# Patient Record
Sex: Male | Born: 1945 | Race: White | Hispanic: No | Marital: Married | Smoking: Never smoker
Health system: Southern US, Community
[De-identification: ages and names within clinical notes are randomized; demographics above are authoritative.]

## PROBLEM LIST (undated history)

## (undated) DIAGNOSIS — E785 Hyperlipidemia, unspecified: Secondary | ICD-10-CM

## (undated) DIAGNOSIS — I471 Supraventricular tachycardia, unspecified: Secondary | ICD-10-CM

## (undated) DIAGNOSIS — R053 Chronic cough: Secondary | ICD-10-CM

## (undated) DIAGNOSIS — R05 Cough: Secondary | ICD-10-CM

## (undated) DIAGNOSIS — Z86010 Personal history of colonic polyps: Secondary | ICD-10-CM

## (undated) DIAGNOSIS — Z950 Presence of cardiac pacemaker: Secondary | ICD-10-CM

## (undated) DIAGNOSIS — I10 Essential (primary) hypertension: Secondary | ICD-10-CM

## (undated) DIAGNOSIS — I2510 Atherosclerotic heart disease of native coronary artery without angina pectoris: Secondary | ICD-10-CM

## (undated) DIAGNOSIS — K219 Gastro-esophageal reflux disease without esophagitis: Secondary | ICD-10-CM

## (undated) DIAGNOSIS — I4891 Unspecified atrial fibrillation: Secondary | ICD-10-CM

## (undated) DIAGNOSIS — I219 Acute myocardial infarction, unspecified: Secondary | ICD-10-CM

## (undated) HISTORY — DX: Supraventricular tachycardia, unspecified: I47.10

## (undated) HISTORY — DX: Gastro-esophageal reflux disease without esophagitis: K21.9

## (undated) HISTORY — DX: Essential (primary) hypertension: I10

## (undated) HISTORY — DX: Cough: R05

## (undated) HISTORY — DX: Atherosclerotic heart disease of native coronary artery without angina pectoris: I25.10

## (undated) HISTORY — DX: Unspecified atrial fibrillation: I48.91

## (undated) HISTORY — DX: Supraventricular tachycardia: I47.1

## (undated) HISTORY — DX: Personal history of colonic polyps: Z86.010

## (undated) HISTORY — DX: Acute myocardial infarction, unspecified: I21.9

## (undated) HISTORY — DX: Hyperlipidemia, unspecified: E78.5

## (undated) HISTORY — DX: Chronic cough: R05.3

---
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
## 2004-05-23 ENCOUNTER — Ambulatory Visit: Payer: Self-pay | Admitting: Internal Medicine

## 2004-05-29 ENCOUNTER — Ambulatory Visit: Payer: Self-pay | Admitting: Internal Medicine

## 2006-02-18 ENCOUNTER — Encounter: Admission: RE | Admit: 2006-02-18 | Discharge: 2006-02-18 | Payer: Self-pay | Admitting: Family Medicine

## 2006-03-07 HISTORY — PX: CORONARY ARTERY BYPASS GRAFT: SHX141

## 2006-03-12 ENCOUNTER — Encounter: Admission: RE | Admit: 2006-03-12 | Discharge: 2006-03-12 | Payer: Self-pay | Admitting: Family Medicine

## 2006-04-01 ENCOUNTER — Ambulatory Visit: Payer: Self-pay | Admitting: Cardiology

## 2006-04-11 ENCOUNTER — Encounter: Payer: Self-pay | Admitting: Cardiology

## 2006-04-11 ENCOUNTER — Ambulatory Visit: Payer: Self-pay

## 2006-04-16 ENCOUNTER — Encounter: Admission: RE | Admit: 2006-04-16 | Discharge: 2006-04-16 | Payer: Self-pay | Admitting: Family Medicine

## 2006-05-01 ENCOUNTER — Encounter (HOSPITAL_COMMUNITY): Admission: RE | Admit: 2006-05-01 | Discharge: 2006-06-20 | Payer: Self-pay | Admitting: Cardiology

## 2006-05-13 ENCOUNTER — Ambulatory Visit: Payer: Self-pay | Admitting: Internal Medicine

## 2006-05-29 ENCOUNTER — Ambulatory Visit: Payer: Self-pay | Admitting: Cardiology

## 2006-05-29 ENCOUNTER — Ambulatory Visit: Payer: Self-pay | Admitting: Internal Medicine

## 2007-06-07 ENCOUNTER — Ambulatory Visit (HOSPITAL_COMMUNITY): Admission: RE | Admit: 2007-06-07 | Discharge: 2007-06-07 | Payer: Self-pay | Admitting: Emergency Medicine

## 2008-04-04 ENCOUNTER — Encounter: Admission: RE | Admit: 2008-04-04 | Discharge: 2008-04-04 | Payer: Self-pay | Admitting: Family Medicine

## 2008-04-14 ENCOUNTER — Ambulatory Visit: Payer: Self-pay | Admitting: Internal Medicine

## 2008-04-14 DIAGNOSIS — E785 Hyperlipidemia, unspecified: Secondary | ICD-10-CM | POA: Insufficient documentation

## 2008-04-14 DIAGNOSIS — J8289 Other pulmonary eosinophilia, not elsewhere classified: Secondary | ICD-10-CM | POA: Insufficient documentation

## 2008-04-14 DIAGNOSIS — J82 Pulmonary eosinophilia, not elsewhere classified: Secondary | ICD-10-CM

## 2008-04-14 DIAGNOSIS — E7800 Pure hypercholesterolemia, unspecified: Secondary | ICD-10-CM | POA: Insufficient documentation

## 2008-04-14 DIAGNOSIS — I213 ST elevation (STEMI) myocardial infarction of unspecified site: Secondary | ICD-10-CM | POA: Insufficient documentation

## 2008-04-15 LAB — CONVERTED CEMR LAB
HCT: 45.8 % (ref 39.0–52.0)
Lymphocytes Relative: 21.6 % (ref 12.0–46.0)
Monocytes Absolute: 0.9 10*3/uL (ref 0.1–1.0)
Monocytes Relative: 7.5 % (ref 3.0–12.0)
Neutro Abs: 7.4 10*3/uL (ref 1.4–7.7)
Neutrophils Relative %: 61.4 % (ref 43.0–77.0)
RBC: 5.03 M/uL (ref 4.22–5.81)
WBC: 12.1 10*3/uL — ABNORMAL HIGH (ref 4.5–10.5)

## 2008-04-28 ENCOUNTER — Ambulatory Visit: Payer: Self-pay | Admitting: Internal Medicine

## 2008-06-24 ENCOUNTER — Ambulatory Visit: Payer: Self-pay | Admitting: Internal Medicine

## 2008-06-24 DIAGNOSIS — J209 Acute bronchitis, unspecified: Secondary | ICD-10-CM | POA: Insufficient documentation

## 2008-07-18 ENCOUNTER — Ambulatory Visit (HOSPITAL_COMMUNITY): Admission: RE | Admit: 2008-07-18 | Discharge: 2008-07-18 | Payer: Self-pay | Admitting: Family Medicine

## 2008-08-04 ENCOUNTER — Telehealth: Payer: Self-pay | Admitting: Cardiology

## 2008-08-29 ENCOUNTER — Ambulatory Visit: Payer: Self-pay | Admitting: Cardiology

## 2008-08-29 ENCOUNTER — Ambulatory Visit: Payer: Self-pay

## 2008-08-29 DIAGNOSIS — R002 Palpitations: Secondary | ICD-10-CM | POA: Insufficient documentation

## 2008-08-29 DIAGNOSIS — I2510 Atherosclerotic heart disease of native coronary artery without angina pectoris: Secondary | ICD-10-CM | POA: Insufficient documentation

## 2008-11-01 ENCOUNTER — Ambulatory Visit: Payer: Self-pay | Admitting: Cardiology

## 2009-09-01 ENCOUNTER — Telehealth: Payer: Self-pay | Admitting: Cardiology

## 2009-09-04 ENCOUNTER — Telehealth: Payer: Self-pay | Admitting: Cardiology

## 2009-10-17 ENCOUNTER — Ambulatory Visit: Payer: Self-pay | Admitting: Cardiology

## 2009-12-11 ENCOUNTER — Ambulatory Visit: Payer: Self-pay | Admitting: Cardiology

## 2010-03-06 NOTE — Progress Notes (Signed)
Summary: pt needs refill toprol dj done  Phone Note Refill Request Call back at Home Phone [phone]  on right aide in [place] / [phone]  Refills Requested: Medication #1:  METOPROLOL SUCCINATE 50 MG XR24H-TAB one by mouth daily Initial call taken by: Omer Jack,  September 01, 2009 1:40 PM    Prescriptions: METOPROLOL SUCCINATE 50 MG XR24H-TAB (METOPROLOL SUCCINATE) one by mouth daily  #30 x 6   Entered by:   Burnett Kanaris, CNA   Authorized by:   Rollene Rotunda, MD, [facility]   Signed by:   Burnett Kanaris, CNA on 09/01/2009   Method used:   Electronically to        [facility]. [phone]* (retail)       [address]       [place], [place]  [address]       Ph: [phone]       Fax: [phone]   RxID:   [id number]

## 2010-03-06 NOTE — Progress Notes (Signed)
Summary: PAIN IN CHEST  Phone Note Call from Patient   Caller: Patient Reason for Call: Talk to Nurse Summary of Call: PT HAVE PAIN ON R SIDE OF CHEST X 2 DAYS, THOUGHT IT WAS A MUSCLE STRAIN, BUT FEELS DIFFERENT TODAY-PLS ADVISE [phone] Initial call taken by: Glynda Jaeger,  September 04, 2009 1:31 PM  Follow-up for Phone Call        chest pain over right breast felt like a pulled muscle, started a couple of days ago, sore to press on it, today started having sob, symptoms are constant, no bruise, no warmness, no injury, he states symptoms get worse w/certain movement and coughting, advised this does not sound cardiac and he should f/u w/pcp pt is agreeable Meredith Staggers, RN  September 04, 2009 1:58 PM

## 2010-03-06 NOTE — Assessment & Plan Note (Signed)
Summary: f1y/dm   Visit Type:  Follow-up Primary Keyona Emrich:  Dr. Dallas Schimke (Urgent Care)  CC:  CAD and palpitations.  History of Present Illness: The patient presents for followup. It has been one year since his last appointment. Since I saw him last he has done well. He does occasionally get tachypalpitations. He notices these at times such as when he's trying to golf. He feels his heart racing. This is the same symptoms we captured on an event monitor. At that time he was felt to have atrial tachycardia. He's not had any presyncope or syncope. He has not had any chest pressure, neck or arm discomfort. He has had no PND or orthopnea. He has had no weight gain or edema. He did stop his Lipitor when he ran out and didn't get her prescription renewed.  Current Medications (verified): 1)  Prevacid 24hr 15 Mg Cpdr (Lansoprazole) .... Take 1 Capsule By Mouth Once A Day 2)  Metoprolol Succinate 50 Mg Xr24h-Tab (Metoprolol Succinate) .... One By Mouth Daily  Allergies (verified): No Known Drug Allergies  Past History:  Past Medical History: Reviewed history from 11/01/2008 and no changes required. HYPERLIPIDEMIA (ICD-272.4) HEART ATTACK (ICD-410.90) Chronic cough..................................................................Marland KitchenWert    - onset 09/2007 Post op atrial fibrillation SVT (Event Monitor 08/2008)  Past Surgical History: Reviewed history from 11/01/2008 and no changes required. Heart Byapss surgery 03/2006 (status post CABG with a LIMA to the LAD,SVG to PDA, SVG to obtuse marginal)  Review of Systems       As stated in the HPI and negative for all other systems.   Vital Signs:  Patient profile:   65 year old male Height:      72 inches Weight:      206 pounds BMI:     28.04 Pulse rate:   62 / minute BP sitting:   104 / 70  Vitals Entered By: Laurance Flatten CMA (October 17, 2009 3:11 PM)  Physical Exam  General:  Well developed, well nourished, in no acute  distress. Head:  normocephalic and atraumatic Eyes:  PERRLA/EOM intact; conjunctiva and lids normal. Mouth:  Teeth, gums and palate normal. Oral mucosa normal. Neck:  Neck supple, no JVD. No masses, thyromegaly or abnormal cervical nodes. Chest Wall:  Well-healed sternotomy scar Lungs:  Clear bilaterally to auscultation and percussion. Abdomen:  Bowel sounds positive; abdomen soft and non-tender without masses, organomegaly, or hernias noted. No hepatosplenomegaly. Msk:  Back normal, normal gait. Muscle strength and tone normal. Extremities:  No clubbing or cyanosis. Neurologic:  Alert and oriented x 3. Skin:  Intact without lesions or rashes. Cervical Nodes:  no significant adenopathy Axillary Nodes:  no significant adenopathy Inguinal Nodes:  no significant adenopathy Psych:  Normal affect.   Detailed Cardiovascular Exam  Neck    Carotids: Carotids full and equal bilaterally without bruits.      Neck Veins: Normal, no JVD.    Heart    Inspection: no deformities or lifts noted.      Palpation: normal PMI with no thrills palpable.      Auscultation: regular rate and rhythm, S1, S2 without murmurs, rubs, gallops, or clicks.    Vascular    Abdominal Aorta: no palpable masses, pulsations, or audible bruits.      Femoral Pulses: normal femoral pulses bilaterally.      Pedal Pulses: normal pedal pulses bilaterally.      Radial Pulses: normal radial pulses bilaterally.      Peripheral Circulation: no clubbing, cyanosis, or edema noted with  normal capillary refill.     EKG  Procedure date:  10/17/2009  Findings:      Sinus rhythm, rate 62, right bundle branch block, no acute ST-T wave changes  Impression & Recommendations:  Problem # 1:  CORONARY ATHEROSCLEROSIS, NATIVE VESSEL (ICD-414.01) The patient is having no new symptoms.  I have asked him to start taking an aspirin 81 mg. No further testing is indicated but he needs more aggressive risk reduction. Toward that end I did  discuss exercise.  Problem # 2:  HYPERLIPIDEMIA (ICD-272.4) Hstopped his Lipitor. I will resume this. I will check a lipid and liver profile in 8 weeks. The goal being LDL less than 100 and HDL greater than 40.  Problem # 3:  PALPITATIONS (ICD-785.1) He does have these occasionally. I prescribed for him immediate release metoprolol to be taken p.r.n. If he is using is frequently he will let me know and I will adjust his medications or consider other evaluation. Orders: EKG w/ Interpretation (93000)  Patient Instructions: 1)  Your physician recommends that you schedule a follow-up appointment in: 1 yr with Dr Antoine Poche 2)  Your physician recommends that you return for a FASTING lipid ad liver profile: in 8 weeks 3)  Your physician has recommended you make the following change in your medication: Re-start Lipitor 20 mg  a day and take metoprolol tartrate 25 mg 1/2 to 1  tablet as needed for palpitations. Prescriptions: LIPITOR 20 MG TABS (ATORVASTATIN CALCIUM) ONE DAILY  #30 x 11   Entered by:   Charolotte Capuchin, RN   Authorized by:   Rollene Rotunda, MD, [facility]   Signed by:   Charolotte Capuchin, RN on 10/17/2009   Method used:   Electronically to        [facility]. [phone]* (retail)       [address]       [place], [place]  [address]       Ph: [phone]       Fax: [phone]   RxID:   [id number] METOPROLOL TARTRATE 25 MG TABS (METOPROLOL TARTRATE) 1/2 TO 1 TABLET AS NEEDED FOR PALPITATIONS  #30 x 6   Entered by:   Charolotte Capuchin, RN   Authorized by:   Rollene Rotunda, MD, [facility]   Signed by:   Charolotte Capuchin, RN on 10/17/2009   Method used:   Electronically to        [facility]. [phone]* (retail)       [address]       [place], [place]  [address]       Ph: [phone]       Fax: [phone]   RxID:   [id number]  I have reviewed and approved all prescriptions at the time of this  visit. Rollene Rotunda, MD, [facility]  October 17, 2009 4:14 PM

## 2010-04-30 ENCOUNTER — Other Ambulatory Visit: Payer: Self-pay | Admitting: Cardiology

## 2010-05-03 NOTE — Telephone Encounter (Signed)
[address] °

## 2010-05-05 ENCOUNTER — Other Ambulatory Visit: Payer: Self-pay

## 2010-05-05 DIAGNOSIS — I10 Essential (primary) hypertension: Secondary | ICD-10-CM

## 2010-05-05 MED ORDER — METOPROLOL SUCCINATE ER 50 MG PO TB24: ORAL_TABLET | ORAL | Status: DC

## 2010-06-03 ENCOUNTER — Other Ambulatory Visit: Payer: Self-pay | Admitting: Cardiology

## 2010-06-08 ENCOUNTER — Other Ambulatory Visit: Payer: Self-pay | Admitting: *Deleted

## 2010-06-22 NOTE — Assessment & Plan Note (Signed)
[facility]                         GASTROENTEROLOGY OFFICE NOTE   NAME:PEEPLESRenn, Dirocco                        MRN:          [id number]  DATE:05/13/2006                            DOB:          [birth year]    REASON FOR CONSULTATION:  Nausea, vomiting, and diarrhea, episodic.  Question relationship to recent myocardial infarction.   ASSESSMENT:  A 65 year old white man that had 3 spells of nausea,  vomiting, and diarrhea of sudden onset.  It occurred about every 2 weeks  or so, and on the last occasion he suffered a myocardial infarction and  underwent coronary artery bypass grafting.  They have not recurred  since.  Etiology has not been clear from workup so far.  A CT scan of  the abdomen and pelvis has shown some minor enlargement of lymph nodes,  not thought to be clinically significant, the anterior gastric wall was  thickened on 1 phase of the study, but it was not persistent.  His  father has a history of carcinoid metastatic to the liver.  His mother  had pancreatic cancer.  His brother had prostate cancer.  He has been  well in between these episodes as well.  There has been some weight loss  since his coronary artery bypass grafting.   It is not clear what the cause of these episodic symptoms are.  It seems  unlikely but not totally impossible that they were some form on angina.  I would have expected an inferior and not an anterior MI, and I think  the diarrhea is unusual.  So far, we have reassuring data, and he had a  colonoscopy performed by me May 29, 2004 that showed mixed hemorrhoids  but was otherwise unrevealing.   PLAN:  1. Upper GI endoscopy.  I think this would be reasonable to look for      any triggers of his nausea and vomiting, or a relationship to the      diarrhea.  We will perform this on all of his medicines.  Risks,      benefits, and indications are reviewed.  He understands and agrees      to proceed.  2. If that  is unrevealing, I would probably sit tight and observe at      this point, and he is comfortable with that plan.  3. He had not yet cut his amiodarone dose as advised by Dr. Antoine Poche,      so I discussed this with him to reduce to 200 mg daily.  4. He is using his Plavix somewhat intermittently, I suggested that he      stay on that on a daily basis at this point because Dr. Jenene Slicker      note did not indicate to take it intermittently, and that that is      unusual.  He should discuss the further use of that with Dr.      Antoine Poche at his followup visit.   History as above.  This man has had 3 episodes of nausea and vomiting  associated with diarrhea that were acute and relatively  severe, lasting  for a few hours.  He does not describe pain with that.  It really  started with nausea, vomiting, chills, diarrhea.  The first one was  after eating in a restaurant.  Six weeks later he had another one which  lasted for several hours, and then he had another episode and was  traveling to [place] on the highway on March 17, 2006, and developed a  spell, and went to the emergency room in [place], [place], where he  received thrombolytics and then went up to [facility] where he had coronary  artery bypass grafting.  He was on a balloon pump.  He had a 3-vessel  graft.  He was presumed to have been in atrial fibrillation since he was  discharged on amiodarone.  He has gotten along well since then.  He has  lost 10 pounds or so since that time.  He denies intermittent symptoms.  He has been placed on some iron for a mildly low hemoglobin.  He had the  CT scan as described above.  I have reviewed those findings.  Small  mesenteric nodes, thought likely to be reactive, some postoperative  changes in the pericardium, bilateral pars defects at L5, right hepatic  lobe cyst 5 mm.   Note, stools are somewhat dark on the iron and were not prior.  There  has been no evidence of bleeding.  GI, review of  systems, otherwise  negative.   PAST MEDICAL HISTORY:  1. Coronary artery bypass grafting, coronary artery disease with      myocardial infarction as above.  2. Question atrial fibrillation.  3. History of paroxysmal supraventricular tachycardia.  4. Dyslipidemia.  5. Hypertension.  6. Previous hernia repair.   MEDICATIONS:  1. Iron twice daily.  2. Amiodarone 400 mg daily.  3. Vitamin C daily.  4. Aspirin 81 mg daily.  5. Metoprolol 12.5 mg twice daily.  6. Potassium 20 mg daily.  7. Furosemide 20 mg daily.  8. Simvastatin 40 daily.  9. Advil p.r.n.  10.__________  juice intermittently.  11.Plavix intermittently.   DRUG ALLERGIES:  None known.   FAMILY HISTORY:  As summarized above.  His father did not have  adenocarcinoma of the colon, the patient indicates he had carcinoid of  the colon metastatic to the liver.   SOCIAL HISTORY:  Patient is divorced.  He is a Copywriter, advertising.  He lives  alone.  One son, one daughter.  Uses some alcohol, no tobacco or drugs.   REVIEW OF SYSTEMS:  As reflected in my medical history form, he  indicates  swollen lymph nodes as noted on his CT.   PHYSICAL EXAMINATION:  Reveals him to be 6 feet tall, weight 191.8  pounds, blood pressure 100/62, pulse 80 irregular.  EYES:  Anicteric.  LUNGS:  Clear.  NECK:  Supple, no thyromegaly or mass.  MOUTH:  Free of lesions.  CHEST WALL:  Notable for sternotomy scar, healing well, nontender.  ABDOMEN:  Soft, nontender, without organomegaly or masses.  EXTREMITIES:  Free of edema in the lower extremities.  SKIN:  Warm and dry, no acute rash in the upper trunk.  NEURO/PSYCH:  He is alert and oriented x3.  LYMPH NODES:  I detect no neck or supraclavicular nodes.   LABORATORY DATA:  From Dr. Geoffery Lyons office is reviewed, as well as CT  report in Dr. Geoffery Lyons office notes.  On March 11, he had a chloride  of 112, a sodium of 149, but otherwise normal  electrolytes. Triglycerides 158, HDL 33, hemoglobin  was 11.9 with low normal 12.5,  platelets 471, MCV is 94.  H. Pylori antibodies and amylase, lipase  normal.  TSH normal.  CBC, CMET, all normal.  In July of 2007, TSH  normal then.   Note, if he were to have persistent symptoms, I think workup for  carcinoid is reasonable, though I would expect a more chronic diarrhea.  We will see what the upper endoscopy shows.  I certainly think that the  stress of his nausea, vomiting, and diarrhea could have lead to  heightened inflammatory state in the body, and helped precipitate an  myocardial infarction.  It is hard to know, really, but that is  plausible.   I appreciate the opportunity to care for this patient.     Iva Boop, MD,FACG  Electronically Signed    CEG/MedQ  DD: 05/13/2006  DT: 05/13/2006  Job #: [id number]   cc:   Mosetta Putt, M.D.  Rollene Rotunda, MD, [facility]

## 2010-06-22 NOTE — Assessment & Plan Note (Signed)
[facility]                            CARDIOLOGY OFFICE NOTE   Steven Mathews, Steven Mathews                        MRN:          [id number]  DATE:04/01/2006                            DOB:          08/16/1945    PRIMARY CARE PHYSICIAN:  Mosetta Putt, M.D.   REASON FOR REFERRAL:  Evaluate patient with a recent anterior myocardial  infarction and bypass.   HISTORY OF PRESENT ILLNESS:  The patient is a pleasant 65 year old  gentleman without prior coronary disease history but a long history of  dyslipidemia.  He does have a history of paroxysmal supraventricular  tachycardia treated with verapamil.   He was in his state of usual health until March 14, 2006.  He was  driving to [place] and was in [place] when he developed an episode  of nausea, vomiting and diarrhea.  He had had these episodes a couple of  times over the previous 45 days; however, as he drove along he got worse  and developed chest and arm discomfort.  I do not have all of the  details, but he apparently pulled into a small emergency room and was  found to be having a myocardial infarction, apparently anterior.  He  reports being treated with thrombolytics.  He was then sent [facility].  He had a cardiac catheterization that demonstrated multivessel  disease with thrombus in the LAD and distal apical thrombus.  He  required a balloon pump.  I do not have all the details of his anatomy,  but do know that he underwent CABG on March 17, 2006 with a LIMA to  the LAD, SVG to the PDA and SVG to the obtuse marginal.  I do not have a  description of his ejection fraction.  He apparently was also found to  be in atrial fibrillation as he was discharged on amiodarone.   He now presents to establish with a cardiologist in this area.  He said  that since surgery he has done very well.  He has had none of the nausea  or vomiting.  He has had no diarrhea.  In particular, he has had none  of  the chest discomfort he had with his acute MI.  He has had no neck or  arm discomfort.  He has had no shortness of breath.  He denies any  cough.  He has had some slight chills, but not any documented fevers.  He has been eating well.  He has been walking.  He has not noticed any  palpitations, presyncope or syncope.   PAST MEDICAL HISTORY:  1. Hyperlipidemia.  2. Hypertension.  3. Newly-diagnosed coronary disease.  4. PSVT.   PAST SURGICAL HISTORY:  1. CABG.  2. Hernia repair.   ALLERGIES:  None.   CURRENT MEDICATIONS:  1. Percocet p.r.n.  2. Amiodarone 40 mg daily.  3. Ascorbic acid.  4. Aspirin 81 mg daily.  5. Iron.  6. Metoprolol 12.5 mg b.i.d.  7. Multivitamin.  8. Nitroglycerin.  9. Advil.  10.Plavix 75 mg daily.  11.Potassium 20 mEq daily.  12.Simvastatin  40 mg daily.  13.Lasix.   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  He has 2  adult children.  He was smoking a few cigarettes recently, but does not  have a long history.   FAMILY HISTORY:  Noncontributory for early coronary artery disease.   REVIEW OF SYSTEMS:  Positive for reflux and palpitations.  Otherwise, as  stated in the HPI.  Negative for other systems.   PHYSICAL EXAMINATION:  GENERAL:  The patient is in no distress.  VITAL SIGNS:  Blood pressure 105/59, heart rate 70 and regular, weight  190 pounds, body mass index 25.  HEENT:  Eyes unremarkable.  Pupils equal, round and reactive to light.  Fundi not visualized.  Oral mucosa unremarkable.  NECK:  No jugular venous distention at 45 degrees.  Carotid upstrokes  brisk and symmetric.  No bruits, no thyromegaly.  LYMPHATICS:  No cervical, axillary or inguinal adenopathy.  LUNGS:  Clear to auscultation bilaterally.  BACK:  No costovertebral angle tenderness.  CHEST:  Unremarkable, except for a well-healed sternotomy scar.  HEART:  PMI not displaced or sustained.  S1 and S2 within normal limits.  No S3, no S4, no clicks, rubs  or murmurs.  ABDOMEN:  Flat, positive bowel sounds.  Normal in frequency and pitch.  No bruits, rebound or guarding.  No midline pulse.  No mass.  No  hepatomegaly or splenomegaly.  SKIN:  No rashes, no nodules.  EXTREMITIES:  There were 2+ pulses throughout.  No edema, no cyanosis no  clubbing.  NEUROLOGIC:  Oriented to person, place, and time.  Cranial nerves II-XII  grossly intact.  Motor grossly intact.   ELECTROCARDIOGRAM:  Sinus rhythm, chronic right bundle branch block,  left axis deviation, anterolateral T wave inversions.   CHEST X-RAY:  Status post CABG with no acute airspace disease.   ASSESSMENT AND PLAN:  1. Coronary disease.  The patient is having no new symptoms.  He needs      aggressive risk reduction as described below.  I have referred him      to cardiac rehabilitation, which he is interested in doing.  He is      going to avoid cigarettes.  2. Risk reduction.  He will have his lipids followed by Dr. Duaine Dredge.      I would work aggressively with perhaps combination therapy as      needed to get his HDL into the 40s and his LDL less than 70, given      his absence of other significant risk factors.  3. Atrial fibrillation.  I suspect that he has had atrial      fibrillation, though I do not see documentation of this.  Will try      to get other records.  I am going to reduce his amiodarone to 200      mg a day.  Eventually will get rid of this if he has no other      symptomatic tachyarrhythmias.  I probably will try to use beta      blockers for paroxysmal supraventricular tachycardia, going      forward, given the added benefit post myocardial infarction.  4. Paroxysmal supraventricular tachycardia as above.  5. Medications.  I will discontinue his Lasix and his potassium.  He      has been given written instructions to get a BMET, lipid and liver      profile at Dr. Geoffery Lyons office in 2 weeks. 6.  Follow up.  I would like to see him back in 2 months, at which  time      I will most likely discontinue the amiodarone.     Rollene Rotunda, MD, [facility]  Electronically Signed    JH/MedQ  DD: 04/01/2006  DT: 04/01/2006  Job #: [id number]   cc:   Mosetta Putt, M.D.

## 2010-06-22 NOTE — Assessment & Plan Note (Signed)
[facility]                            CARDIOLOGY OFFICE NOTE   NAME:[name], Steven                        MRN:          [id number]  DATE:05/29/2006                            DOB:          [birth year]    REASON FOR PRESENTATION:  Evaluate patient with coronary artery disease.   HISTORY OF PRESENT ILLNESS:  Patient is a pleasant 65 year old gentleman  with coronary disease as described in the previous note.  He is status  post CABG in February.  He has done well since that time.  He has not  been able to go to cardiac rehab because of his schedule.  He made  through a couple of sessions.  He is trying to do exercising on his own.  He is quite active at work.  He denies any chest discomfort, neck or arm  discomfort.  He has had no palpitations, presyncope, or syncope.  He had  no PND or orthopnea.  Because of his previous GI complaints, he is  seeing Dr. Leone Payor, and is due to have an EGD today.  He thinks he  stopped taking the simvastatin, and does not have it on his list.  He  does not recall having any problems with it.   In particular, the patient has not had any palpitations, presyncope, or  syncope.  At the last visit, I reduced his amiodarone.   PAST MEDICAL HISTORY:  1. Coronary artery disease (status post CABG with a LIMA to the LAD,      SVG to PDA, SVG to obtuse marginal).  2. Hyperlipidemia.  3. Hypertension.  4. PSVT.  5. Hernia repair.   ALLERGIES:  NONE.   MEDICATIONS:  1. Iron.  2. Vitamin C.  3. Aspirin 81 mg daily.  4. Metoprolol 12.5 mg b.i.d.  5. Simvastatin 40 mg daily (he has not been taking this).  6. Amiodarone 200 mg daily.  7. Plavix 75 mg daily.   REVIEW OF SYSTEMS:  As stated in the HPI, and otherwise negative for  other systems.   PHYSICAL EXAMINATION:  The patient is in no distress.  Blood pressure 121/82.  Heart rate 72 and regular.  HEENT:  Eyes unremarkable.  Pupils equal, round, and reactive to  light.  Fundi not visualized. Oral mucosa unremarkable.  NECK:  No jugular venous distention.  Wave form within normal limits.  Carotid upstroke brisk and symmetric.  No bruits.  No thyromegaly.  LYMPHATICS:  No cervical, axillary, or inguinal adenopathy.  LUNGS:  Clear to auscultation bilaterally.  BACK:  No costovertebral angle tenderness.  CHEST:  Unremarkable.  HEART:  PMI not displaced or sustained.  S1 and S2 within normal limits.  No S3.  No S4.  No clicks, no rubs, no murmurs.  ABDOMEN:  Flat.  Positive bowel sounds.  Normal in frequency and pitch.  No bruits.  No rebound.  No guarding.  No midline pulsatile mass.  No  organomegaly.  SKIN:  No rashes.  No nodules.  EXTREMITIES:  Two plus pulses.  No edema.   ASSESSMENT AND PLAN:  1. Coronary artery disease.  Patient is having no new symptoms.  I      encouraged him to do cardiac rehab, or at least to do it at home.      We discussed the goals, and he will start his own exercise regimen      3 to 5 days per week.  I recommend 5.  He will continue with      secondary risk reduction.  He is going to switch to Toprol XL for      convenience.  2. Arrhythmia.  The patient is no longer having any symptomatic      arrhythmias.  I am going to stop his amiodarone.  If he has any      recurrence of his previous symptoms, we will probably increase his      beta blocker, or restart verapamil.  3. Dyslipidemia.  I have given him another prescription for his      simvastatin.  He understands that in 6 weeks he needs to go to Dr.      Duaine Dredge fasting, and get a lipid profile.  4. Followup.  I will see him back in 6 months, and probably yearly      thereafter as long as he is doing well.     Rollene Rotunda, MD, [facility]  Electronically Signed    JH/MedQ  DD: 05/29/2006  DT: 05/29/2006  Job #: [id number]   cc:   Mosetta Putt, M.D.

## 2010-07-03 ENCOUNTER — Other Ambulatory Visit: Payer: Self-pay | Admitting: Cardiology

## 2010-08-05 ENCOUNTER — Other Ambulatory Visit: Payer: Self-pay | Admitting: Cardiology

## 2010-08-22 ENCOUNTER — Telehealth: Payer: Self-pay | Admitting: Cardiology

## 2010-08-22 MED ORDER — ATORVASTATIN CALCIUM 20 MG PO TABS: 20.0000 mg | ORAL_TABLET | Freq: Every day | ORAL | Status: DC

## 2010-08-22 NOTE — Telephone Encounter (Signed)
Pt called and has been out of Lipitor for some time.  Please call a pres  Into the phar that is on file.  If questions call patient back.  He could not remember name of Phar.  [phone]

## 2010-09-06 ENCOUNTER — Encounter: Payer: Self-pay | Admitting: Cardiology

## 2010-10-05 ENCOUNTER — Ambulatory Visit (INDEPENDENT_AMBULATORY_CARE_PROVIDER_SITE_OTHER): Payer: Medicare Other | Admitting: Cardiology

## 2010-10-05 ENCOUNTER — Encounter: Payer: Self-pay | Admitting: Cardiology

## 2010-10-05 DIAGNOSIS — E785 Hyperlipidemia, unspecified: Secondary | ICD-10-CM

## 2010-10-05 DIAGNOSIS — I2510 Atherosclerotic heart disease of native coronary artery without angina pectoris: Secondary | ICD-10-CM

## 2010-10-05 DIAGNOSIS — N529 Male erectile dysfunction, unspecified: Secondary | ICD-10-CM | POA: Insufficient documentation

## 2010-10-05 DIAGNOSIS — R002 Palpitations: Secondary | ICD-10-CM

## 2010-10-05 MED ORDER — TADALAFIL 10 MG PO TABS: 10.0000 mg | ORAL_TABLET | Freq: Every day | ORAL | Status: DC | PRN

## 2010-10-05 NOTE — Assessment & Plan Note (Signed)
I will prescribe Cialis.

## 2010-10-05 NOTE — Progress Notes (Signed)
HPI The patient presents for evaluation of CAD.  Since I last saw him he has done well. The patient denies any new symptoms such as chest discomfort, neck or arm discomfort. There has been no new shortness of breath, PND or orthopnea. There has been no syncope.  He does have palpitations.  These occur almost daily. He describes irregular heartbeats it may last for sometime and makes him somewhat lightheaded. He does not associate this with any particular activity. He does think they're increasing in frequency.  He exercises one day per week.  No Known Allergies  Current Outpatient Prescriptions  Medication Sig Dispense Refill  . aspirin 81 MG tablet Take 81 mg by mouth daily.        Marland Kitchen atorvastatin (LIPITOR) 20 MG tablet Take 1 tablet (20 mg total) by mouth daily.  30 tablet  4  . lansoprazole (PREVACID) 15 MG capsule Take 15 mg by mouth daily.        . metoprolol (TOPROL-XL) 50 MG 24 hr tablet Take 1/2 to 1 whole tab daily as needed for palps  30 tablet  0    Past Medical History  Diagnosis Date  . Hyperlipidemia   . Heart attack   . Chronic cough   . Atrial fibrillation   . SVT (supraventricular tachycardia)     Past Surgical History  Procedure Date  . 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  Otherwise as stated in the HPI and negative for all other systems.  PHYSICAL EXAM BP 117/75  Pulse 66  Ht 6' (1.829 m)  Wt 198 lb (89.812 kg)  BMI 26.85 kg/m2 GENERAL:  Well appearing HEENT:  Pupils equal round and reactive, fundi not visualized, oral mucosa unremarkable NECK:  No jugular venous distention, waveform within normal limits, carotid upstroke brisk and symmetric, no bruits, no thyromegaly LYMPHATICS:  No cervical, inguinal adenopathy LUNGS:  Clear to auscultation bilaterally BACK:  No CVA tenderness CHEST:  Unremarkable HEART:  PMI not displaced or sustained,S1 and S2 within normal limits, no S3, no S4, no clicks, no rubs, no murmurs ABD:  Flat, positive bowel  sounds normal in frequency in pitch, no bruits, no rebound, no guarding, no midline pulsatile mass, no hepatomegaly, no splenomegaly EXT:  2 plus pulses throughout, no edema, no cyanosis no clubbing SKIN:  No rashes no nodules NEURO:  Cranial nerves II through XII grossly intact, motor grossly intact throughout PSYCH:  Cognitively intact, oriented to person place and time  EKG: Normal sinus rhythm, right bundle branch block, rate 66  ASSESSMENT AND PLAN

## 2010-10-05 NOTE — Patient Instructions (Addendum)
Your physician has recommended that you wear a holter monitor for  48 hours. Holter monitors are medical devices that record the heart's electrical activity. Doctors most often use these monitors to diagnose arrhythmias. Arrhythmias are problems with the speed or rhythm of the heartbeat. The monitor is a small, portable device. You can wear one while you do your normal daily activities. This is usually used to diagnose what is causing palpitations/syncope (passing out).  Please have a fasting lipid and liver profile the same day you come in for your monitor. Do not eat or drink afternoon the morning you come in.  Continue all medications as listed.  Follow up in 1 year with Dr Antoine Poche.  You will receive a letter in the mail 2 months before you are due.  Please call us when you receive this letter to schedule your follow up appointment.

## 2010-10-05 NOTE — Assessment & Plan Note (Signed)
I will try to have him wear a 48 hour monitor so that I can understand he is better. For now we will continue the meds as listed including p.r.n. Beta blocker.

## 2010-10-05 NOTE — Assessment & Plan Note (Signed)
The patient has no new sypmtoms.  No further cardiovascular testing is indicated.  We will continue with aggressive risk reduction and meds as listed.  

## 2010-10-05 NOTE — Assessment & Plan Note (Signed)
He was off Lipitor for awhile. He will come back for a fasting lipid profile. The goal will be an LDL less than 70 and HDL greater than 40.

## 2010-10-12 ENCOUNTER — Other Ambulatory Visit: Payer: Self-pay | Admitting: Cardiology

## 2010-10-12 DIAGNOSIS — N529 Male erectile dysfunction, unspecified: Secondary | ICD-10-CM

## 2010-10-12 MED ORDER — TADALAFIL 10 MG PO TABS: 10.0000 mg | ORAL_TABLET | Freq: Every day | ORAL | Status: AC | PRN

## 2010-12-25 ENCOUNTER — Other Ambulatory Visit: Payer: Self-pay | Admitting: Cardiology

## 2011-01-09 ENCOUNTER — Ambulatory Visit (INDEPENDENT_AMBULATORY_CARE_PROVIDER_SITE_OTHER): Payer: Medicare Other

## 2011-01-09 DIAGNOSIS — Z23 Encounter for immunization: Secondary | ICD-10-CM

## 2011-02-12 ENCOUNTER — Other Ambulatory Visit: Payer: Self-pay | Admitting: Cardiology

## 2011-08-09 ENCOUNTER — Other Ambulatory Visit: Payer: Self-pay | Admitting: Cardiology

## 2011-08-09 NOTE — Telephone Encounter (Signed)
-   refilled atorvastatin 

## 2011-09-11 ENCOUNTER — Other Ambulatory Visit: Payer: Self-pay | Admitting: Cardiology

## 2011-09-11 NOTE — Telephone Encounter (Signed)
..   Requested Prescriptions   Pending Prescriptions Disp Refills  . metoprolol succinate (TOPROL-XL) 50 MG 24 hr tablet [Pharmacy Med Name: METOPROLOL ER SUCCINATE 50MG  TABS] 30 tablet 6    Sig: TAKE 1/2 TO 1 TABLET BY MOUTH DAILY AS NEEDED FOR PALPATATIONS

## 2011-11-22 ENCOUNTER — Other Ambulatory Visit: Payer: Self-pay

## 2011-11-22 MED ORDER — ATORVASTATIN CALCIUM 20 MG PO TABS: 20.0000 mg | ORAL_TABLET | Freq: Every day | ORAL | Status: DC

## 2011-11-22 NOTE — Telephone Encounter (Signed)
..   Requested Prescriptions   Pending Prescriptions Disp Refills  . atorvastatin (LIPITOR) 20 MG tablet 30 tablet 6    Sig: Take 1 tablet (20 mg total) by mouth daily.

## 2012-04-13 ENCOUNTER — Other Ambulatory Visit: Payer: Self-pay | Admitting: Cardiology

## 2012-05-20 ENCOUNTER — Other Ambulatory Visit: Payer: Self-pay | Admitting: *Deleted

## 2012-05-20 MED ORDER — METOPROLOL SUCCINATE ER 50 MG PO TB24: ORAL_TABLET | ORAL | Status: DC

## 2012-06-12 ENCOUNTER — Ambulatory Visit (INDEPENDENT_AMBULATORY_CARE_PROVIDER_SITE_OTHER): Payer: Medicare Other | Admitting: Cardiology

## 2012-06-12 ENCOUNTER — Encounter: Payer: Self-pay | Admitting: Cardiology

## 2012-06-12 VITALS — BP 122/82 | HR 69 | Ht 72.0 in | Wt 211.8 lb

## 2012-06-12 DIAGNOSIS — I219 Acute myocardial infarction, unspecified: Secondary | ICD-10-CM

## 2012-06-12 DIAGNOSIS — I2510 Atherosclerotic heart disease of native coronary artery without angina pectoris: Secondary | ICD-10-CM

## 2012-06-12 DIAGNOSIS — Z79899 Other long term (current) drug therapy: Secondary | ICD-10-CM

## 2012-06-12 DIAGNOSIS — E7800 Pure hypercholesterolemia, unspecified: Secondary | ICD-10-CM

## 2012-06-12 DIAGNOSIS — R002 Palpitations: Secondary | ICD-10-CM

## 2012-06-12 MED ORDER — ATORVASTATIN CALCIUM 20 MG PO TABS: 20.0000 mg | ORAL_TABLET | Freq: Every day | ORAL | Status: DC

## 2012-06-12 MED ORDER — METOPROLOL SUCCINATE ER 50 MG PO TB24: ORAL_TABLET | ORAL | Status: DC

## 2012-06-12 NOTE — Progress Notes (Signed)
   HPI The patient presents for evaluation of CAD.  Since I last saw him he has had increasing palpitaitons.  These are happening almost every day. He'll feel in irregular heart rate for sometimes a couple of hours. He feels drained with this. He has not had any frank syncope or presyncope.  He does not associate this with any particular activity. The patient denies any new symptoms such as chest discomfort, neck or arm discomfort. There has been no new shortness of breath, PND or orthopnea.  He does exercise routinely.  No Known Allergies  Current Outpatient Prescriptions  Medication Sig Dispense Refill  . aspirin 81 MG tablet Take 81 mg by mouth daily.        Marland Kitchen atorvastatin (LIPITOR) 20 MG tablet Take 1 tablet (20 mg total) by mouth daily.  30 tablet  6  . lansoprazole (PREVACID) 15 MG capsule Take 15 mg by mouth daily.        . metoprolol succinate (TOPROL-XL) 50 MG 24 hr tablet Take 1/2 to 1 tablet by mouth daily as needed for palpitations  30 tablet  0   No current facility-administered medications for this visit.    Past Medical History  Diagnosis Date  . Hyperlipidemia   . Heart attack   . Chronic cough   . Atrial fibrillation   . SVT (supraventricular tachycardia)     Past Surgical History  Procedure Laterality Date  . Coronary artery bypass graft  03/2006    ROS:  As stated in the HPI and negative for all other systems.  PHYSICAL EXAM BP 122/82  Pulse 69  Ht 6' (1.829 m)  Wt 211 lb 12.8 oz (96.072 kg)  BMI 28.72 kg/m2 GENERAL:  Well appearing HEENT:  Pupils equal round and reactive, fundi not visualized, oral mucosa unremarkable NECK:  No jugular venous distention, waveform within normal limits, carotid upstroke brisk and symmetric, no bruits, no thyromegaly LYMPHATICS:  No cervical, inguinal adenopathy LUNGS:  Clear to auscultation bilaterally BACK:  No CVA tenderness CHEST: Well healed sternotomy scar. HEART:  PMI not displaced or sustained,S1 and S2 within  normal limits, no S3, no S4, no clicks, no rubs, no murmurs ABD:  Flat, positive bowel sounds normal in frequency in pitch, no bruits, no rebound, no guarding, no midline pulsatile mass, no hepatomegaly, no splenomegaly EXT:  2 plus pulses throughout, no edema, no cyanosis no clubbing SKIN:  No rashes no nodules NEURO:  Cranial nerves II through XII grossly intact, motor grossly intact throughout PSYCH:  Cognitively intact, oriented to person place and time  EKG: Normal sinus rhythm, right bundle branch block, rate 69  ASSESSMENT AND PLAN  CAD:  The patient has no new sypmtoms.  No further cardiovascular testing is indicated.  We will continue with aggressive risk reduction and meds as listed.  HTN:  The blood pressure is at target. No change in medications is indicated. We will continue with therapeutic lifestyle changes (TLC).  HYPERLIPIDEMIA:  I will check a lipid level.  He is not quite at target level of medicine but I would adjust this based on the upcoming reading.  PALPITATIONS:  He is having a consistent dysrhythmia and I will apply a 48 hour Holter monitor. Further management will be based on this.

## 2012-06-12 NOTE — Patient Instructions (Addendum)
The current medical regimen is effective;  continue present plan and medications.  Please return for fasting blood work. (lipid and liver)  Your physician has recommended that you wear a holter monitor for 48 hours. Holter monitors are medical devices that record the heart's electrical activity. Doctors most often use these monitors to diagnose arrhythmias. Arrhythmias are problems with the speed or rhythm of the heartbeat. The monitor is a small, portable device. You can wear one while you do your normal daily activities. This is usually used to diagnose what is causing palpitations/syncope (passing out).  Follow up with Dr Antoine Poche after testing.  (OK TO DOUBLE BOOK PER DR HOCHREIN)

## 2012-06-16 ENCOUNTER — Other Ambulatory Visit (INDEPENDENT_AMBULATORY_CARE_PROVIDER_SITE_OTHER): Payer: Medicare Other

## 2012-06-16 ENCOUNTER — Telehealth: Payer: Self-pay | Admitting: *Deleted

## 2012-06-16 ENCOUNTER — Encounter (INDEPENDENT_AMBULATORY_CARE_PROVIDER_SITE_OTHER): Payer: Medicare Other

## 2012-06-16 DIAGNOSIS — R002 Palpitations: Secondary | ICD-10-CM

## 2012-06-16 DIAGNOSIS — Z79899 Other long term (current) drug therapy: Secondary | ICD-10-CM

## 2012-06-16 DIAGNOSIS — E7800 Pure hypercholesterolemia, unspecified: Secondary | ICD-10-CM

## 2012-06-16 LAB — HEPATIC FUNCTION PANEL
ALT: 35 U/L (ref 0–53)
AST: 43 U/L — ABNORMAL HIGH (ref 0–37)
Bilirubin, Direct: 0.1 mg/dL (ref 0.0–0.3)
Total Bilirubin: 0.7 mg/dL (ref 0.3–1.2)

## 2012-06-16 LAB — LIPID PANEL: Triglycerides: 245 mg/dL — ABNORMAL HIGH (ref 0.0–149.0)

## 2012-06-16 NOTE — Telephone Encounter (Signed)
48 Hour Holter monitor placed on patient 06-16-2012. sw

## 2012-06-25 ENCOUNTER — Ambulatory Visit (INDEPENDENT_AMBULATORY_CARE_PROVIDER_SITE_OTHER): Payer: Medicare Other | Admitting: Cardiology

## 2012-06-25 ENCOUNTER — Encounter: Payer: Self-pay | Admitting: Cardiology

## 2012-06-25 VITALS — BP 124/81 | HR 68 | Ht 72.0 in | Wt 211.8 lb

## 2012-06-25 DIAGNOSIS — R002 Palpitations: Secondary | ICD-10-CM

## 2012-06-25 DIAGNOSIS — E785 Hyperlipidemia, unspecified: Secondary | ICD-10-CM

## 2012-06-25 DIAGNOSIS — I2510 Atherosclerotic heart disease of native coronary artery without angina pectoris: Secondary | ICD-10-CM

## 2012-06-25 DIAGNOSIS — I498 Other specified cardiac arrhythmias: Secondary | ICD-10-CM

## 2012-06-25 DIAGNOSIS — I471 Supraventricular tachycardia: Secondary | ICD-10-CM

## 2012-06-25 MED ORDER — METOPROLOL SUCCINATE ER 50 MG PO TB24: 50.0000 mg | ORAL_TABLET | Freq: Two times a day (BID) | ORAL | Status: DC

## 2012-06-25 MED ORDER — ATORVASTATIN CALCIUM 40 MG PO TABS: 40.0000 mg | ORAL_TABLET | Freq: Every day | ORAL | Status: DC

## 2012-06-25 NOTE — Progress Notes (Signed)
   HPI The patient presents for evaluation of CAD. At the last visit he was having increasing palpitaitons.  He has not had any frank syncope or presyncope.  He does not associate this with any particular activity. The patient denies any new symptoms such as chest discomfort, neck or arm discomfort. There has been no new shortness of breath, PND or orthopnea.  I placed a a 48-hour Holter which demonstrated recurrent supraventricular tachycardia. He also had some nonsustained wide complex tachycardia.  He reports getting this more frequently. He feels drained and lightheaded but has not had any presyncope or syncope. It can happen with activity but also happens at rest.  No Known Allergies  Current Outpatient Prescriptions  Medication Sig Dispense Refill  . aspirin 81 MG tablet Take 81 mg by mouth daily.        Marland Kitchen atorvastatin (LIPITOR) 20 MG tablet Take 1 tablet (20 mg total) by mouth daily.  30 tablet  11  . lansoprazole (PREVACID) 15 MG capsule Take 15 mg by mouth daily.        . metoprolol succinate (TOPROL-XL) 50 MG 24 hr tablet Take 1/2 to 1 tablet by mouth daily as needed for palpitations  30 tablet  11   No current facility-administered medications for this visit.    Past Medical History  Diagnosis Date  . Hyperlipidemia   . Heart attack   . Chronic cough   . Atrial fibrillation   . SVT (supraventricular tachycardia)     Past Surgical History  Procedure Laterality Date  . Coronary artery bypass graft  03/2006    ROS:  As stated in the HPI and negative for all other systems.  PHYSICAL EXAM BP 124/81  Pulse 68  Ht 6' (1.829 m)  Wt 211 lb 12.8 oz (96.072 kg)  BMI 28.72 kg/m2 GENERAL:  Well appearing NECK:  No jugular venous distention, waveform within normal limits, carotid upstroke brisk and symmetric, no bruits, no thyromegaly LUNGS:  Clear to auscultation bilaterally CHEST: Well healed sternotomy scar. HEART:  PMI not displaced or sustained,S1 and S2 within normal  limits, no S3, no S4, no clicks, no rubs, no murmurs ABD:  Flat, positive bowel sounds normal in frequency in pitch, no bruits, no rebound, no guarding, no midline pulsatile mass, no hepatomegaly, no splenomegaly EXT:  2 plus pulses throughout, no edema, no cyanosis no clubbing   ASSESSMENT AND PLAN  SVT:  Given the results of the monitor I will increase his metoprolol to 100 mg daily and schedule him to see EEG to consider ablation. I will check an echocardiogram.  CAD:  The patient has no new sypmtoms.  No further cardiovascular testing is indicated.  We will continue with aggressive risk reduction and meds as listed.  HTN:  The blood pressure is at target. No change in medications is indicated. We will continue with therapeutic lifestyle changes (TLC).  HYPERLIPIDEMIA:  His triglycerides were elevated when I drew his labs recently. His LDL was 114.  I will increase the Lipitor to 40 mg daily.

## 2012-06-25 NOTE — Patient Instructions (Addendum)
Please increase your Atorvastatin to 40 mg a day Increase your Metoprolol to 50 mg one twice a day. Continue all other medications as listed.  Your physician has requested that you have an echocardiogram. Echocardiography is a painless test that uses sound waves to create images of your heart. It provides your doctor with information about the size and shape of your heart and how well your heart's chambers and valves are working. This procedure takes approximately one hour. There are no restrictions for this procedure.  You have been referred to EP for evaluation of SVT.  Follow up with Dr Antoine Poche after seeing EP.

## 2012-07-07 ENCOUNTER — Ambulatory Visit (HOSPITAL_COMMUNITY): Payer: Medicare Other | Attending: Cardiology | Admitting: Radiology

## 2012-07-07 DIAGNOSIS — R002 Palpitations: Secondary | ICD-10-CM | POA: Insufficient documentation

## 2012-07-07 DIAGNOSIS — I2581 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: Secondary | ICD-10-CM

## 2012-07-07 NOTE — Progress Notes (Signed)
Echocardiogram performed.  

## 2012-07-16 ENCOUNTER — Ambulatory Visit (INDEPENDENT_AMBULATORY_CARE_PROVIDER_SITE_OTHER): Payer: Medicare Other | Admitting: Internal Medicine

## 2012-07-16 ENCOUNTER — Encounter: Payer: Self-pay | Admitting: Internal Medicine

## 2012-07-16 VITALS — BP 115/75 | HR 63 | Ht 72.0 in | Wt 207.0 lb

## 2012-07-16 DIAGNOSIS — I2510 Atherosclerotic heart disease of native coronary artery without angina pectoris: Secondary | ICD-10-CM

## 2012-07-16 DIAGNOSIS — R002 Palpitations: Secondary | ICD-10-CM

## 2012-08-04 ENCOUNTER — Encounter: Payer: Self-pay | Admitting: Internal Medicine

## 2012-08-04 NOTE — Assessment & Plan Note (Signed)
He has documented SVT. I have discussed the treatment options with the patient and the risks/benefits/goals/expectations of medical therapy and catheter ablation have been reviewed and wishes to continue medical therapy. If he has recurrent SVT, he would reconsider catheter ablation.

## 2012-08-04 NOTE — Progress Notes (Signed)
HPI Steven Mathews is referred by Dr. Antoine Poche for evaluation of SVT. He is a pleasant 67 yo man with a h/o CAD and HTN. He has had recurrent chest pressure and evaluation demonstrated SVT, which was prlonged and reproduced his symptoms. He has minimal if any palpitations. He has not had syncope. He was place on Toprol and his symptoms have improved. The patient denies dyspnea with exertion. No edema. His ECG during SVT demonstrated a narrow complex tachy at 170/min. No Known Allergies   Current Outpatient Prescriptions  Medication Sig Dispense Refill  . aspirin 81 MG tablet Take 81 mg by mouth daily.        Marland Kitchen atorvastatin (LIPITOR) 40 MG tablet Take 1 tablet (40 mg total) by mouth daily.  30 tablet  11  . lansoprazole (PREVACID) 15 MG capsule Take 15 mg by mouth daily.        . metoprolol succinate (TOPROL-XL) 50 MG 24 hr tablet Take 1 tablet (50 mg total) by mouth 2 (two) times daily.  60 tablet  11   No current facility-administered medications for this visit.     Past Medical History  Diagnosis Date  . Hyperlipidemia   . CAD (coronary artery disease)   . Chronic cough   . Atrial fibrillation   . SVT (supraventricular tachycardia)     ROS:   All systems reviewed and negative except as noted in the HPI.   Past Surgical History  Procedure Laterality Date  . Coronary artery bypass graft  03/2006     Family History  Problem Relation Age of Onset  . Liver cancer Father   . Cancer Mother     bladder cancer     History   Social History  . Marital Status: Married    Spouse Name: N/A    Number of Children: N/A  . Years of Education: N/A   Occupational History  . Not on file.   Social History Main Topics  . Smoking status: Never Smoker   . Smokeless tobacco: Not on file  . Alcohol Use: Yes     Comment: occasional  . Drug Use: Not on file  . Sexually Active: Not on file   Other Topics Concern  . Not on file   Social History Narrative  . No narrative on file      BP 115/75  Pulse 63  Ht 6' (1.829 m)  Wt 207 lb (93.895 kg)  BMI 28.07 kg/m2  Physical Exam:  Well appearing middle aged man, NAD HEENT: Unremarkable Neck:  7 cm JVD, no thyromegally Lungs:  Clear with no wheezes HEART:  Regular rate rhythm, no murmurs, no rubs, no clicks Abd:  soft, positive bowel sounds, no organomegally, no rebound, no guarding Ext:  2 plus pulses, no edema, no cyanosis, no clubbing Skin:  No rashes no nodules Neuro:  CN II through XII intact, motor grossly intact  EKG - NSR with no pre-excitation   Assess/Plan:

## 2012-08-04 NOTE — Assessment & Plan Note (Signed)
Her currently denies anginal symptoms. He will continue his current meds.

## 2012-08-11 ENCOUNTER — Encounter: Payer: Self-pay | Admitting: Cardiology

## 2012-08-11 ENCOUNTER — Ambulatory Visit (INDEPENDENT_AMBULATORY_CARE_PROVIDER_SITE_OTHER): Payer: Medicare Other | Admitting: Cardiology

## 2012-08-11 VITALS — BP 115/70 | HR 68 | Ht 72.0 in | Wt 211.0 lb

## 2012-08-11 DIAGNOSIS — R002 Palpitations: Secondary | ICD-10-CM

## 2012-08-11 DIAGNOSIS — I2510 Atherosclerotic heart disease of native coronary artery without angina pectoris: Secondary | ICD-10-CM

## 2012-08-11 NOTE — Patient Instructions (Addendum)
The current medical regimen is effective;  continue present plan and medications.  You will be contacted about scheduling your ablation with Dr Ladona Ridgel.  Follow up in 1 year with Dr Antoine Poche.  You will receive a letter in the mail 2 months before you are due.  Please call us when you receive this letter to schedule your follow up appointment.

## 2012-08-11 NOTE — Progress Notes (Signed)
   HPI The patient presents for evaluation of CAD. He was having increasing palpitations and documented SVT. I sent him to see Dr. Ladona Ridgel and they discussed ablation though the patient wanted to defer this at that time. He has been taking beta blockers.  However, he's been having fatigue with this. He still has his palpitations although they are less symptomatic than they were previously. He's not having any presyncope or syncope. He denies any chest pressure, neck or arm discomfort. He's had no weight gain or edema.   No Known Allergies  Current Outpatient Prescriptions  Medication Sig Dispense Refill  . aspirin 81 MG tablet Take 81 mg by mouth daily.        Marland Kitchen atorvastatin (LIPITOR) 40 MG tablet Take 1 tablet (40 mg total) by mouth daily.  30 tablet  11  . lansoprazole (PREVACID) 15 MG capsule Take 15 mg by mouth daily.        . metoprolol succinate (TOPROL-XL) 50 MG 24 hr tablet Take 1 tablet (50 mg total) by mouth 2 (two) times daily.  60 tablet  11   No current facility-administered medications for this visit.    Past Medical History  Diagnosis Date  . Hyperlipidemia   . CAD (coronary artery disease)   . Chronic cough   . Atrial fibrillation   . SVT (supraventricular tachycardia)     Past Surgical History  Procedure Laterality Date  . Coronary artery bypass graft  03/2006    ROS:  As stated in the HPI and negative for all other systems.  PHYSICAL EXAM BP 115/70  Pulse 68  Ht 6' (1.829 m)  Wt 211 lb (95.709 kg)  BMI 28.61 kg/m2 GENERAL:  Well appearing NECK:  No jugular venous distention, waveform within normal limits, carotid upstroke brisk and symmetric, no bruits, no thyromegaly LUNGS:  Clear to auscultation bilaterally CHEST: Well healed sternotomy scar. HEART:  PMI not displaced or sustained,S1 and S2 within normal limits, no S3, no S4, no clicks, no rubs, no murmurs ABD:  Flat, positive bowel sounds normal in frequency in pitch, no bruits, no rebound, no guarding,  no midline pulsatile mass, no hepatomegaly, no splenomegaly EXT:  2 plus pulses throughout, no edema, no cyanosis no clubbing   ASSESSMENT AND PLAN  SVT:  He will continue his current beta blocker. I sent a note to Dr. Ladona Ridgel to schedule his ablation.  CAD:  The patient has no new sypmtoms.  No further cardiovascular testing is indicated.  We will continue with aggressive risk reduction and meds as listed.  HTN:  The blood pressure is at target. No change in medications is indicated. We will continue with therapeutic lifestyle changes (TLC).  HYPERLIPIDEMIA:  His triglycerides were elevated when I drew his labs recently. His LDL was 114.  He is on a target dose of Lipitor.

## 2012-08-26 ENCOUNTER — Encounter (HOSPITAL_COMMUNITY): Payer: Self-pay | Admitting: Pharmacist

## 2012-08-27 ENCOUNTER — Encounter: Payer: Self-pay | Admitting: *Deleted

## 2012-08-27 ENCOUNTER — Other Ambulatory Visit: Payer: Self-pay | Admitting: *Deleted

## 2012-08-27 DIAGNOSIS — I471 Supraventricular tachycardia: Secondary | ICD-10-CM

## 2012-08-28 ENCOUNTER — Other Ambulatory Visit (INDEPENDENT_AMBULATORY_CARE_PROVIDER_SITE_OTHER): Payer: Medicare Other

## 2012-08-28 DIAGNOSIS — I471 Supraventricular tachycardia: Secondary | ICD-10-CM

## 2012-08-28 DIAGNOSIS — I498 Other specified cardiac arrhythmias: Secondary | ICD-10-CM

## 2012-08-28 LAB — CBC WITH DIFFERENTIAL/PLATELET
Basophils Absolute: 0 10*3/uL (ref 0.0–0.1)
Eosinophils Absolute: 0.3 10*3/uL (ref 0.0–0.7)
Lymphocytes Relative: 32.8 % (ref 12.0–46.0)
MCHC: 33.5 g/dL (ref 30.0–36.0)
Monocytes Relative: 8.9 % (ref 3.0–12.0)
Neutrophils Relative %: 52.8 % (ref 43.0–77.0)
RBC: 4.41 Mil/uL (ref 4.22–5.81)
RDW: 13 % (ref 11.5–14.6)

## 2012-08-28 LAB — BASIC METABOLIC PANEL
CO2: 26 mEq/L (ref 19–32)
Calcium: 9.6 mg/dL (ref 8.4–10.5)
Creatinine, Ser: 1.2 mg/dL (ref 0.4–1.5)
GFR: 65.25 mL/min (ref >60.00)
Glucose, Bld: 110 mg/dL — ABNORMAL HIGH (ref 70–99)

## 2012-09-03 ENCOUNTER — Ambulatory Visit (HOSPITAL_COMMUNITY)
Admission: RE | Admit: 2012-09-03 | Discharge: 2012-09-03 | Disposition: A | Discharge status: Home / Self Care | Payer: Medicare Other | Source: Ambulatory Visit | Attending: Internal Medicine | Admitting: Internal Medicine

## 2012-09-03 ENCOUNTER — Encounter (HOSPITAL_COMMUNITY)
Admission: RE | Disposition: A | Discharge status: Home / Self Care | Payer: Self-pay | Source: Ambulatory Visit | Attending: Internal Medicine

## 2012-09-03 DIAGNOSIS — I471 Supraventricular tachycardia, unspecified: Secondary | ICD-10-CM | POA: Diagnosis present

## 2012-09-03 DIAGNOSIS — Z951 Presence of aortocoronary bypass graft: Secondary | ICD-10-CM | POA: Insufficient documentation

## 2012-09-03 DIAGNOSIS — I498 Other specified cardiac arrhythmias: Secondary | ICD-10-CM | POA: Insufficient documentation

## 2012-09-03 DIAGNOSIS — I219 Acute myocardial infarction, unspecified: Secondary | ICD-10-CM

## 2012-09-03 DIAGNOSIS — I2510 Atherosclerotic heart disease of native coronary artery without angina pectoris: Secondary | ICD-10-CM | POA: Insufficient documentation

## 2012-09-03 DIAGNOSIS — I441 Atrioventricular block, second degree: Secondary | ICD-10-CM | POA: Insufficient documentation

## 2012-09-03 DIAGNOSIS — E785 Hyperlipidemia, unspecified: Secondary | ICD-10-CM | POA: Insufficient documentation

## 2012-09-03 HISTORY — PX: SUPRAVENTRICULAR TACHYCARDIA ABLATION: SHX5492

## 2012-09-03 SURGERY — SUPRAVENTRICULAR TACHYCARDIA ABLATION
Anesthesia: LOCAL

## 2012-09-03 MED ORDER — ACETAMINOPHEN 325 MG PO TABS: 650.0000 mg | ORAL_TABLET | ORAL | Status: DC | PRN

## 2012-09-03 MED ORDER — LIDOCAINE HCL (PF) 1 % IJ SOLN
INTRAMUSCULAR | Status: AC
  Filled 2012-09-03: qty 60

## 2012-09-03 MED ORDER — MIDAZOLAM HCL 5 MG/5ML IJ SOLN
INTRAMUSCULAR | Status: AC
  Filled 2012-09-03: qty 5

## 2012-09-03 MED ORDER — SODIUM CHLORIDE 0.9 % IJ SOLN: 3.0000 mL | Freq: Two times a day (BID) | INTRAMUSCULAR | Status: DC

## 2012-09-03 MED ORDER — SODIUM CHLORIDE 0.9 % IV SOLN: 250.0000 mL | INTRAVENOUS | Status: DC | PRN

## 2012-09-03 MED ORDER — ONDANSETRON HCL 4 MG/2ML IJ SOLN: 4.0000 mg | Freq: Four times a day (QID) | INTRAMUSCULAR | Status: DC | PRN

## 2012-09-03 MED ORDER — ATORVASTATIN CALCIUM 20 MG PO TABS
20.0000 mg | ORAL_TABLET | Freq: Every day | ORAL | Status: DC
  Filled 2012-09-03: qty 1

## 2012-09-03 MED ORDER — FENTANYL CITRATE 0.05 MG/ML IJ SOLN
INTRAMUSCULAR | Status: AC
  Filled 2012-09-03: qty 2

## 2012-09-03 MED ORDER — BUPIVACAINE HCL (PF) 0.25 % IJ SOLN
INTRAMUSCULAR | Status: AC
  Filled 2012-09-03: qty 60

## 2012-09-03 MED ORDER — SODIUM CHLORIDE 0.9 % IJ SOLN: 3.0000 mL | INTRAMUSCULAR | Status: DC | PRN

## 2012-09-03 MED ORDER — ASPIRIN EC 81 MG PO TBEC
81.0000 mg | DELAYED_RELEASE_TABLET | Freq: Every day | ORAL | Status: DC
  Filled 2012-09-03: qty 1

## 2012-09-03 NOTE — Discharge Summary (Cosign Needed)
    ELECTROPHYSIOLOGY PROCEDURE DISCHARGE SUMMARY    Patient ID: Steven Mathews,  MRN: [id number], DOB/AGE: [birth year] 67 y.o.  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  Coronary artery disease status post CABG 2.  Hyperlipidemia   Procedures This Admission: 1.  Electrophysiology study and radiofrequency catheter ablation on 09-03-2012 by Dr Ladona Ridgel.  See his dictated op note for full details.  AVNRT was successfully ablated with no early apparent complications.  The patient was noted to have some bradycardia and Mobitz I heart block post procedure and so his Toprol was discontinued.   Brief HPI: Steven Mathews is a 67 year old male with a long time history of tachycardia that has been refractory to medical therapy.  He was referred for consideration of ablation. Risks, benefits, and alternatives were reviewed with the patient who wished to proceed.    Hospital Course:  The patient was admitted and underwent ablation by Dr Ladona Ridgel with details as outlined in his op note.  He did have post op Mobitz I heart block and bradycardia and so his Toprol was discontinued.  He had no early apparent complications.  He completed his bedrest and was ambulating without difficulty prior to discharge. Dr Ladona Ridgel considered him stable for discharge to home.   Discharge Vitals: Blood pressure 118/78, pulse 78, temperature 97.6 F (36.4 C), temperature source Oral, resp. rate 16, height 6\' 2"  (1.88 m), weight 205 lb (92.987 kg), SpO2 98.00%.   Labs:   Lab Results  Component Value Date   WBC 6.7 08/28/2012   HGB 13.9 08/28/2012   HCT 41.5 08/28/2012   MCV 94.0 08/28/2012   PLT 215.0 08/28/2012     Recent Labs Lab 08/28/12 1031  NA 137  K 4.5  CL 103  CO2 26  BUN 17    CREATININE 1.2  CALCIUM 9.6  GLUCOSE 110*    Discharge Medications:    Medication List    STOP taking these medications       metoprolol succinate 50 MG 24 hr tablet  Commonly known as:  TOPROL-XL      TAKE these medications       aspirin EC 81 MG tablet  Take 81 mg by mouth daily.     atorvastatin 20 MG tablet  Commonly known as:  LIPITOR  Take 20 mg by mouth daily.     omeprazole 20 MG capsule  Commonly known as:  PRILOSEC  Take 20 mg by mouth daily.        Disposition:  Discharge Orders   Future Appointments Provider Department Dept Phone   10/13/2012 11:30 AM Marinus Maw, MD [facility] Main Office [place]) [phone]   Future Orders Complete By Expires     Diet - low sodium heart healthy  As directed     Increase activity slowly  As directed         Duration of Discharge Encounter: Greater than 30 minutes including physician time.  Signed, Gypsy Balsam, RN, BSN 09/03/2012, 4:26 PM

## 2012-09-03 NOTE — CV Procedure (Signed)
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 B#[id number].

## 2012-09-03 NOTE — H&P (Signed)
HPI [name] is referred by Dr. Antoine Poche for CATHETER ablation of SVT. He is a pleasant 67 yo man with a h/o CAD and HTN. He has had recurrent chest pressure and evaluation demonstrated SVT, which was prlonged and reproduced his symptoms. He has minimal if any palpitations. He has not had syncope. He was place on Toprol and his symptoms have improved. The patient denies dyspnea with exertion. No edema. His ECG during SVT demonstrated a narrow complex tachy at 170/min. No Known Allergies      Current Outpatient Prescriptions   Medication  Sig  Dispense  Refill   .  aspirin 81 MG tablet  Take 81 mg by mouth daily.           Marland Kitchen  atorvastatin (LIPITOR) 40 MG tablet  Take 1 tablet (40 mg total) by mouth daily.   30 tablet   11   .  lansoprazole (PREVACID) 15 MG capsule  Take 15 mg by mouth daily.           .  metoprolol succinate (TOPROL-XL) 50 MG 24 hr tablet  Take 1 tablet (50 mg total) by mouth 2 (two) times daily.   60 tablet   11       No current facility-administered medications for this visit.           Past Medical History   Diagnosis  Date   .  Hyperlipidemia     .  CAD (coronary artery disease)     .  Chronic cough     .  Atrial fibrillation     .  SVT (supraventricular tachycardia)          ROS:    All systems reviewed and negative except as noted in the HPI.      Past Surgical History   Procedure  Laterality  Date   .  Coronary artery bypass graft    03/2006           Family History   Problem  Relation  Age of Onset   .  Liver cancer  Father     .  Cancer  Mother         bladder cancer           History       Social History   .  Marital Status:  Married       Spouse Name:  N/A       Number of Children:  N/A   .  Years of Education:  N/A       Occupational History   .  Not on file.       Social History Main Topics   .  Smoking status:  Never Smoker    .  Smokeless tobacco:  Not on file   .  Alcohol Use:  Yes         Comment:  occasional   .  Drug Use:  Not on file   .  Sexually Active:  Not on file       Other Topics  Concern   .  Not on file       Social History Narrative   .  No narrative on file          BP 115/75  Pulse 63  Ht 6' (1.829 m)  Wt 207 lb (93.895 kg)  BMI 28.07 kg/m2   Physical Exam:   Well appearing middle aged man, NAD HEENT: Unremarkable Neck:  7 cm  JVD, no thyromegally Lungs:  Clear with no wheezes HEART:  Regular rate rhythm, no murmurs, no rubs, no clicks Abd:  soft, positive bowel sounds, no organomegally, no rebound, no guarding Ext:  2 plus pulses, no edema, no cyanosis, no clubbing Skin:  No rashes no nodules Neuro:  CN II through XII intact, motor grossly intact   EKG - NSR with no pre-excitation     Assess/Plan:         PALPITATIONS -He has documented SVT. I have discussed the treatment options with the patient and the risks/benefits/goals/expectations of medical therapy and catheter ablation have been reviewed and wishes to continue medical therapy. He has recurrent SVT, he wishes to proceed with catheter ablation.         CORONARY ATHEROSCLEROSIS, NATIVE VESSEL - Steven Maw, MD at 08/04/2012 12:24 PM    Status: Written Related Problem: CORONARY ATHEROSCLEROSIS, NATIVE VESSEL           Her currently denies anginal symptoms. He will continue his current meds.       Steven Mathews.

## 2012-09-04 NOTE — Op Note (Signed)
[place], HETZ NO.:  [id number]  MEDICAL RECORD NO.:  [id number]  LOCATION:  3W41C                        FACILITY:  [facility]  PHYSICIAN:  Doylene Canning. 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  INTRODUCTION:  The patient is a very pleasant 67 year old man with a longstanding history of tachy palpitations and documented SVT at rates of 130-140 beats per minute.  He notes that he has had heart racing like this for over 40 years.  He was placed on a beta-blocker and noted that his heart racing slowed in to the 130 range, but became almost __________.  He is now referred for catheter ablation.  PROCEDURE:  After informed consent was obtained, the patient was taken to the diagnostic EP lab in a fasting state.  After usual preparation and draping, intravenous fentanyl and midazolam was given for sedation. A 6-French hexapolar catheter was inserted percutaneously in the right jugular vein and advanced to the coronary sinus.  A 6-French quadripolar catheter was inserted percutaneously in the right femoral vein and advanced to the right ventricle.  A 6-French quadripolar catheter was inserted percutaneously in the right femoral vein and advanced to his bundle region.  After measurement of basic intervals, rapid ventricular pacing was carried out the right ventricle demonstrating VA Wenckebach at 400 milliseconds.  During rapid ventricular pacing, the atrial activation sequence was midline __________.  Next, programed ventricular stimulation was carried out from the right ventricle paced at 500 milliseconds.  The S1-S2 interval was stepwise decreased from 440 milliseconds down to 320 milliseconds with retrograde AV node ERP was observed.  Ventricular  stimulation, the atrial activation was midline and decremental.  Next, programed atrial stimulation was carried out __________ 600 milliseconds.  The S1-S2 interval was stepwise decreased 540 milliseconds down to 520 milliseconds where the AV node ERP was observed.  During programmed atrial stimulation, there were no AH jumps and no echo beats.  Next, rapid atrial pacing was carried out from the coronary sinus __________ 590 milliseconds __________ AV Wenckebach was observed.  During rapid atrial pacing, __________ 480 milliseconds, there was inducible SVT as well.  The PR interval was greater than __________ the atrial activation was midline and the VA interval was very short.  __________ tachycardia was over 500 milliseconds.  PVCs __________.  Ventricular pacing __________ tachycardia demonstrated termination of the tachycardia.  With all of the above, the diagnosis of AV node reentrant tachycardia was made.  It was noted, however, __________ conduction system was not particularly __________.  The 7- French quadripolar ablation catheter was then maneuvered into the region of Koch triangle __________ rhythm demonstrated.  __________ there was persistent junctional rhythm.  There was a single __________ discontinued and the catheter was __________ moved back into the inferior vena cava.  At this point, the patient was observed for approximately 40 minutes.  There was no inducible SVT.  Slow pathway conduction was removed.  The patient did have spontaneous AV Wenckebach during  this period and __________ and pacing was carried out, it demonstrated AV Wenckebach __________ approximately 600 milliseconds with the patient's __________.  The catheters were removed.  Hemostasis was assured and the patient was returned to his room in satisfactory condition.  COMPLICATIONS:  There were no immediate procedure complications.  RESULTS:  A.  Baseline ECG.  Baseline EGG demonstrates sinus rhythm  with right bundle-branch block. B.  Baseline intervals.  Sinus node cycle length was 765 milliseconds. The QRS duration __________ the HV interval was 46 milliseconds. C.  Rapid ventricular pacing.  Rapid __________ milliseconds.  Rapid ventricular pacing, the atrial activation was midline __________. D.  Programed ventricular stimulation.  Programed ventricular stimulation was carried out in the right ventricle at a base drive cycle length of 119 milliseconds.  S1-S2 interval stepwise decreased down to 340 milliseconds where the retrograde AV node ERP was observed. E.  __________ F.  Rapid atrial pacing.  Rapid atrial pacing was carried out __________ 600 milliseconds and stepwise decreased down to 480 milliseconds __________.  The PR interval was __________. G. Arrhythmias observed. 1. AV nodal reentrant tachycardia.  __________ with rapid atrial     pacing.  The duration was sustained, cycle length was approximately     505 milliseconds.  Method of termination was with rapid pacing.     __________ spontaneously.     a.     Mapping.  Mapping of __________ demonstrated __________ were      delivered __________ demonstrating accelerated junctional rhythm.      There was a single retrograde block __________.  There was      intermittent AV Wenckebach of uncertain clinical significance.     Doylene Canning. Ladona Ridgel, MD     GWT/MEDQ  D:  09/03/2012  T:  09/03/2012  Job:  [id number]

## 2012-10-06 ENCOUNTER — Encounter: Payer: Self-pay | Admitting: Internal Medicine

## 2012-10-06 ENCOUNTER — Ambulatory Visit (INDEPENDENT_AMBULATORY_CARE_PROVIDER_SITE_OTHER): Payer: Medicare Other | Admitting: Internal Medicine

## 2012-10-06 VITALS — BP 144/88 | HR 84 | Ht 72.0 in | Wt 207.4 lb

## 2012-10-06 DIAGNOSIS — I471 Supraventricular tachycardia: Secondary | ICD-10-CM

## 2012-10-06 DIAGNOSIS — I219 Acute myocardial infarction, unspecified: Secondary | ICD-10-CM

## 2012-10-06 DIAGNOSIS — I498 Other specified cardiac arrhythmias: Secondary | ICD-10-CM

## 2012-10-06 NOTE — Patient Instructions (Addendum)
Your physician wants you to follow-up in: 6 months with Dr Taylor You will receive a reminder letter in the mail two months in advance. If you don't receive a letter, please call our office to schedule the follow-up appointment.  

## 2012-10-12 ENCOUNTER — Telehealth: Payer: Self-pay

## 2012-10-12 NOTE — Telephone Encounter (Signed)
Patient wants to know when he had last tetanus shot. Please call back at [phone]

## 2012-10-13 ENCOUNTER — Encounter: Payer: Medicare Other | Admitting: Internal Medicine

## 2012-10-13 NOTE — Telephone Encounter (Signed)
Patient had T-dap August 05, 2007. Asked if he needed a copy and he stated he did not.

## 2012-10-14 ENCOUNTER — Encounter: Payer: Self-pay | Admitting: Internal Medicine

## 2012-10-14 NOTE — Assessment & Plan Note (Signed)
He is s/p catheter ablation and has had no recurrent symptomatic arrhythmias. No change in medical therapy.

## 2012-10-14 NOTE — Progress Notes (Signed)
HPI Steven Mathews returns today for followup. He is a pleasant 67 yo man with a h/o SVT who underwent EP study and catheter ablation of AVNRT several weeks ago. In the interim, he has been stable. He denies chest pain or sob. No syncope or recurrent episodes of tachycardia No Known Allergies   Current Outpatient Prescriptions  Medication Sig Dispense Refill  . aspirin EC 81 MG tablet Take 81 mg by mouth daily.      Marland Kitchen atorvastatin (LIPITOR) 20 MG tablet Take 20 mg by mouth daily.      Marland Kitchen omeprazole (PRILOSEC) 20 MG capsule Take 20 mg by mouth daily.       No current facility-administered medications for this visit.     Past Medical History  Diagnosis Date  . Hyperlipidemia   . CAD (coronary artery disease)   . Chronic cough   . Atrial fibrillation   . SVT (supraventricular tachycardia)     ROS:   All systems reviewed and negative except as noted in the HPI.   Past Surgical History  Procedure Laterality Date  . Coronary artery bypass graft  03/2006     Family History  Problem Relation Age of Onset  . Liver cancer Father   . Cancer Mother     bladder cancer     History   Social History  . Marital Status: Married    Spouse Name: N/A    Number of Children: N/A  . Years of Education: N/A   Occupational History  . Not on file.   Social History Main Topics  . Smoking status: Never Smoker   . Smokeless tobacco: Not on file  . Alcohol Use: Yes     Comment: occasional  . Drug Use: Not on file  . Sexual Activity: Not on file   Other Topics Concern  . Not on file   Social History Narrative  . No narrative on file     BP 144/88  Pulse 84  Ht 6' (1.829 m)  Wt 207 lb 6.4 oz (94.076 kg)  BMI 28.12 kg/m2  Physical Exam:  Well appearing NAD HEENT: Unremarkable Neck:  No JVD, no thyromegally Lymphatics:  No adenopathy Back:  No CVA tenderness Lungs:  Clear HEART:  Regular rate rhythm, no murmurs, no rubs, no clicks Abd:  soft, positive bowel sounds, no  organomegally, no rebound, no guarding Ext:  2 plus pulses, no edema, no cyanosis, no clubbing Skin:  No rashes no nodules Neuro:  CN II through XII intact, motor grossly intact  EKG NSR with no pre-excitation.  Assess/Plan:

## 2013-01-15 ENCOUNTER — Telehealth: Payer: Self-pay | Admitting: Internal Medicine

## 2013-01-15 NOTE — Telephone Encounter (Signed)
Left message on machine for patient to return my call 

## 2013-01-15 NOTE — Telephone Encounter (Signed)
New Message  Pt called states that he has experienced an irregular heartbeat// requests a call back to discuss.

## 2013-01-25 NOTE — Telephone Encounter (Signed)
Left message on his voicemail to call me if he needs me.  This is the second time I have left him a message to return my call

## 2013-02-09 ENCOUNTER — Ambulatory Visit (INDEPENDENT_AMBULATORY_CARE_PROVIDER_SITE_OTHER): Payer: Medicare Other | Admitting: Internal Medicine

## 2013-02-09 ENCOUNTER — Encounter: Payer: Self-pay | Admitting: Internal Medicine

## 2013-02-09 ENCOUNTER — Encounter (INDEPENDENT_AMBULATORY_CARE_PROVIDER_SITE_OTHER): Payer: Medicare Other

## 2013-02-09 ENCOUNTER — Encounter (INDEPENDENT_AMBULATORY_CARE_PROVIDER_SITE_OTHER): Payer: Self-pay

## 2013-02-09 ENCOUNTER — Encounter: Payer: Self-pay | Admitting: *Deleted

## 2013-02-09 VITALS — BP 136/79 | HR 89 | Ht 72.0 in | Wt 193.8 lb

## 2013-02-09 DIAGNOSIS — R002 Palpitations: Secondary | ICD-10-CM

## 2013-02-09 DIAGNOSIS — I498 Other specified cardiac arrhythmias: Secondary | ICD-10-CM

## 2013-02-09 DIAGNOSIS — I471 Supraventricular tachycardia: Secondary | ICD-10-CM

## 2013-02-09 NOTE — Assessment & Plan Note (Signed)
He is status post catheter ablation. It is uncertain based on his symptoms as to whether or not he has had recurrent SVT. His cardiac monitor we'll be telling.

## 2013-02-09 NOTE — Progress Notes (Signed)
      HPI Steven Mathews returns today for followup. He is a 68 year old man with a history of recurrent tachypalpitations and documented SVT who underwent catheter ablation of SVT several months ago. He was initially asymptomatic. Over the last 2 months, he has had increasingly frequent episodes of palpitations. He has checked his heart rate and noted heart rates as high as the 130 beats per minute during palpitations. He thinks that these episodes feel irregular rather than regular. They start and stop suddenly but will return without warning. No syncope, chest pain, or shortness of breath. After the episodes are over, his energy will be reduced. They typically last 5-10 minutes. Almost every day, he will have an episode. No Known Allergies   Current Outpatient Prescriptions  Medication Sig Dispense Refill  . aspirin EC 81 MG tablet Take 81 mg by mouth daily.      Marland Kitchen atorvastatin (LIPITOR) 20 MG tablet Take 20 mg by mouth daily.      Marland Kitchen omeprazole (PRILOSEC) 20 MG capsule Take 20 mg by mouth daily.       No current facility-administered medications for this visit.     Past Medical History  Diagnosis Date  . Hyperlipidemia   . CAD (coronary artery disease)   . Chronic cough   . Atrial fibrillation   . SVT (supraventricular tachycardia)     ROS:   All systems reviewed and negative except as noted in the HPI.   Past Surgical History  Procedure Laterality Date  . Coronary artery bypass graft  03/2006     Family History  Problem Relation Age of Onset  . Liver cancer Father   . Cancer Mother     bladder cancer     History   Social History  . Marital Status: Married    Spouse Name: N/A    Number of Children: N/A  . Years of Education: N/A   Occupational History  . Not on file.   Social History Main Topics  . Smoking status: Never Smoker   . Smokeless tobacco: Not on file  . Alcohol Use: Yes     Comment: occasional  . Drug Use: Not on file  . Sexual Activity: Not  on file   Other Topics Concern  . Not on file   Social History Narrative  . No narrative on file     BP 136/79  Pulse 89  Ht 6' (1.829 m)  Wt 193 lb 12.8 oz (87.907 kg)  BMI 26.28 kg/m2  Physical Exam:  Well appearing middle-aged man, NAD HEENT: Unremarkable Neck:  No JVD, no thyromegally Back:  No CVA tenderness Lungs:  Clear with no wheezes, rales, or rhonchi. HEART:  Regular rate rhythm, no murmurs, no rubs, no clicks Abd:  soft, positive bowel sounds, no organomegally, no rebound, no guarding Ext:  2 plus pulses, no edema, no cyanosis, no clubbing Skin:  No rashes no nodules Neuro:  CN II through XII intact, motor grossly intact  EKG - normal sinus rhythm with right bundle branch block   Assess/Plan:

## 2013-02-09 NOTE — Progress Notes (Signed)
Patient ID: Steven Mathews, male   DOB: 29-Feb-1945, 68 y.o.   MRN: [id number] E-Cardio 48 hour holter monitor applied to patient.

## 2013-02-09 NOTE — Assessment & Plan Note (Signed)
His episodes of palpitations are of uncertain etiology. He may be having recurrent SVT, frequent premature atrial contractions, or atrial fibrillation. I've recommended that the patient were a 48 hour cardiac Holter monitor. I'll plan to see him back in the office once we have the results. He is encouraged to minimize caffeine, alcohol, and get plenty of rest tonight.

## 2013-02-09 NOTE — Patient Instructions (Signed)
Your physician recommends that you schedule a follow-up appointment in: 3 weeks with Dr Lovena Le  Your physician has recommended that you wear a holter monitor. Holter monitors are medical devices that record the heart's electrical activity. Doctors most often use these monitors to diagnose arrhythmias. Arrhythmias are problems with the speed or rhythm of the heartbeat. The monitor is a small, portable device. You can wear one while you do your normal daily activities. This is usually used to diagnose what is causing palpitations/syncope (passing out).---48 hour

## 2013-03-02 ENCOUNTER — Ambulatory Visit: Payer: Medicare Other | Admitting: Internal Medicine

## 2013-03-04 ENCOUNTER — Ambulatory Visit (INDEPENDENT_AMBULATORY_CARE_PROVIDER_SITE_OTHER): Payer: Medicare Other | Admitting: Internal Medicine

## 2013-03-04 ENCOUNTER — Encounter: Payer: Self-pay | Admitting: Internal Medicine

## 2013-03-04 VITALS — BP 130/74 | HR 90 | Ht 72.0 in | Wt 200.2 lb

## 2013-03-04 DIAGNOSIS — I471 Supraventricular tachycardia: Secondary | ICD-10-CM

## 2013-03-04 DIAGNOSIS — I498 Other specified cardiac arrhythmias: Secondary | ICD-10-CM

## 2013-03-04 DIAGNOSIS — I4891 Unspecified atrial fibrillation: Secondary | ICD-10-CM

## 2013-03-04 MED ORDER — METOPROLOL TARTRATE 25 MG PO TABS: 25.0000 mg | ORAL_TABLET | Freq: Two times a day (BID) | ORAL | Status: DC

## 2013-03-04 MED ORDER — FLECAINIDE ACETATE 50 MG PO TABS: 50.0000 mg | ORAL_TABLET | Freq: Two times a day (BID) | ORAL | Status: DC

## 2013-03-04 NOTE — Patient Instructions (Signed)
Your physician recommends that you schedule a follow-up appointment in: 5 weeks with Dr Lovena Le   Your physician has recommended you make the following change in your medication:  1) Start Flecainide 50mg  twice daily--can increase in 2 weeks to 1 1/2 tablets twice daliy if needed 2) Restart Metoprolol 25mg  twice daily

## 2013-03-04 NOTE — Assessment & Plan Note (Signed)
The patient has recurrent SVT. We discussed the treatment options which include repeat catheter ablation, versus a trial of medical therapy with a combination of beta blockers and flecainide. The location of the patient's slow pathway compared to his fast pathway we'll make repeat catheter ablation of potential risk of creation of complete heart block. I've quoted the patient is 10-20% chance of ending up with a pacemaker. Rather than proceed initially with catheter ablation, he would like to try medical therapy as noted above. If his medical therapy worked for him in controlling his arrhythmias and is not result in untoward side effects, we will continue with this strategy. If his medical therapy is ineffective or results and side effects, we would reconsider catheter ablation despite its increased risk.

## 2013-03-04 NOTE — Progress Notes (Signed)
      HPI Mr. Kwasnik returns today for followup. He is a 68 year old man with a history of recurrent tachypalpitations and documented SVT who underwent catheter ablation of SVT several months ago. He was initially asymptomatic. Over the last 3 months, he has had increasingly frequent episodes of palpitations. He has checked his heart rate and noted heart rates as high as the 130 beats per minute during palpitations. He thinks that these episodes feel irregular rather than regular. They start and stop suddenly but will return without warning. He has worn a cardiac monitor which demonstrated recurrent SVT at rates of up to 150 beats per minute. This appears to be a long RP tachycardia.No syncope, chest pain, or shortness of breath. After the episodes are over, his energy will be reduced. They typically last 5-10 minutes. Almost every day, he will have an episode.  No Known Allergies   Current Outpatient Prescriptions  Medication Sig Dispense Refill  . aspirin EC 81 MG tablet Take 81 mg by mouth daily.      Marland Kitchen atorvastatin (LIPITOR) 20 MG tablet Take 20 mg by mouth daily.      Marland Kitchen omeprazole (PRILOSEC) 20 MG capsule Take 20 mg by mouth daily.       No current facility-administered medications for this visit.     Past Medical History  Diagnosis Date  . Hyperlipidemia   . CAD (coronary artery disease)   . Chronic cough   . Atrial fibrillation   . SVT (supraventricular tachycardia)     ROS:   All systems reviewed and negative except as noted in the HPI.   Past Surgical History  Procedure Laterality Date  . Coronary artery bypass graft  03/2006     Family History  Problem Relation Age of Onset  . Liver cancer Father   . Cancer Mother     bladder cancer     History   Social History  . Marital Status: Married    Spouse Name: N/A    Number of Children: N/A  . Years of Education: N/A   Occupational History  . Not on file.   Social History Main Topics  . Smoking status:  Never Smoker   . Smokeless tobacco: Not on file  . Alcohol Use: Yes     Comment: occasional  . Drug Use: Not on file  . Sexual Activity: Not on file   Other Topics Concern  . Not on file   Social History Narrative  . No narrative on file     BP 130/74  Pulse 90  Ht 6' (1.829 m)  Wt 200 lb 3.2 oz (90.81 kg)  BMI 27.15 kg/m2  Physical Exam:  Well appearing middle-aged man, NAD HEENT: Unremarkable Neck:  No JVD, no thyromegally Back:  No CVA tenderness Lungs:  Clear with no wheezes, rales, or rhonchi. HEART:  Regular rate rhythm, no murmurs, no rubs, no clicks Abd:  soft, positive bowel sounds, no organomegally, no rebound, no guarding Ext:  2 plus pulses, no edema, no cyanosis, no clubbing Skin:  No rashes no nodules Neuro:  CN II through XII intact, motor grossly intact  EKG - normal sinus rhythm with right bundle branch block   Assess/Plan:

## 2013-04-13 ENCOUNTER — Ambulatory Visit (INDEPENDENT_AMBULATORY_CARE_PROVIDER_SITE_OTHER): Payer: Medicare Other | Admitting: Internal Medicine

## 2013-04-13 ENCOUNTER — Encounter: Payer: Self-pay | Admitting: Internal Medicine

## 2013-04-13 VITALS — BP 129/71 | HR 62 | Ht 72.0 in | Wt 201.0 lb

## 2013-04-13 DIAGNOSIS — I2581 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: Secondary | ICD-10-CM

## 2013-04-13 DIAGNOSIS — I4891 Unspecified atrial fibrillation: Secondary | ICD-10-CM

## 2013-04-13 DIAGNOSIS — I498 Other specified cardiac arrhythmias: Secondary | ICD-10-CM

## 2013-04-13 DIAGNOSIS — I471 Supraventricular tachycardia: Secondary | ICD-10-CM

## 2013-04-13 MED ORDER — FLECAINIDE ACETATE 100 MG PO TABS: 100.0000 mg | ORAL_TABLET | Freq: Two times a day (BID) | ORAL | Status: DC

## 2013-04-13 MED ORDER — METOPROLOL TARTRATE 25 MG PO TABS
25.0000 mg | ORAL_TABLET | Freq: Two times a day (BID) | ORAL | Status: DC
Start: 2013-04-13 — End: 2013-07-27

## 2013-04-13 MED ORDER — AMIODARONE HCL 200 MG PO TABS
200.0000 mg | ORAL_TABLET | Freq: Every day | ORAL | Status: DC
Start: 2013-04-13 — End: 2013-07-27

## 2013-04-13 NOTE — Addendum Note (Signed)
Addended by: Janan Halter F on: 04/13/2013 09:39 AM   Modules accepted: Orders, Medications

## 2013-04-13 NOTE — Assessment & Plan Note (Addendum)
I had previously missed that he is s/p CABG and I have asked that he stop his flecainide, continue beta blocker for 3 weeks, and start amio 200 mg daily. Will check back in 10-12 weeks.

## 2013-04-13 NOTE — Patient Instructions (Addendum)
Your physician wants you to follow-up in: 10-12 weeks with  Dr Lovena Le    Your physician has recommended you make the following change in your medication:  1) Decrease Metoprolol to 25mg  twice daily 2) Stop Flecainide 3) Start Amiodarone 200mg  daily

## 2013-04-13 NOTE — Progress Notes (Signed)
HPI [name] returns today for followup. He is a 68 year old man with a history of recurrent tachypalpitations and documented SVT who underwent catheter ablation of SVT several months ago. He was initially asymptomatic. Over the last 3 months, he has had increasingly frequent episodes of palpitations. He has checked his heart rate and noted heart rates as high as the 130 beats per minute during palpitations. He thinks that these episodes feel irregular rather than regular. They start and stop suddenly but will return without warning. He has worn a cardiac monitor which demonstrated recurrent SVT at rates of up to 150 beats per minute. This appears to be a long RP Tachycardia. When I saw him last, we discussed the treatment options and he was placed on low dose flecainide. He now returns for followup. In the interim, he has done well. He is now on 100 mg twice daily. He is taking 50 mg twice daily of metoprolol. He has had almost no arrhythmias. He does feel a bit fatigue and tiredness. No syncope.   .No Known Allergies   Current Outpatient Prescriptions  Medication Sig Dispense Refill  . aspirin EC 81 MG tablet Take 81 mg by mouth daily.      Marland Kitchen atorvastatin (LIPITOR) 20 MG tablet Take 20 mg by mouth daily.      . flecainide (TAMBOCOR) 50 MG tablet Take 50 mg by mouth 4 (four) times daily.      . metoprolol tartrate (LOPRESSOR) 25 MG tablet Take 1 tablet (25 mg total) by mouth 2 (two) times daily.  180 tablet  3  . omeprazole (PRILOSEC) 20 MG capsule Take 20 mg by mouth daily.       No current facility-administered medications for this visit.     Past Medical History  Diagnosis Date  . Hyperlipidemia   . CAD (coronary artery disease)   . Chronic cough   . Atrial fibrillation   . SVT (supraventricular tachycardia)     ROS:   All systems reviewed and negative except as noted in the HPI.   Past Surgical History  Procedure Laterality Date  . Coronary artery bypass graft   03/2006     Family History  Problem Relation Age of Onset  . Liver cancer Father   . Cancer Mother     bladder cancer     History   Social History  . Marital Status: Married    Spouse Name: N/A    Number of Children: N/A  . Years of Education: N/A   Occupational History  . Not on file.   Social History Main Topics  . Smoking status: Never Smoker   . Smokeless tobacco: Not on file  . Alcohol Use: Yes     Comment: occasional  . Drug Use: Not on file  . Sexual Activity: Not on file   Other Topics Concern  . Not on file   Social History Narrative  . No narrative on file     BP 129/71  Pulse 62  Ht 6' (1.829 m)  Wt 201 lb (91.173 kg)  BMI 27.25 kg/m2  Physical Exam:  Well appearing middle-aged man, NAD HEENT: Unremarkable Neck:  No JVD, no thyromegally Back:  No CVA tenderness Lungs:  Clear with no wheezes, rales, or rhonchi. HEART:  Regular rate rhythm, no murmurs, no rubs, no clicks Abd:  soft, positive bowel sounds, no organomegally, no rebound, no guarding Ext:  2 plus pulses, no edema, no cyanosis, no clubbing Skin:  No rashes no nodules Neuro:  CN II through XII intact, motor grossly intact  EKG - normal sinus rhythm with right bundle branch block   Assess/Plan:

## 2013-04-13 NOTE — Assessment & Plan Note (Signed)
I have reviewed the patient's records in detail. When I saw him last, and we discussed the treatment options, I missed that the patient had undergone prior CABG. While he has preserved LV function and no angina, he clearly has CAD and for this reason we will stop his flecainide. I have discussed the treatment options with him in detail. One option would be admit for sotalol. Another would be amiodarone. He is not inclined at this time to have another ablation. He understands that there is a risk for heart block. After discussing the pro's and con's of both, I have started him on amiodarone. Will stop flecainide. He will continue low dose beta blocker, stopping his beta blocker in approx. 3 weeks.

## 2013-06-22 ENCOUNTER — Ambulatory Visit: Payer: Medicare Other | Admitting: Internal Medicine

## 2013-07-06 ENCOUNTER — Ambulatory Visit: Payer: Medicare Other | Admitting: Cardiology

## 2013-07-27 ENCOUNTER — Ambulatory Visit (INDEPENDENT_AMBULATORY_CARE_PROVIDER_SITE_OTHER): Payer: Medicare Other | Admitting: Internal Medicine

## 2013-07-27 ENCOUNTER — Encounter (INDEPENDENT_AMBULATORY_CARE_PROVIDER_SITE_OTHER): Payer: Self-pay

## 2013-07-27 ENCOUNTER — Encounter: Payer: Self-pay | Admitting: Internal Medicine

## 2013-07-27 VITALS — BP 126/75 | HR 62 | Ht 72.0 in | Wt 206.0 lb

## 2013-07-27 DIAGNOSIS — I2581 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: Secondary | ICD-10-CM

## 2013-07-27 DIAGNOSIS — R002 Palpitations: Secondary | ICD-10-CM

## 2013-07-27 DIAGNOSIS — I471 Supraventricular tachycardia, unspecified: Secondary | ICD-10-CM

## 2013-07-27 DIAGNOSIS — I498 Other specified cardiac arrhythmias: Secondary | ICD-10-CM

## 2013-07-27 DIAGNOSIS — I4719 Other supraventricular tachycardia: Secondary | ICD-10-CM

## 2013-07-27 MED ORDER — AMIODARONE HCL 200 MG PO TABS: 200.0000 mg | ORAL_TABLET | Freq: Every day | ORAL | Status: DC

## 2013-07-27 MED ORDER — ATORVASTATIN CALCIUM 20 MG PO TABS: 20.0000 mg | ORAL_TABLET | Freq: Every day | ORAL | Status: DC

## 2013-07-27 MED ORDER — METOPROLOL TARTRATE 25 MG PO TABS: 12.5000 mg | ORAL_TABLET | Freq: Two times a day (BID) | ORAL | Status: DC

## 2013-07-27 NOTE — Patient Instructions (Signed)
Your physician recommends that you schedule a follow-up appointment in: 3 months with Dr Lovena Le  Your physician has recommended you make the following change in your medication:  1) Stop Flecainide 2) Start Amiodarone 200mg  daily 3) Decrease Metoprolol to 12.5mg  twice daily(1/2 of a 25mg  tablet)

## 2013-07-28 ENCOUNTER — Encounter: Payer: Self-pay | Admitting: Internal Medicine

## 2013-07-28 NOTE — Progress Notes (Signed)
HPI [name] returns today for followup. He is a 68 year old man with a history of recurrent tachypalpitations and documented SVT who underwent catheter ablation of SVT a year ago. He was initially asymptomatic. However, he developed recurrent tachycardia palpitations and was subsequently found to have recurrent SVT. The patient did not wish to proceed with a second ablation. His risk for heart block would be substantially increased. We elected initially to place the patient on flecainide. Unfortunately, I did not appreciate that the patient had underlying coronary artery disease. After this was recognized, his flecainide was discontinued, despite a marked improvement in symptoms of SVT. When I saw the patient last several months ago, I recommended switching him to amiodarone. The patient did not follow my recommendations, and actually continued to take his flecainide. He has had no sustained SVT and otherwise feels well.  .No Known Allergies   Current Outpatient Prescriptions  Medication Sig Dispense Refill  . aspirin EC 81 MG tablet Take 81 mg by mouth daily.      Marland Kitchen atorvastatin (LIPITOR) 20 MG tablet Take 1 tablet (20 mg total) by mouth daily.  90 tablet  3  . metoprolol tartrate (LOPRESSOR) 25 MG tablet Take 0.5 tablets (12.5 mg total) by mouth 2 (two) times daily.  180 tablet  3  . omeprazole (PRILOSEC) 20 MG capsule Take 20 mg by mouth daily.      Marland Kitchen amiodarone (PACERONE) 200 MG tablet Take 1 tablet (200 mg total) by mouth daily.  90 tablet  3   No current facility-administered medications for this visit.     Past Medical History  Diagnosis Date  . Hyperlipidemia   . CAD (coronary artery disease)   . Chronic cough   . Atrial fibrillation   . SVT (supraventricular tachycardia)     ROS:   All systems reviewed and negative except as noted in the HPI.   Past Surgical History  Procedure Laterality Date  . Coronary artery bypass graft  03/2006     Family History    Problem Relation Age of Onset  . Liver cancer Father   . Cancer Mother     bladder cancer     History   Social History  . Marital Status: Married    Spouse Name: N/A    Number of Children: N/A  . Years of Education: N/A   Occupational History  . Not on file.   Social History Main Topics  . Smoking status: Never Smoker   . Smokeless tobacco: Not on file  . Alcohol Use: Yes     Comment: occasional  . Drug Use: Not on file  . Sexual Activity: Not on file   Other Topics Concern  . Not on file   Social History Narrative  . No narrative on file     BP 126/75  Pulse 62  Ht 6' (1.829 m)  Wt 206 lb (93.441 kg)  BMI 27.93 kg/m2  Physical Exam:  Well appearing middle-aged man, NAD HEENT: Unremarkable Neck:  No JVD, no thyromegally Back:  No CVA tenderness Lungs:  Clear with no wheezes, rales, or rhonchi. HEART:  Regular rate rhythm, no murmurs, no rubs, no clicks Abd:  soft, positive bowel sounds, no organomegally, no rebound, no guarding Ext:  2 plus pulses, no edema, no cyanosis, no clubbing Skin:  No rashes no nodules Neuro:  CN II through XII intact, motor grossly intact  EKG - normal sinus rhythm with right bundle branch block  Assess/Plan:

## 2013-07-28 NOTE — Assessment & Plan Note (Signed)
He denies anginal symptoms. He will continue his current medical therapy. 

## 2013-07-28 NOTE — Assessment & Plan Note (Signed)
His symptoms are much improved. He'll continue his current medications.

## 2013-07-28 NOTE — Assessment & Plan Note (Signed)
The patient has had recurrent symptoms and documented recurrent SVT. He continues to not wish to proceed with repeat catheter ablation. I have recommended he start taking amiodarone 200 mg daily. We will follow him up in several months.

## 2013-10-07 ENCOUNTER — Ambulatory Visit (INDEPENDENT_AMBULATORY_CARE_PROVIDER_SITE_OTHER): Payer: Medicare Other | Admitting: Internal Medicine

## 2013-10-07 ENCOUNTER — Encounter: Payer: Self-pay | Admitting: Internal Medicine

## 2013-10-07 VITALS — BP 126/70 | HR 61 | Ht 72.0 in | Wt 212.0 lb

## 2013-10-07 DIAGNOSIS — I471 Supraventricular tachycardia: Secondary | ICD-10-CM

## 2013-10-07 DIAGNOSIS — I498 Other specified cardiac arrhythmias: Secondary | ICD-10-CM

## 2013-10-07 DIAGNOSIS — R002 Palpitations: Secondary | ICD-10-CM

## 2013-10-07 MED ORDER — AMIODARONE HCL 200 MG PO TABS
100.0000 mg | ORAL_TABLET | Freq: Every day | ORAL | Status: DC
Start: 2013-10-07 — End: 2014-11-08

## 2013-10-07 NOTE — Progress Notes (Signed)
      HPI Steven Mathews returns today for followup. He is a 68 year old man with a history of recurrent tachypalpitations and documented SVT who underwent catheter ablation of SVT over a year ago. He was initially asymptomatic. However, he developed recurrent tachycardia palpitations and was subsequently found to have recurrent SVT. The patient did not wish to proceed with a second ablation. His risk for heart block would be substantially increased. He has been on amiodarone. He notes occaisional sensation of irregular heart beats. No syncope or chest pain and his sensation of rapid heart beating has resolved. .No Known Allergies   Current Outpatient Prescriptions  Medication Sig Dispense Refill  . amiodarone (PACERONE) 200 MG tablet Take 1 tablet (200 mg total) by mouth daily.  90 tablet  3  . aspirin EC 81 MG tablet Take 81 mg by mouth daily.      Marland Kitchen atorvastatin (LIPITOR) 20 MG tablet Take 1 tablet (20 mg total) by mouth daily.  90 tablet  3  . metoprolol tartrate (LOPRESSOR) 25 MG tablet Take 0.5 tablets (12.5 mg total) by mouth 2 (two) times daily.  180 tablet  3  . omeprazole (PRILOSEC) 20 MG capsule Take 20 mg by mouth daily.       No current facility-administered medications for this visit.     Past Medical History  Diagnosis Date  . Hyperlipidemia   . CAD (coronary artery disease)   . Chronic cough   . Atrial fibrillation   . SVT (supraventricular tachycardia)     ROS:   All systems reviewed and negative except as noted in the HPI.   Past Surgical History  Procedure Laterality Date  . Coronary artery bypass graft  03/2006     Family History  Problem Relation Age of Onset  . Liver cancer Father   . Cancer Mother     bladder cancer     History   Social History  . Marital Status: Married    Spouse Name: N/A    Number of Children: N/A  . Years of Education: N/A   Occupational History  . Not on file.   Social History Main Topics  . Smoking status: Never  Smoker   . Smokeless tobacco: Not on file  . Alcohol Use: Yes     Comment: occasional  . Drug Use: Not on file  . Sexual Activity: Not on file   Other Topics Concern  . Not on file   Social History Narrative  . No narrative on file     BP 126/70  Pulse 61  Ht 6' (1.829 m)  Wt 212 lb (96.163 kg)  BMI 28.75 kg/m2  Physical Exam:  Well appearing middle-aged man, NAD HEENT: Unremarkable Neck:  No JVD, no thyromegally Back:  No CVA tenderness Lungs:  Clear with no wheezes, rales, or rhonchi. 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

## 2013-10-07 NOTE — Assessment & Plan Note (Signed)
I wonder if he is having AV block. He is not interested in wearing another monitor. We will reduce the dose of amio.

## 2013-10-07 NOTE — Patient Instructions (Signed)
Your physician has recommended you make the following change in your medication:  1.) decrease amiodarone to 100 mg daily (take 1/2 tablet)  Your physician wants you to follow-up in: 6 months with Dr. Lovena Le.  You will receive a reminder letter in the mail two months in advance. If you don't receive a letter, please call our office to schedule the follow-up appointment.

## 2013-10-07 NOTE — Assessment & Plan Note (Signed)
His symptoms are well controlled. He will reduce the dose of his amio today as his first degree AV block has progressed.

## 2014-01-13 ENCOUNTER — Encounter (HOSPITAL_COMMUNITY): Payer: Self-pay | Admitting: Internal Medicine

## 2014-06-13 ENCOUNTER — Encounter: Payer: Self-pay | Admitting: Internal Medicine

## 2014-06-14 ENCOUNTER — Encounter: Payer: Self-pay | Admitting: Internal Medicine

## 2014-07-05 ENCOUNTER — Other Ambulatory Visit: Payer: Self-pay | Admitting: Internal Medicine

## 2014-09-30 ENCOUNTER — Other Ambulatory Visit: Payer: Self-pay | Admitting: Internal Medicine

## 2014-10-12 ENCOUNTER — Other Ambulatory Visit: Payer: Self-pay

## 2014-10-12 DIAGNOSIS — I471 Supraventricular tachycardia: Secondary | ICD-10-CM

## 2014-10-12 MED ORDER — METOPROLOL TARTRATE 25 MG PO TABS: 12.5000 mg | ORAL_TABLET | Freq: Two times a day (BID) | ORAL | Status: DC

## 2014-10-12 NOTE — Telephone Encounter (Signed)
Evans Lance, MD at 10/07/2013 10:03 AM  metoprolol tartrate (LOPRESSOR) 25 MG tabletTake 0.5 tablets (12.5 mg total) by mouth 2 (two) times daily

## 2014-10-20 ENCOUNTER — Encounter: Payer: Self-pay | Admitting: Internal Medicine

## 2014-11-04 ENCOUNTER — Other Ambulatory Visit: Payer: Self-pay | Admitting: Internal Medicine

## 2014-11-08 ENCOUNTER — Ambulatory Visit (INDEPENDENT_AMBULATORY_CARE_PROVIDER_SITE_OTHER): Payer: Medicare Other | Admitting: Internal Medicine

## 2014-11-08 ENCOUNTER — Encounter: Payer: Self-pay | Admitting: Internal Medicine

## 2014-11-08 VITALS — BP 106/72 | HR 68 | Ht 72.0 in | Wt 210.2 lb

## 2014-11-08 DIAGNOSIS — R002 Palpitations: Secondary | ICD-10-CM | POA: Diagnosis not present

## 2014-11-08 DIAGNOSIS — I471 Supraventricular tachycardia: Secondary | ICD-10-CM

## 2014-11-08 DIAGNOSIS — N5201 Erectile dysfunction due to arterial insufficiency: Secondary | ICD-10-CM

## 2014-11-08 DIAGNOSIS — I2581 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: Secondary | ICD-10-CM | POA: Diagnosis not present

## 2014-11-08 NOTE — Assessment & Plan Note (Signed)
These are rare and self limited. Will follow.

## 2014-11-08 NOTE — Assessment & Plan Note (Signed)
We discussed the use of both cialis and viagra. No additional questions. He is not taking any nitrates.

## 2014-11-08 NOTE — Patient Instructions (Signed)
Medication Instructions:  Your physician has recommended you make the following change in your medication:  1) Stop Metoprolol   Labwork: None ordered  Testing/Procedures: None ordered  Follow-Up: Your physician wants you to follow-up in: 12 months with Dr Knox Saliva will receive a reminder letter in the mail two months in advance. If you don't receive a letter, please call our office to schedule the follow-up appointment.   Any Other Special Instructions Will Be Listed Below (If Applicable).

## 2014-11-08 NOTE — Assessment & Plan Note (Signed)
His symptoms are well controlled. He will continue flecainded. Because his blood pressure is low, I will stop metoprolol.

## 2014-11-08 NOTE — Progress Notes (Signed)
      HPI Steven Mathews returns today for followup. He is a 69 year old man with a history of recurrent tachypalpitations and documented SVT who underwent catheter ablation of SVT over 2 years ago. He was initially asymptomatic. However, he developed recurrent tachycardia palpitations and was subsequently found to have recurrent SVT. The patient did not wish to proceed with a second ablation. His risk for heart block would be substantially increased. He has been on amiodarone. He notes occaisional sensation of irregular heart beats. No syncope or chest pain and his sensation of rapid heart beating has resolved. .No Known Allergies   Current Outpatient Prescriptions  Medication Sig Dispense Refill  . amiodarone (PACERONE) 200 MG tablet Take 200 mg by mouth daily.    Marland Kitchen aspirin EC 81 MG tablet Take 81 mg by mouth daily.    Marland Kitchen atorvastatin (LIPITOR) 20 MG tablet TAKE 1 TABLET BY MOUTH DAILY 30 tablet 0  . omeprazole (PRILOSEC) 20 MG capsule Take 20 mg by mouth daily.     No current facility-administered medications for this visit.     Past Medical History  Diagnosis Date  . Hyperlipidemia   . CAD (coronary artery disease)   . Chronic cough   . Atrial fibrillation ([place])   . SVT (supraventricular tachycardia) ([facility])     ROS:   All systems reviewed and negative except as noted in the HPI.   Past Surgical History  Procedure Laterality Date  . Coronary artery bypass graft  03/2006  . Supraventricular tachycardia ablation N/A 09/03/2012    Procedure: SUPRAVENTRICULAR TACHYCARDIA ABLATION;  Surgeon: Evans Lance, MD;  Location: [facility] CATH LAB;  Service: Cardiovascular;  Laterality: N/A;     Family History  Problem Relation Age of Onset  . Liver cancer Father   . Cancer Mother     bladder cancer     Social History   Social History  . Marital Status: Married    Spouse Name: N/A  . Number of Children: N/A  . Years of Education: N/A   Occupational History  . Not on file.    Social History Main Topics  . Smoking status: Never Smoker   . Smokeless tobacco: Not on file  . Alcohol Use: Yes     Comment: occasional  . Drug Use: Not on file  . Sexual Activity: Not on file   Other Topics Concern  . Not on file   Social History Narrative     BP 106/72 mmHg  Pulse 68  Ht 6' (1.829 m)  Wt 210 lb 3.2 oz (95.346 kg)  BMI 28.50 kg/m2  Physical Exam:  Well appearing middle-aged man, NAD HEENT: Unremarkable Neck:  6 cm JVD, no thyromegally Back:  No CVA tenderness Lungs:  Clear with no wheezes, rales, or rhonchi. HEART:  Regular rate rhythm, no murmurs, no rubs, no clicks Abd:  soft, positive bowel sounds, no organomegally, no rebound, no guarding Ext:  2 plus pulses, no edema, no cyanosis, no clubbing Skin:  No rashes no nodules Neuro:  CN II through XII intact, motor grossly intact    Assess/Plan:

## 2014-11-08 NOTE — Assessment & Plan Note (Signed)
He denies anginal symptoms. He will continue his current meds except he will stop metoprolol as blood pressure is low and he was fatigued.

## 2014-11-11 ENCOUNTER — Other Ambulatory Visit: Payer: Self-pay | Admitting: Internal Medicine

## 2014-12-07 ENCOUNTER — Other Ambulatory Visit: Payer: Self-pay | Admitting: Internal Medicine

## 2015-02-20 ENCOUNTER — Other Ambulatory Visit: Payer: Self-pay | Admitting: *Deleted

## 2015-02-20 DIAGNOSIS — I471 Supraventricular tachycardia: Secondary | ICD-10-CM

## 2015-02-20 MED ORDER — AMIODARONE HCL 200 MG PO TABS: 200.0000 mg | ORAL_TABLET | Freq: Every day | ORAL | Status: DC

## 2015-02-20 MED ORDER — ATORVASTATIN CALCIUM 20 MG PO TABS: 20.0000 mg | ORAL_TABLET | Freq: Every day | ORAL | Status: DC

## 2015-02-21 ENCOUNTER — Other Ambulatory Visit: Payer: Self-pay

## 2015-02-21 DIAGNOSIS — I471 Supraventricular tachycardia: Secondary | ICD-10-CM

## 2015-02-21 MED ORDER — ATORVASTATIN CALCIUM 20 MG PO TABS: 20.0000 mg | ORAL_TABLET | Freq: Every day | ORAL | Status: DC

## 2015-02-21 MED ORDER — AMIODARONE HCL 200 MG PO TABS: 200.0000 mg | ORAL_TABLET | Freq: Every day | ORAL | Status: DC

## 2015-07-12 ENCOUNTER — Other Ambulatory Visit: Payer: Self-pay | Admitting: Internal Medicine

## 2015-07-19 ENCOUNTER — Telehealth: Payer: Self-pay | Admitting: Internal Medicine

## 2015-07-19 NOTE — Telephone Encounter (Signed)
LMTCB for Amber

## 2015-07-19 NOTE — Telephone Encounter (Signed)
Follow-up     Steven Mathews is returning the nurses phone call.

## 2015-07-19 NOTE — Telephone Encounter (Signed)
Received message that [facility] is unable to take call, leave a message, message left on voice mail that I was returning a call to [employer].

## 2015-07-19 NOTE — Telephone Encounter (Signed)
New Message:   Pt have Whorl Keratopathy. She wants to know if he can stop taking Amiodarone and take something else?

## 2015-07-19 NOTE — Telephone Encounter (Signed)
Call returned from [employer] with [employer] stating that pt has corneal deposits that can be increase with the medication Amiodarone. Dr. Teodoro Spray would like to know if medication can be stopped or changed to something different. Would like to know Dr. Tanna Furry suggestions. If want to talk directly with Dr. [facility] - cell [phone].  Made her aware the Dr. Lovena Le is out of the office until next week but someone will be in touch once he has responded; verbalized understanding.

## 2015-07-25 NOTE — Addendum Note (Signed)
Addended by: Janan Halter F on: 07/25/2015 10:29 AM   Modules accepted: Orders, Medications

## 2015-07-25 NOTE — Telephone Encounter (Signed)
Stop Amiodarone per Dr Lovena Le.  Left message for Amber okay to stop.  Spoke with patient and he is aware also

## 2015-09-18 ENCOUNTER — Other Ambulatory Visit: Payer: Self-pay | Admitting: Internal Medicine

## 2015-09-29 ENCOUNTER — Telehealth: Payer: Self-pay | Admitting: Internal Medicine

## 2015-09-29 NOTE — Telephone Encounter (Signed)
Calling stating he was on Amiodarone but his Optometrist suggested that he discontinue due to a eye problem.  Dr. Lovena Le stopped on 6/20.  He states recently that he has felt irregular HR.  States HR hasn't been "racing" like it has been in past.  This has been off and on for a few days.  Denies SOB or lightheadedness.  Only symptom is weakness when has the irregular rate.  States HR has been in upper 80's-90's.  Made him an appointment with Roderic Palau, NP for Monday 8/28 at 3:00.  Given code for entry and instructions to get to clinic.

## 2015-09-29 NOTE — Telephone Encounter (Signed)
New Message  Pt voiced Steven Mathews the Optomotrist took pt off BP medication and pt voiced he was informed if he has problems with not taking medication to contact our office.  Please follow up with pt. Thanks!

## 2015-10-02 ENCOUNTER — Encounter (HOSPITAL_COMMUNITY): Payer: Self-pay | Admitting: Nurse Practitioner

## 2015-10-02 ENCOUNTER — Ambulatory Visit (HOSPITAL_COMMUNITY)
Admission: RE | Admit: 2015-10-02 | Discharge: 2015-10-02 | Disposition: A | Discharge status: Home / Self Care | Payer: Commercial Managed Care - HMO | Source: Ambulatory Visit | Attending: Nurse Practitioner | Admitting: Nurse Practitioner

## 2015-10-02 VITALS — BP 122/76 | HR 82 | Ht 72.0 in | Wt 202.0 lb

## 2015-10-02 DIAGNOSIS — R002 Palpitations: Secondary | ICD-10-CM

## 2015-10-02 DIAGNOSIS — I471 Supraventricular tachycardia: Secondary | ICD-10-CM | POA: Diagnosis not present

## 2015-10-02 DIAGNOSIS — E785 Hyperlipidemia, unspecified: Secondary | ICD-10-CM | POA: Insufficient documentation

## 2015-10-02 DIAGNOSIS — Z79899 Other long term (current) drug therapy: Secondary | ICD-10-CM | POA: Diagnosis not present

## 2015-10-02 DIAGNOSIS — Z7982 Long term (current) use of aspirin: Secondary | ICD-10-CM | POA: Insufficient documentation

## 2015-10-02 DIAGNOSIS — Z951 Presence of aortocoronary bypass graft: Secondary | ICD-10-CM | POA: Diagnosis not present

## 2015-10-02 DIAGNOSIS — I2510 Atherosclerotic heart disease of native coronary artery without angina pectoris: Secondary | ICD-10-CM | POA: Insufficient documentation

## 2015-10-02 LAB — BASIC METABOLIC PANEL
ANION GAP: 9 (ref 5–15)
BUN: 17 mg/dL (ref 6–20)
CALCIUM: 9.8 mg/dL (ref 8.9–10.3)
CHLORIDE: 104 mmol/L (ref 101–111)
CO2: 26 mmol/L (ref 22–32)
CREATININE: 1.2 mg/dL (ref 0.61–1.24)
GFR calc non Af Amer: 59 mL/min — ABNORMAL LOW (ref >60)
Glucose, Bld: 119 mg/dL — ABNORMAL HIGH (ref 65–99)
Potassium: 4.1 mmol/L (ref 3.5–5.1)
SODIUM: 139 mmol/L (ref 135–145)

## 2015-10-02 LAB — CBC
HEMATOCRIT: 44.3 % (ref 39.0–52.0)
Hemoglobin: 14.7 g/dL (ref 13.0–17.0)
MCH: 31.5 pg (ref 26.0–34.0)
MCHC: 33.2 g/dL (ref 30.0–36.0)
MCV: 94.9 fL (ref 78.0–100.0)
PLATELETS: 259 10*3/uL (ref 150–400)
RBC: 4.67 MIL/uL (ref 4.22–5.81)
RDW: 12.6 % (ref 11.5–15.5)
WBC: 7 10*3/uL (ref 4.0–10.5)

## 2015-10-02 LAB — MAGNESIUM: Magnesium: 2 mg/dL (ref 1.7–2.4)

## 2015-10-02 LAB — TSH: TSH: 3.575 u[IU]/mL (ref 0.350–4.500)

## 2015-10-02 NOTE — Progress Notes (Signed)
Primary Care Physician: No PCP Per Patient Referring Physician: [facility] triage EP: Dr. Lovena Le Cardiologist: Dr. Minus Breeding [place] is a 70 y.o. male with a h/o  CAD, s/p bypass, SVT that had unsuccessful SVT ablation in 2014. Initially got relief from the racing heart, but racing soon returned. He was on flecainide for awhile but later changed to amiodarone. He recently developed eye issues and the ophthalmologist contributed it to amiodarone and he stopped drug 6/20. His vision has improved.He has recently noticed some palpitations, different than the racing he had before. He notices mostly in the evening, when he is trying to go to sleep. He states that he can count the irregular pulse. He will change position and the rhythm will straighten out. He is drinking two glasses of wine a night. It does not happen everyday, sometimes two or three days a week. Denies snoring history, no excessive caffeine use. Drinks two glasses of wine a night. Is not overweight, no change in medical history other than above. Last echo 2014.Usually episodes are shorter than one hour.  Today, he denies symptoms of palpitations, chest pain, shortness of breath, orthopnea, PND, lower extremity edema, dizziness, presyncope, syncope, or neurologic sequela. The patient is tolerating medications without difficulties and is otherwise without complaint today.   Past Medical History:  Diagnosis Date  . Atrial fibrillation ([place])   . CAD (coronary artery disease)   . Chronic cough   . Hyperlipidemia   . SVT (supraventricular tachycardia) ([facility])    Past Surgical History:  Procedure Laterality Date  . CORONARY ARTERY BYPASS GRAFT  03/2006  . SUPRAVENTRICULAR TACHYCARDIA ABLATION N/A 09/03/2012   Procedure: SUPRAVENTRICULAR TACHYCARDIA ABLATION;  Surgeon: Evans Lance, MD;  Location: [facility] CATH LAB;  Service: Cardiovascular;  Laterality: N/A;    Current Outpatient Prescriptions  Medication Sig Dispense Refill  .  aspirin EC 81 MG tablet Take 81 mg by mouth daily.    Marland Kitchen atorvastatin (LIPITOR) 20 MG tablet TAKE 1 TABLET EVERY DAY 90 tablet 1  . omeprazole (PRILOSEC) 20 MG capsule Take 20 mg by mouth daily.     No current facility-administered medications for this encounter.     No Known Allergies  Social History   Social History  . Marital status: Married    Spouse name: N/A  . Number of children: N/A  . Years of education: N/A   Occupational History  . Not on file.   Social History Main Topics  . Smoking status: Never Smoker  . Smokeless tobacco: Not on file  . Alcohol use Yes     Comment: occasional  . Drug use: Unknown  . Sexual activity: Not on file   Other Topics Concern  . Not on file   Social History Narrative  . No narrative on file    Family History  Problem Relation Age of Onset  . Liver cancer Father   . Cancer Mother     bladder cancer    ROS- All systems are reviewed and negative except as per the HPI above  Physical Exam: Vitals:   10/02/15 1519  BP: 122/76  Pulse: 82  Weight: 202 lb (91.6 kg)  Height: 6' (1.829 m)    GEN- The patient is well appearing, alert and oriented x 3 today.   Head- normocephalic, atraumatic Eyes-  Sclera clear, conjunctiva pink Ears- hearing intact Oropharynx- clear Neck- supple, no JVP Lymph- no cervical lymphadenopathy Lungs- Clear to ausculation bilaterally, normal work of breathing Heart- Regular  rate and rhythm, no murmurs, rubs or gallops, PMI not laterally displaced GI- soft, NT, ND, + BS Extremities- no clubbing, cyanosis, or edema MS- no significant deformity or atrophy Skin- no rash or lesion Psych- euthymic mood, full affect Neuro- strength and sensation are intact  EKG- NSR, RBBB, pr int 166 ms, qrs int 152 ms, qtc 481 ms Epic records reviewed  Assessment and Plan: 1. S/p SVT ablation 2014 Now off amiodarone due to eye issues The way he describes irregular heart beat sounds more like premature  contractions, but could be afib/flutter although he has no h/o same Will order event monitor to further identify arrhythmia, do not think it would be caught with 48 hour monitor Echo Encouraged to limit alcohol use as possible trigger Labs bmet, cbc, tsh, mag  Has f/u with Dr. Lovena Le 10/18 Afib clinic as needed

## 2015-10-03 ENCOUNTER — Other Ambulatory Visit (HOSPITAL_COMMUNITY): Payer: Self-pay | Admitting: *Deleted

## 2015-10-03 DIAGNOSIS — I480 Paroxysmal atrial fibrillation: Secondary | ICD-10-CM

## 2015-10-05 ENCOUNTER — Ambulatory Visit (INDEPENDENT_AMBULATORY_CARE_PROVIDER_SITE_OTHER): Payer: Medicare HMO

## 2015-10-05 DIAGNOSIS — R002 Palpitations: Secondary | ICD-10-CM

## 2015-10-16 ENCOUNTER — Telehealth: Payer: Self-pay | Admitting: *Deleted

## 2015-10-16 NOTE — Telephone Encounter (Signed)
I received EKG tracing from 10/13/15 04:25:13PM (CT)--SVT with PVCs:  Dr Lovena Le reviewed EKG tracing -no new recommendations per Dr Lovena Le, keep follow up appt scheduled with him 11/22/15.  Pt is aware no new recommendations per Dr Lovena Le, keep appt scheduled with Dr Lovena Le 11/22/15.

## 2015-10-17 ENCOUNTER — Ambulatory Visit (HOSPITAL_COMMUNITY)
Admission: RE | Admit: 2015-10-17 | Discharge: 2015-10-17 | Disposition: A | Discharge status: Home / Self Care | Payer: Commercial Managed Care - HMO | Source: Ambulatory Visit | Attending: Nurse Practitioner | Admitting: Nurse Practitioner

## 2015-10-17 DIAGNOSIS — I471 Supraventricular tachycardia: Secondary | ICD-10-CM | POA: Diagnosis not present

## 2015-10-17 DIAGNOSIS — I517 Cardiomegaly: Secondary | ICD-10-CM | POA: Diagnosis not present

## 2015-10-17 DIAGNOSIS — I2510 Atherosclerotic heart disease of native coronary artery without angina pectoris: Secondary | ICD-10-CM | POA: Insufficient documentation

## 2015-10-17 DIAGNOSIS — I4891 Unspecified atrial fibrillation: Secondary | ICD-10-CM | POA: Diagnosis present

## 2015-10-17 DIAGNOSIS — E785 Hyperlipidemia, unspecified: Secondary | ICD-10-CM | POA: Insufficient documentation

## 2015-10-17 DIAGNOSIS — I480 Paroxysmal atrial fibrillation: Secondary | ICD-10-CM | POA: Diagnosis not present

## 2015-10-17 DIAGNOSIS — I5189 Other ill-defined heart diseases: Secondary | ICD-10-CM | POA: Diagnosis not present

## 2015-10-17 DIAGNOSIS — I071 Rheumatic tricuspid insufficiency: Secondary | ICD-10-CM | POA: Diagnosis not present

## 2015-10-17 NOTE — Progress Notes (Signed)
  Echocardiogram 2D Echocardiogram has been performed.  Steven Mathews 10/17/2015, 8:29 AM

## 2015-10-19 ENCOUNTER — Telehealth: Payer: Self-pay | Admitting: Cardiology

## 2015-10-19 NOTE — Telephone Encounter (Signed)
[employer] called to notify that the patient sent in a transmission saying that he passed out.  [employer] could not initially get in touch with the patient but the patient did call back and stated that he did not pass out and that he accidentally pressed the button.  The rhythm at that time was junctional tachycardia at 128bpm for 1.5 minutes and then converted to NSR at 65bpm.

## 2015-11-13 ENCOUNTER — Encounter: Payer: Self-pay | Admitting: Internal Medicine

## 2015-11-22 ENCOUNTER — Ambulatory Visit (INDEPENDENT_AMBULATORY_CARE_PROVIDER_SITE_OTHER): Payer: Medicare HMO | Admitting: Internal Medicine

## 2015-11-22 ENCOUNTER — Encounter: Payer: Self-pay | Admitting: Internal Medicine

## 2015-11-22 VITALS — BP 116/70 | HR 74 | Ht 72.0 in | Wt 204.0 lb

## 2015-11-22 DIAGNOSIS — I480 Paroxysmal atrial fibrillation: Secondary | ICD-10-CM | POA: Diagnosis not present

## 2015-11-22 MED ORDER — METOPROLOL TARTRATE 25 MG PO TABS: 12.5000 mg | ORAL_TABLET | Freq: Two times a day (BID) | ORAL | 3 refills | Status: DC

## 2015-11-22 NOTE — Patient Instructions (Addendum)
Medication Instructions:  Your physician has recommended you make the following change in your medication:  1) Start Metoprolol Tartrate 25 mg tablet - Take 1/2 tablet twice daily. May increase to 1 tablet twice daily    Labwork: None Ordered   Testing/Procedures: None Ordered   Follow-Up: Your physician wants you to follow-up in: 6 months with Dr. Lovena Le.  You will receive a reminder letter in the mail two months in advance. If you don't receive a letter, please call our office to schedule the follow-up appointment.   Any Other Special Instructions Will Be Listed Below (If Applicable).     If you need a refill on your cardiac medications before your next appointment, please call your pharmacy.

## 2015-11-22 NOTE — Progress Notes (Signed)
HPI [name] returns today for followup. He is a 70 year old man with a history of CAD and CABG with recurrent tachypalpitations and documented SVT who underwent catheter ablation of SVT over 3 years ago. He was initially asymptomatic. However, he developed recurrent tachycardia palpitations and was subsequently found to have recurrent SVT. The patient did not wish to proceed with a second ablation. His risk for heart block would be substantially increased. 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 He has had recurrent symptoms and has warn a cardiac monitor which demonstrates occaisional AVWB and SVT at 140-150/min. Previously he had taken metoprolol.  .No Known Allergies   Current Outpatient Prescriptions  Medication Sig Dispense Refill  . aspirin EC 81 MG tablet Take 81 mg by mouth daily.    Marland Kitchen atorvastatin (LIPITOR) 20 MG tablet Take 20 mg by mouth daily.    Marland Kitchen omeprazole (PRILOSEC) 20 MG capsule Take 20 mg by mouth daily.    . metoprolol tartrate (LOPRESSOR) 25 MG tablet Take 0.5 tablets (12.5 mg total) by mouth 2 (two) times daily. May increase to 1 tablet (25 mg) twice daily 45 tablet 3   No current facility-administered medications for this visit.      Past Medical History:  Diagnosis Date  . Atrial fibrillation ([place])   . CAD (coronary artery disease)   . Chronic cough   . Hyperlipidemia   . SVT (supraventricular tachycardia) (HCC)     ROS:   All systems reviewed and negative except as noted in the HPI.   Past Surgical History:  Procedure Laterality Date  . CORONARY ARTERY BYPASS GRAFT  03/2006  . SUPRAVENTRICULAR TACHYCARDIA ABLATION N/A 09/03/2012   Procedure: SUPRAVENTRICULAR TACHYCARDIA ABLATION;  Surgeon: Evans Lance, MD;  Location: [facility] CATH LAB;  Service: Cardiovascular;  Laterality: N/A;     Family History  Problem Relation Age of Onset  . Liver cancer Father   . Cancer Mother     bladder cancer     Social History     Social History  . Marital status: Married    Spouse name: N/A  . Number of children: N/A  . Years of education: N/A   Occupational History  . Not on file.   Social History Main Topics  . Smoking status: Never Smoker  . Smokeless tobacco: Never Used  . Alcohol use Yes     Comment: occasional  . Drug use: No  . Sexual activity: Not on file   Other Topics Concern  . Not on file   Social History Narrative  . No narrative on file     BP 116/70   Pulse 74   Ht 6' (1.829 m)   Wt 204 lb (92.5 kg)   BMI 27.67 kg/m   Physical Exam:  Well appearing middle-aged man, NAD HEENT: Unremarkable Neck:  6 cm JVD, no thyromegally Back:  No CVA tenderness Lungs:  Clear with no wheezes, rales, or rhonchi. HEART:  Regular rate rhythm, no murmurs, no rubs, no clicks Abd:  soft, positive bowel sounds, no organomegally, no rebound, no guarding Ext:  2 plus pulses, no edema, no cyanosis, no clubbing Skin:  No rashes no nodules Neuro:  CN II through XII intact, motor grossly intact   ECG - NSR with AVWB  Assess/Plan: 1. SVT - we discussed the treatment options in detail. He would like to avoid a PPM. We will start him back on low dose metoprolol. 2. AVWB -  he is minimally symptomatic. He may or may not tolerate his low dose of metoprolol. 3. CAD - he denies anginal symptoms. We discussed the warning signs. He does not have typical angina but does have nausea and diaphoresis and arm pain. 4. HTN - his blood pressure has been well controlled. Will follow.  [name].D.

## 2016-02-01 ENCOUNTER — Encounter: Payer: Self-pay | Admitting: Internal Medicine

## 2016-03-18 ENCOUNTER — Ambulatory Visit (AMBULATORY_SURGERY_CENTER): Payer: Self-pay

## 2016-03-18 VITALS — Ht 72.0 in | Wt 202.0 lb

## 2016-03-18 DIAGNOSIS — Z1211 Encounter for screening for malignant neoplasm of colon: Secondary | ICD-10-CM

## 2016-03-18 NOTE — Progress Notes (Signed)
No allergies to eggs or soy No diet meds No home oxygen No past problems with anesthesia  Declined emmi 

## 2016-03-19 ENCOUNTER — Encounter: Payer: Self-pay | Admitting: Internal Medicine

## 2016-03-29 ENCOUNTER — Encounter: Payer: Self-pay | Admitting: Internal Medicine

## 2016-03-29 ENCOUNTER — Ambulatory Visit (AMBULATORY_SURGERY_CENTER): Payer: Medicare PPO | Admitting: Internal Medicine

## 2016-03-29 VITALS — BP 111/69 | HR 62 | Temp 97.7°F | Resp 16 | Ht 72.0 in | Wt 202.0 lb

## 2016-03-29 DIAGNOSIS — D123 Benign neoplasm of transverse colon: Secondary | ICD-10-CM

## 2016-03-29 DIAGNOSIS — Z1212 Encounter for screening for malignant neoplasm of rectum: Secondary | ICD-10-CM

## 2016-03-29 DIAGNOSIS — Z1211 Encounter for screening for malignant neoplasm of colon: Secondary | ICD-10-CM | POA: Diagnosis not present

## 2016-03-29 DIAGNOSIS — D122 Benign neoplasm of ascending colon: Secondary | ICD-10-CM

## 2016-03-29 MED ORDER — SODIUM CHLORIDE 0.9 % IV SOLN: 500.0000 mL | INTRAVENOUS | Status: DC

## 2016-03-29 NOTE — Patient Instructions (Addendum)
   I found and removed 2 small polyps.  I will let you know pathology results and when to have another routine colonoscopy by mail.  I appreciate the opportunity to care for you. Gatha Mayer, MD, [facility]   Handouts given: Polyps.   YOU HAD AN ENDOSCOPIC PROCEDURE TODAY AT [facility]:   Refer to the procedure report that was given to you for any specific questions about what was found during the examination.  If the procedure report does not answer your questions, please call your gastroenterologist to clarify.  If you requested that your care partner not be given the details of your procedure findings, then the procedure report has been included in a sealed envelope for you to review at your convenience later.  YOU SHOULD EXPECT: Some feelings of bloating in the abdomen. Passage of more gas than usual.  Walking can help get rid of the air that was put into your GI tract during the procedure and reduce the bloating. If you had a lower endoscopy (such as a colonoscopy or flexible sigmoidoscopy) you may notice spotting of blood in your stool or on the toilet paper. If you underwent a bowel prep for your procedure, you may not have a normal bowel movement for a few days.  Please Note:  You might notice some irritation and congestion in your nose or some drainage.  This is from the oxygen used during your procedure.  There is no need for concern and it should clear up in a day or so.  SYMPTOMS TO REPORT IMMEDIATELY:   Following lower endoscopy (colonoscopy or flexible sigmoidoscopy):  Excessive amounts of blood in the stool  Significant tenderness or worsening of abdominal pains  Swelling of the abdomen that is new, acute  Fever of 100F or higher  For urgent or emergent issues, a gastroenterologist can be reached at any hour by calling [phone].   DIET:  We do recommend a small meal at first, but then you may proceed to your regular diet.  Drink plenty of  fluids but you should avoid alcoholic beverages for 24 hours.  ACTIVITY:  You should plan to take it easy for the rest of today and you should NOT DRIVE or use heavy machinery until tomorrow (because of the sedation medicines used during the test).    FOLLOW UP: Our staff will call the number listed on your records the next business day following your procedure to check on you and address any questions or concerns that you may have regarding the information given to you following your procedure. If we do not reach you, we will leave a message.  However, if you are feeling well and you are not experiencing any problems, there is no need to return our call.  We will assume that you have returned to your regular daily activities without incident.  If any biopsies were taken you will be contacted by phone or by letter within the next 1-3 weeks.  Please call us at [phone] if you have not heard about the biopsies in 3 weeks.    SIGNATURES/CONFIDENTIALITY: You and/or your care partner have signed paperwork which will be entered into your electronic medical record.  These signatures attest to the fact that that the information above on your After Visit Summary has been reviewed and is understood.  Full responsibility of the confidentiality of this discharge information lies with you and/or your care-partner.

## 2016-03-29 NOTE — Progress Notes (Signed)
Pt's states no medical or surgical changes since previsit or office visit. 

## 2016-03-29 NOTE — Progress Notes (Signed)
Called to room to assist during endoscopic procedure.  Patient ID and intended procedure confirmed with present staff. Received instructions for my participation in the procedure from the performing physician.  

## 2016-03-29 NOTE — Op Note (Signed)
[facility] Patient Name: Steven Mathews Procedure Date: 03/29/2016 10:55 AM MRN: [id number] Endoscopist: Gatha Mayer , MD Age: 71 Referring MD:  Date of Birth: 09-Jan-1946 Gender: Male Account #: [id number] Procedure:                Colonoscopy Indications:              Screening for colorectal malignant neoplasm, Last                            colonoscopy: 2006 Medicines:                Propofol per Anesthesia, Monitored Anesthesia Care Procedure:                Pre-Anesthesia Assessment:                           - Prior to the procedure, a History and Physical                            was performed, and patient medications and                            allergies were reviewed. The patient's tolerance of                            previous anesthesia was also reviewed. The risks                            and benefits of the procedure and the sedation                            options and risks were discussed with the patient.                            All questions were answered, and informed consent                            was obtained. Prior Anticoagulants: The patient                            last took aspirin on the day of the procedure. ASA                            Grade Assessment: II - A patient with mild systemic                            disease. After reviewing the risks and benefits,                            the patient was deemed in satisfactory condition to                            undergo the procedure.  After obtaining informed consent, the colonoscope                            was passed under direct vision. Throughout the                            procedure, the patient's blood pressure, pulse, and                            oxygen saturations were monitored continuously. The                            Model CF-HQ190L [id number]) scope was introduced                            through the anus and advanced to  the the cecum,                            identified by appendiceal orifice and ileocecal                            valve. The colonoscopy was performed without                            difficulty. The patient tolerated the procedure                            well. The quality of the bowel preparation was                            excellent. The bowel preparation used was Miralax.                            The ileocecal valve, appendiceal orifice, and                            rectum were photographed. Scope In: 11:04:45 AM Scope Out: 11:19:19 AM Scope Withdrawal Time: 0 hours 10 minutes 48 seconds  Total Procedure Duration: 0 hours 14 minutes 34 seconds  Findings:                 The perianal and digital rectal examinations were                            normal. Pertinent negatives include normal prostate                            (size, shape, and consistency).                           Two sessile polyps were found in the transverse                            colon and ascending colon. The polyps were  diminutive in size. These polyps were removed with                            a cold snare. Resection and retrieval were                            complete. Verification of patient identification                            for the specimen was done. Estimated blood loss was                            minimal.                           The exam was otherwise without abnormality on                            direct and retroflexion views. Complications:            No immediate complications. Estimated Blood Loss:     Estimated blood loss was minimal. Impression:               - Two diminutive polyps in the transverse colon and                            in the ascending colon, removed with a cold snare.                            Resected and retrieved.                           - The examination was otherwise normal on direct                             and retroflexion views. Recommendation:           - Patient has a contact number available for                            emergencies. The signs and symptoms of potential                            delayed complications were discussed with the                            patient. Return to normal activities tomorrow.                            Written discharge instructions were provided to the                            patient.                           - Resume previous diet.                           -  Continue present medications.                           - Repeat colonoscopy is recommended. The                            colonoscopy date will be determined after pathology                            results from today's exam become available for                            review. Gatha Mayer, MD 03/29/2016 11:25:17 AM This report has been signed electronically.

## 2016-03-29 NOTE — Progress Notes (Signed)
To recovery VSS report to [employer]

## 2016-04-01 ENCOUNTER — Telehealth: Payer: Self-pay | Admitting: *Deleted

## 2016-04-01 ENCOUNTER — Telehealth: Payer: Self-pay

## 2016-04-01 NOTE — Telephone Encounter (Signed)
  Follow up Call-  Call back number 03/29/2016  Post procedure Call Back phone  # [phone]  Permission to leave phone message Yes  Some recent data might be hidden     Patient questions:  Do you have a fever, pain , or abdominal swelling? No. Pain Score  0 *  Have you tolerated food without any problems? Yes.    Have you been able to return to your normal activities? Yes.    Do you have any questions about your discharge instructions: Diet   No. Medications  No. Follow up visit  No.  Do you have questions or concerns about your Care? No.  Actions: * If pain score is 4 or above: No action needed, pain <4.

## 2016-04-01 NOTE — Telephone Encounter (Signed)
Attempted to reach pt. With follow up call following endoscopic procedure on 03/29/2016.  LM on pt. Ans. Machine.   Will try to call again later today.

## 2016-04-04 ENCOUNTER — Encounter: Payer: Self-pay | Admitting: Internal Medicine

## 2016-04-04 DIAGNOSIS — Z86010 Personal history of colonic polyps: Secondary | ICD-10-CM

## 2016-04-04 DIAGNOSIS — Z860101 Personal history of adenomatous and serrated colon polyps: Secondary | ICD-10-CM | POA: Insufficient documentation

## 2016-04-04 HISTORY — DX: Personal history of adenomatous and serrated colon polyps: Z86.0101

## 2016-04-04 HISTORY — DX: Personal history of colonic polyps: Z86.010

## 2016-04-04 NOTE — Progress Notes (Signed)
2 diminutive adenomas recall 2023

## 2016-07-09 ENCOUNTER — Other Ambulatory Visit: Payer: Self-pay | Admitting: Internal Medicine

## 2016-07-09 DIAGNOSIS — I480 Paroxysmal atrial fibrillation: Secondary | ICD-10-CM

## 2016-09-17 ENCOUNTER — Other Ambulatory Visit: Payer: Self-pay | Admitting: Internal Medicine

## 2016-09-17 DIAGNOSIS — I480 Paroxysmal atrial fibrillation: Secondary | ICD-10-CM

## 2016-10-11 ENCOUNTER — Other Ambulatory Visit: Payer: Self-pay | Admitting: Internal Medicine

## 2016-10-11 DIAGNOSIS — I480 Paroxysmal atrial fibrillation: Secondary | ICD-10-CM

## 2016-10-16 ENCOUNTER — Other Ambulatory Visit: Payer: Self-pay | Admitting: Internal Medicine

## 2016-10-17 NOTE — Telephone Encounter (Signed)
Patient needs to call and schedule appointment for further refills *1st attempt*

## 2016-11-07 ENCOUNTER — Other Ambulatory Visit: Payer: Self-pay | Admitting: Internal Medicine

## 2016-11-07 DIAGNOSIS — I480 Paroxysmal atrial fibrillation: Secondary | ICD-10-CM

## 2016-11-12 ENCOUNTER — Other Ambulatory Visit: Payer: Self-pay

## 2016-11-12 ENCOUNTER — Telehealth: Payer: Self-pay

## 2016-11-12 DIAGNOSIS — I480 Paroxysmal atrial fibrillation: Secondary | ICD-10-CM

## 2016-11-12 MED ORDER — METOPROLOL TARTRATE 25 MG PO TABS: ORAL_TABLET | ORAL | 0 refills | Status: DC

## 2016-11-12 NOTE — Telephone Encounter (Signed)
11/22/2015 [place] Office  Medication Instructions:  Your physician has recommended you make the following change in your medication:  1) Start Metoprolol Tartrate 25 mg tablet - Take 1/2 tablet twice daily. May increase to 1 tablet twice daily   Called and spoke with patient and he takes 1 tablet twice daily of metoprolol

## 2016-11-13 NOTE — Telephone Encounter (Signed)
Returned phone call to Pt.  Per Pt his SVT has returned, is not controlled with the metoprolol.  Pt states it is uncomfortable at times.  Pt has h/o CABG.  Pt with f/u with Dr. Lovena Le 01/07/2017, will send to scheduling to see if Pt can be seen sooner.  Pt notified will receive call from scheduling.  Pt indicates understanding.  Thanked for return call.

## 2016-11-14 MED ORDER — METOPROLOL TARTRATE 25 MG PO TABS: ORAL_TABLET | ORAL | 0 refills | Status: DC

## 2016-11-14 NOTE — Addendum Note (Signed)
Addended by: Derl Barrow on: 11/14/2016 03:55 PM   Modules accepted: Orders

## 2016-11-14 NOTE — Addendum Note (Signed)
Addended by: Derl Barrow on: 11/14/2016 03:50 PM   Modules accepted: Orders

## 2016-11-27 ENCOUNTER — Encounter: Payer: Self-pay | Admitting: Internal Medicine

## 2016-11-27 ENCOUNTER — Ambulatory Visit (INDEPENDENT_AMBULATORY_CARE_PROVIDER_SITE_OTHER): Payer: Medicare PPO | Admitting: Internal Medicine

## 2016-11-27 VITALS — BP 120/82 | HR 72 | Ht 72.0 in | Wt 200.0 lb

## 2016-11-27 DIAGNOSIS — I480 Paroxysmal atrial fibrillation: Secondary | ICD-10-CM | POA: Diagnosis not present

## 2016-11-27 DIAGNOSIS — I471 Supraventricular tachycardia, unspecified: Secondary | ICD-10-CM

## 2016-11-27 DIAGNOSIS — R002 Palpitations: Secondary | ICD-10-CM

## 2016-11-27 NOTE — Progress Notes (Signed)
HPI Mr. Boakye returns today for ongoing evaluation and management of SVT. He is a pleasant 71 yo man with CAD, s/p CABG who developed SVT and underwent catheter ablation. He then developed recurrent SVT and was placed on several AA meds but ultimately was treated with amio before developing eye problems. His amio was stopped. He has been placed on metoprolol in low dose but continues to have symptoms. Since I saw him, his symptoms are more frequent although he describes mostly the sensation of an iregular heart beat rather than a fast HB.  No Known Allergies   Current Outpatient Prescriptions  Medication Sig Dispense Refill  . aspirin EC 81 MG tablet Take 81 mg by mouth daily.    Marland Kitchen atorvastatin (LIPITOR) 20 MG tablet TAKE 1 TABLET EVERY DAY 30 tablet 1  . metoprolol tartrate (LOPRESSOR) 25 MG tablet 25 mg. Take 1/2 tablet by mouth twice daily may increase to one tablet twice daily     Current Facility-Administered Medications  Medication Dose Route Frequency Provider Last Rate Last Dose  . 0.9 %  sodium chloride infusion  500 mL Intravenous Continuous Gatha Mayer, MD         Past Medical History:  Diagnosis Date  . Atrial fibrillation ([place])   . CAD (coronary artery disease)   . Chronic cough   . GERD (gastroesophageal reflux disease)   . Hx of adenomatous colonic polyps 04/04/2016  . Hyperlipidemia   . Hypertension   . Myocardial infarct ([place])   . SVT (supraventricular tachycardia) (HCC)     ROS:   All systems reviewed and negative except as noted in the HPI.   Past Surgical History:  Procedure Laterality Date  . CORONARY ARTERY BYPASS GRAFT  03/2006  . SUPRAVENTRICULAR TACHYCARDIA ABLATION N/A 09/03/2012   Procedure: SUPRAVENTRICULAR TACHYCARDIA ABLATION;  Surgeon: Evans Lance, MD;  Location: [facility] CATH LAB;  Service: Cardiovascular;  Laterality: N/A;     Family History  Problem Relation Age of Onset  . Liver cancer Father   . Cancer Mother        bladder  cancer  . Colon cancer Brother 34     Social History   Social History  . Marital status: Married    Spouse name: N/A  . Number of children: N/A  . Years of education: N/A   Occupational History  . Not on file.   Social History Main Topics  . Smoking status: Never Smoker  . Smokeless tobacco: Never Used  . Alcohol use 4.2 oz/week    7 Glasses of wine per week     Comment: occasional  . Drug use: No  . Sexual activity: Not on file   Other Topics Concern  . Not on file   Social History Narrative  . No narrative on file     BP 120/82   Pulse 72   Ht 6' (1.829 m)   Wt 200 lb (90.7 kg)   BMI 27.12 kg/m   Physical Exam:  Well appearing 71 yo man, NAD HEENT: Unremarkable Neck:  6 cm JVD, no thyromegally Lymphatics:  No adenopathy Back:  No CVA tenderness Lungs:  Clear with no wheezes HEART:  Regular rate rhythm, no murmurs, no rubs, no clicks Abd:  soft, positive bowel sounds, no organomegally, no rebound, no guarding Ext:  2 plus pulses, no edema, no cyanosis, no clubbing Skin:  No rashes no nodules Neuro:  CN II through XII intact, motor grossly intact  EKG -  NSR with RBBB   Assess/Plan: 1. SVT/palpitations - his symptoms have changed though may be more frequent. I have discussed the treatment options and have recommended he wear a 48 hour holter monitor. I will review and then make rec's on additional or different AA drug therapy. 2. CAD - he denies anginal symptoms. No change in meds. His CAD limits our options with regard to his AA drug therapy.   [name].D.

## 2016-11-27 NOTE — Patient Instructions (Signed)
Medication Instructions:  Your physician recommends that you continue on your current medications as directed. Please refer to the Current Medication list given to you today.  Labwork: None ordered.  Testing/Procedures: Your physician has recommended that you wear a holter monitor. Holter monitors are medical devices that record the heart's electrical activity. Doctors most often use these monitors to diagnose arrhythmias. Arrhythmias are problems with the speed or rhythm of the heartbeat. The monitor is a small, portable device. You can wear one while you do your normal daily activities. This is usually used to diagnose what is causing palpitations/syncope (passing out).  Please schedule for a 48 hour holter monitor.  Follow-Up: Your physician wants you to follow-up based on results of holter monitor.  Any Other Special Instructions Will Be Listed Below (If Applicable).    If you need a refill on your cardiac medications before your next appointment, please call your pharmacy.

## 2016-11-30 ENCOUNTER — Other Ambulatory Visit: Payer: Self-pay | Admitting: Internal Medicine

## 2016-12-10 ENCOUNTER — Ambulatory Visit (INDEPENDENT_AMBULATORY_CARE_PROVIDER_SITE_OTHER): Payer: Medicare PPO

## 2016-12-10 DIAGNOSIS — I480 Paroxysmal atrial fibrillation: Secondary | ICD-10-CM

## 2016-12-10 DIAGNOSIS — I471 Supraventricular tachycardia, unspecified: Secondary | ICD-10-CM

## 2016-12-10 DIAGNOSIS — R002 Palpitations: Secondary | ICD-10-CM

## 2017-01-07 ENCOUNTER — Ambulatory Visit: Payer: Medicare PPO | Admitting: Internal Medicine

## 2017-05-21 ENCOUNTER — Other Ambulatory Visit: Payer: Self-pay | Admitting: Internal Medicine

## 2017-05-21 MED ORDER — ATORVASTATIN CALCIUM 20 MG PO TABS: 20.0000 mg | ORAL_TABLET | Freq: Every day | ORAL | 1 refills | Status: DC

## 2017-05-21 MED ORDER — METOPROLOL TARTRATE 25 MG PO TABS: ORAL_TABLET | ORAL | 1 refills | Status: DC

## 2017-05-21 NOTE — Telephone Encounter (Signed)
Pt's medication was sent to pt's pharmacy as requested. Confirmation received.  °

## 2017-09-25 ENCOUNTER — Telehealth: Payer: Self-pay | Admitting: Internal Medicine

## 2017-09-25 NOTE — Telephone Encounter (Signed)
New Message:    Please call pt,he says he has no energy and has Pneumonia. He wants to make sure his heart is not causing his Pneumonia.

## 2017-09-26 NOTE — Telephone Encounter (Signed)
Pt called to report that he is being treated for pneumonia over the past week. He says he has a history of pneumonia over the past several years. He just wanted Dr. Lovena Le to know and his PMD had noted that it would be good to have follow up with DR. Lovena Le.. His appointment is 11/04/17. Pt says he feels well except for the chronic fatigue which is worse with the pneumonia. He still has palpitations on and off but without dizziness and sob. I advised the patient that he can increase his metoprolol to a full tab when he is having his palpitations. I also told him that I will let Dr. Lovena Le know that he has been sick and since he is still having palpitations if he would like to see him sooner or for the patient to let us know if anything worsens. If Dr. Lovena Le has any recommendations we will call him back and let him know. Pt agrees and will let us know if anything changes before then.

## 2017-09-29 NOTE — Telephone Encounter (Signed)
Sent to scheduling to review for sooner appt.

## 2017-10-08 ENCOUNTER — Encounter

## 2017-10-08 ENCOUNTER — Encounter: Payer: Self-pay | Admitting: Internal Medicine

## 2017-10-08 ENCOUNTER — Ambulatory Visit (INDEPENDENT_AMBULATORY_CARE_PROVIDER_SITE_OTHER): Payer: Medicare Other | Admitting: Internal Medicine

## 2017-10-08 VITALS — BP 104/66 | HR 59 | Ht 72.0 in | Wt 190.8 lb

## 2017-10-08 DIAGNOSIS — I2510 Atherosclerotic heart disease of native coronary artery without angina pectoris: Secondary | ICD-10-CM

## 2017-10-08 DIAGNOSIS — I471 Supraventricular tachycardia: Secondary | ICD-10-CM | POA: Diagnosis not present

## 2017-10-08 DIAGNOSIS — Z951 Presence of aortocoronary bypass graft: Secondary | ICD-10-CM | POA: Diagnosis not present

## 2017-10-08 NOTE — Progress Notes (Signed)
HPI Steven Mathews returns today for followup. He is a pleasant 72 yo man with CAD, s/p CABG, HTN, and dyslipidemia. He had SVT and underwent ablation but had recurrence. 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 He remains minimally symptomatic. He has not passed out and no near syncope.  No Known Allergies   Current Outpatient Medications  Medication Sig Dispense Refill  . aspirin EC 81 MG tablet Take 81 mg by mouth daily.    Marland Kitchen atorvastatin (LIPITOR) 20 MG tablet Take 1 tablet (20 mg total) by mouth daily. 90 tablet 1  . metoprolol tartrate (LOPRESSOR) 25 MG tablet Take 1/2 tablet by mouth twice daily may increase to one tablet twice daily 180 tablet 1   Current Facility-Administered Medications  Medication Dose Route Frequency Provider Last Rate Last Dose  . 0.9 %  sodium chloride infusion  500 mL Intravenous Continuous Gatha Mayer, MD         Past Medical History:  Diagnosis Date  . Atrial fibrillation ([place])   . CAD (coronary artery disease)   . Chronic cough   . GERD (gastroesophageal reflux disease)   . Hx of adenomatous colonic polyps 04/04/2016  . Hyperlipidemia   . Hypertension   . Myocardial infarct ([place])   . SVT (supraventricular tachycardia) (H[facility])     ROS:   All systems reviewed and negative except as noted in the HPI.   Past Surgical History:  Procedure Laterality Date  . CORONARY ARTERY BYPASS GRAFT  03/2006  . SUPRAVENTRICULAR TACHYCARDIA ABLATION N/A 09/03/2012   Procedure: SUPRAVENTRICULAR TACHYCARDIA ABLATION;  Surgeon: Evans Lance, MD;  Location: [facility] CATH LAB;  Service: Cardiovascular;  Laterality: N/A;     Family History  Problem Relation Age of Onset  . Liver cancer Father   . Cancer Mother        bladder cancer  . Colon cancer Brother 7     Social History   Socioeconomic History  . Marital status: Married    Spouse name: Not on file  . Number of children: Not on file  .  Years of education: Not on file  . Highest education level: Not on file  Occupational History  . Not on file  Social Needs  . Financial resource strain: Not on file  . Food insecurity:    Worry: Not on file    Inability: Not on file  . Transportation needs:    Medical: Not on file    Non-medical: Not on file  Tobacco Use  . Smoking status: Never Smoker  . Smokeless tobacco: Never Used  Substance and Sexual Activity  . Alcohol use: Yes    Alcohol/week: 7.0 standard drinks    Types: 7 Glasses of wine per week    Comment: occasional  . Drug use: No  . Sexual activity: Not on file  Lifestyle  . Physical activity:    Days per week: Not on file    Minutes per session: Not on file  . Stress: Not on file  Relationships  . Social connections:    Talks on phone: Not on file    Gets together: Not on file    Attends religious service: Not on file    Active member of club or organization: Not on file    Attends meetings of clubs or organizations: Not on file    Relationship status: Not on file  . Intimate partner violence:  Fear of current or ex partner: Not on file    Emotionally abused: Not on file    Physically abused: Not on file    Forced sexual activity: Not on file  Other Topics Concern  . Not on file  Social History Narrative  . Not on file     BP 104/66   Pulse (!) 59   Ht 6' (1.829 m)   Wt 190 lb 12.8 oz (86.5 kg)   SpO2 98%   BMI 25.88 kg/m   Physical Exam:  Well appearing 72 yo man, NAD HEENT: Unremarkable Neck:  6 cm JVD, no thyromegally Lymphatics:  No adenopathy Back:  No CVA tenderness Lungs:  Clear HEART:  Regular rate rhythm, no murmurs, no rubs, no clicks, split S2.  Abd:  soft, positive bowel sounds, no organomegally, no rebound, no guarding Ext:  2 plus pulses, no edema, no cyanosis, no clubbing Skin:  No rashes no nodules Neuro:  CN II through XII intact, motor grossly intact  EKG - NSR with AVWB and RBBB    Assess/Plan: 1. SVT - his  symptoms are fairly well controlled. He will continue low dose toprol. 2. Heart block - he has conduction system disease and will eventually need a PPM. He has a class 2 indication currently but is not symptomatic in terms of syncope or near syncope. I reviewed the symptoms/signs of progression and he will call us if things change. 3. CAD - he remains active working on his house with no anginal symptoms. 4. HTN - his blood pressure is very well controlled.   [name].D.

## 2017-10-08 NOTE — Patient Instructions (Addendum)

## 2017-11-04 ENCOUNTER — Ambulatory Visit: Payer: Medicare PPO | Admitting: Internal Medicine

## 2017-11-18 ENCOUNTER — Other Ambulatory Visit: Payer: Self-pay | Admitting: Internal Medicine

## 2018-03-13 ENCOUNTER — Inpatient Hospital Stay (HOSPITAL_COMMUNITY)
Admission: EM | Admit: 2018-03-13 | Discharge: 2018-03-16 | DRG: 281 | Disposition: A | Discharge status: Home / Self Care | Payer: Medicare Other | Attending: Cardiology | Admitting: Cardiology

## 2018-03-13 ENCOUNTER — Emergency Department (HOSPITAL_COMMUNITY): Payer: Medicare Other

## 2018-03-13 ENCOUNTER — Encounter (HOSPITAL_COMMUNITY): Payer: Self-pay | Admitting: Pharmacy Technician

## 2018-03-13 ENCOUNTER — Inpatient Hospital Stay (HOSPITAL_COMMUNITY)
Admission: EM | Disposition: A | Discharge status: Home / Self Care | Payer: Self-pay | Source: Home / Self Care | Attending: Cardiovascular Disease

## 2018-03-13 ENCOUNTER — Other Ambulatory Visit: Payer: Self-pay

## 2018-03-13 DIAGNOSIS — I2581 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: Secondary | ICD-10-CM | POA: Diagnosis present

## 2018-03-13 DIAGNOSIS — I10 Essential (primary) hypertension: Secondary | ICD-10-CM | POA: Diagnosis present

## 2018-03-13 DIAGNOSIS — I4510 Unspecified right bundle-branch block: Secondary | ICD-10-CM | POA: Diagnosis present

## 2018-03-13 DIAGNOSIS — I2510 Atherosclerotic heart disease of native coronary artery without angina pectoris: Secondary | ICD-10-CM

## 2018-03-13 DIAGNOSIS — I471 Supraventricular tachycardia: Secondary | ICD-10-CM | POA: Diagnosis present

## 2018-03-13 DIAGNOSIS — I252 Old myocardial infarction: Secondary | ICD-10-CM | POA: Diagnosis not present

## 2018-03-13 DIAGNOSIS — Z8052 Family history of malignant neoplasm of bladder: Secondary | ICD-10-CM

## 2018-03-13 DIAGNOSIS — I77810 Thoracic aortic ectasia: Secondary | ICD-10-CM | POA: Diagnosis present

## 2018-03-13 DIAGNOSIS — Z7982 Long term (current) use of aspirin: Secondary | ICD-10-CM | POA: Diagnosis not present

## 2018-03-13 DIAGNOSIS — I213 ST elevation (STEMI) myocardial infarction of unspecified site: Secondary | ICD-10-CM | POA: Diagnosis not present

## 2018-03-13 DIAGNOSIS — I2119 ST elevation (STEMI) myocardial infarction involving other coronary artery of inferior wall: Secondary | ICD-10-CM | POA: Diagnosis not present

## 2018-03-13 DIAGNOSIS — E785 Hyperlipidemia, unspecified: Secondary | ICD-10-CM | POA: Diagnosis present

## 2018-03-13 DIAGNOSIS — Z23 Encounter for immunization: Secondary | ICD-10-CM | POA: Diagnosis present

## 2018-03-13 DIAGNOSIS — R001 Bradycardia, unspecified: Secondary | ICD-10-CM | POA: Diagnosis present

## 2018-03-13 DIAGNOSIS — Z951 Presence of aortocoronary bypass graft: Secondary | ICD-10-CM | POA: Diagnosis not present

## 2018-03-13 DIAGNOSIS — I441 Atrioventricular block, second degree: Secondary | ICD-10-CM | POA: Diagnosis present

## 2018-03-13 DIAGNOSIS — Z86010 Personal history of colonic polyps: Secondary | ICD-10-CM | POA: Diagnosis not present

## 2018-03-13 DIAGNOSIS — Z800 Family history of malignant neoplasm of digestive organs: Secondary | ICD-10-CM | POA: Diagnosis not present

## 2018-03-13 DIAGNOSIS — I083 Combined rheumatic disorders of mitral, aortic and tricuspid valves: Secondary | ICD-10-CM | POA: Diagnosis present

## 2018-03-13 DIAGNOSIS — I4891 Unspecified atrial fibrillation: Secondary | ICD-10-CM | POA: Diagnosis present

## 2018-03-13 DIAGNOSIS — I361 Nonrheumatic tricuspid (valve) insufficiency: Secondary | ICD-10-CM | POA: Diagnosis not present

## 2018-03-13 DIAGNOSIS — I340 Nonrheumatic mitral (valve) insufficiency: Secondary | ICD-10-CM | POA: Diagnosis not present

## 2018-03-13 DIAGNOSIS — K219 Gastro-esophageal reflux disease without esophagitis: Secondary | ICD-10-CM | POA: Diagnosis present

## 2018-03-13 DIAGNOSIS — I2111 ST elevation (STEMI) myocardial infarction involving right coronary artery: Secondary | ICD-10-CM | POA: Diagnosis present

## 2018-03-13 DIAGNOSIS — R079 Chest pain, unspecified: Secondary | ICD-10-CM | POA: Diagnosis present

## 2018-03-13 HISTORY — PX: LEFT HEART CATH AND CORS/GRAFTS ANGIOGRAPHY: CATH118250

## 2018-03-13 LAB — COMPREHENSIVE METABOLIC PANEL
ALT: 53 U/L — ABNORMAL HIGH (ref 0–44)
AST: 47 U/L — ABNORMAL HIGH (ref 15–41)
Albumin: 3.6 g/dL (ref 3.5–5.0)
Alkaline Phosphatase: 48 U/L (ref 38–126)
Anion gap: 9 (ref 5–15)
BUN: 19 mg/dL (ref 8–23)
CO2: 24 mmol/L (ref 22–32)
Calcium: 9 mg/dL (ref 8.9–10.3)
Chloride: 104 mmol/L (ref 98–111)
Creatinine, Ser: 1.16 mg/dL (ref 0.61–1.24)
Glucose, Bld: 116 mg/dL — ABNORMAL HIGH (ref 70–99)
Potassium: 4.8 mmol/L (ref 3.5–5.1)
Sodium: 137 mmol/L (ref 135–145)
Total Bilirubin: 0.4 mg/dL (ref 0.3–1.2)
Total Protein: 6.3 g/dL — ABNORMAL LOW (ref 6.5–8.1)

## 2018-03-13 LAB — CBC WITH DIFFERENTIAL/PLATELET
Abs Immature Granulocytes: 0.03 10*3/uL (ref 0.00–0.07)
Basophils Absolute: 0 10*3/uL (ref 0.0–0.1)
Basophils Relative: 0 %
Eosinophils Absolute: 0.2 10*3/uL (ref 0.0–0.5)
Eosinophils Relative: 2 %
HCT: 38.1 % — ABNORMAL LOW (ref 39.0–52.0)
Hemoglobin: 12.7 g/dL — ABNORMAL LOW (ref 13.0–17.0)
Immature Granulocytes: 0 %
Lymphocytes Relative: 13 %
Lymphs Abs: 1.5 10*3/uL (ref 0.7–4.0)
MCH: 30.8 pg (ref 26.0–34.0)
MCHC: 33.3 g/dL (ref 30.0–36.0)
MCV: 92.3 fL (ref 80.0–100.0)
MONOS PCT: 4 %
Monocytes Absolute: 0.4 10*3/uL (ref 0.1–1.0)
Neutro Abs: 9.6 10*3/uL — ABNORMAL HIGH (ref 1.7–7.7)
Neutrophils Relative %: 81 %
Platelets: 220 10*3/uL (ref 150–400)
RBC: 4.13 MIL/uL — ABNORMAL LOW (ref 4.22–5.81)
RDW: 12 % (ref 11.5–15.5)
WBC: 11.8 10*3/uL — ABNORMAL HIGH (ref 4.0–10.5)
nRBC: 0 % (ref 0.0–0.2)

## 2018-03-13 LAB — PROTIME-INR
INR: 1.09
Prothrombin Time: 14 seconds (ref 11.4–15.2)

## 2018-03-13 LAB — APTT: aPTT: 62 seconds — ABNORMAL HIGH (ref 24–36)

## 2018-03-13 LAB — LIPID PANEL
CHOLESTEROL: 168 mg/dL (ref 0–200)
HDL: 40 mg/dL — ABNORMAL LOW (ref >40)
LDL Cholesterol: 114 mg/dL — ABNORMAL HIGH (ref 0–99)
Total CHOL/HDL Ratio: 4.2 RATIO
Triglycerides: 71 mg/dL (ref <150)
VLDL: 14 mg/dL (ref 0–40)

## 2018-03-13 LAB — TROPONIN I: Troponin I: 0.07 ng/mL (ref <0.03)

## 2018-03-13 SURGERY — LEFT HEART CATH AND CORS/GRAFTS ANGIOGRAPHY
Anesthesia: LOCAL

## 2018-03-13 MED ORDER — SODIUM CHLORIDE 0.9% FLUSH: 3.0000 mL | INTRAVENOUS | Status: DC | PRN

## 2018-03-13 MED ORDER — LIDOCAINE HCL (PF) 1 % IJ SOLN
INTRAMUSCULAR | Status: DC | PRN
  Administered 2018-03-13: 12 mL

## 2018-03-13 MED ORDER — NITROGLYCERIN 1 MG/10 ML FOR IR/CATH LAB
INTRA_ARTERIAL | Status: AC
  Filled 2018-03-13: qty 10

## 2018-03-13 MED ORDER — CLOPIDOGREL BISULFATE 75 MG PO TABS
75.0000 mg | ORAL_TABLET | Freq: Every day | ORAL | Status: DC
  Administered 2018-03-14 – 2018-03-16 (×3): 75 mg via ORAL
  Filled 2018-03-13 (×4): qty 1

## 2018-03-13 MED ORDER — HEPARIN SODIUM (PORCINE) 5000 UNIT/ML IJ SOLN
4000.0000 [IU] | Freq: Once | INTRAMUSCULAR | Status: AC
  Administered 2018-03-13: 4000 [IU] via INTRAVENOUS
  Filled 2018-03-13: qty 1

## 2018-03-13 MED ORDER — ACETAMINOPHEN 325 MG PO TABS
650.0000 mg | ORAL_TABLET | ORAL | Status: DC | PRN
  Administered 2018-03-16: 650 mg via ORAL
  Filled 2018-03-13: qty 2

## 2018-03-13 MED ORDER — CLOPIDOGREL BISULFATE 300 MG PO TABS
ORAL_TABLET | ORAL | Status: AC
  Filled 2018-03-13: qty 1

## 2018-03-13 MED ORDER — NITROGLYCERIN IN D5W 200-5 MCG/ML-% IV SOLN
INTRAVENOUS | Status: AC | PRN
  Administered 2018-03-13: 10 ug/min via INTRAVENOUS

## 2018-03-13 MED ORDER — HEPARIN (PORCINE) IN NACL 1000-0.9 UT/500ML-% IV SOLN
INTRAVENOUS | Status: DC | PRN
  Administered 2018-03-13: 500 mL

## 2018-03-13 MED ORDER — IOHEXOL 350 MG/ML SOLN
INTRAVENOUS | Status: DC | PRN
  Administered 2018-03-13: 155 mL via INTRA_ARTERIAL

## 2018-03-13 MED ORDER — SODIUM CHLORIDE 0.9 % IV SOLN: 250.0000 mL | INTRAVENOUS | Status: DC | PRN

## 2018-03-13 MED ORDER — NITROPRUSSIDE SODIUM-NACL 20-0.9 MG/100ML-% IV SOLN
INTRAVENOUS | Status: AC
  Filled 2018-03-13: qty 100

## 2018-03-13 MED ORDER — MIDAZOLAM HCL 2 MG/2ML IJ SOLN
INTRAMUSCULAR | Status: DC | PRN
  Administered 2018-03-13: 2 mg via INTRAVENOUS
  Administered 2018-03-13: 1 mg via INTRAVENOUS

## 2018-03-13 MED ORDER — SODIUM CHLORIDE 0.9 % IV SOLN: INTRAVENOUS | Status: DC

## 2018-03-13 MED ORDER — PNEUMOCOCCAL VAC POLYVALENT 25 MCG/0.5ML IJ INJ
0.5000 mL | INJECTION | INTRAMUSCULAR | Status: AC
  Administered 2018-03-16: 0.5 mL via INTRAMUSCULAR
  Filled 2018-03-13: qty 0.5

## 2018-03-13 MED ORDER — HEPARIN SODIUM (PORCINE) 5000 UNIT/ML IJ SOLN
5000.0000 [IU] | Freq: Three times a day (TID) | INTRAMUSCULAR | Status: DC
  Administered 2018-03-14 – 2018-03-16 (×7): 5000 [IU] via SUBCUTANEOUS
  Filled 2018-03-13 (×6): qty 1

## 2018-03-13 MED ORDER — FENTANYL CITRATE (PF) 100 MCG/2ML IJ SOLN
INTRAMUSCULAR | Status: AC
  Filled 2018-03-13: qty 2

## 2018-03-13 MED ORDER — ONDANSETRON HCL 4 MG/2ML IJ SOLN: 4.0000 mg | Freq: Four times a day (QID) | INTRAMUSCULAR | Status: DC | PRN

## 2018-03-13 MED ORDER — ASPIRIN 81 MG PO CHEW
81.0000 mg | CHEWABLE_TABLET | Freq: Every day | ORAL | Status: DC
  Administered 2018-03-14 – 2018-03-16 (×3): 81 mg via ORAL
  Filled 2018-03-13 (×3): qty 1

## 2018-03-13 MED ORDER — DIPHENHYDRAMINE HCL 25 MG PO CAPS
25.0000 mg | ORAL_CAPSULE | Freq: Three times a day (TID) | ORAL | Status: DC | PRN
  Administered 2018-03-14 (×2): 25 mg via ORAL
  Filled 2018-03-13 (×2): qty 1

## 2018-03-13 MED ORDER — FENTANYL CITRATE (PF) 100 MCG/2ML IJ SOLN
INTRAMUSCULAR | Status: DC | PRN
  Administered 2018-03-13 (×2): 25 ug via INTRAVENOUS

## 2018-03-13 MED ORDER — CLOPIDOGREL BISULFATE 300 MG PO TABS
ORAL_TABLET | ORAL | Status: DC | PRN
  Administered 2018-03-13: 300 mg via ORAL

## 2018-03-13 MED ORDER — SODIUM CHLORIDE 0.9% FLUSH
3.0000 mL | Freq: Two times a day (BID) | INTRAVENOUS | Status: DC
  Administered 2018-03-14 – 2018-03-16 (×6): 3 mL via INTRAVENOUS

## 2018-03-13 MED ORDER — DIAZEPAM 5 MG PO TABS: 5.0000 mg | ORAL_TABLET | Freq: Four times a day (QID) | ORAL | Status: DC | PRN

## 2018-03-13 MED ORDER — LIDOCAINE HCL (PF) 1 % IJ SOLN
INTRAMUSCULAR | Status: AC
  Filled 2018-03-13: qty 30

## 2018-03-13 MED ORDER — MIDAZOLAM HCL 2 MG/2ML IJ SOLN
INTRAMUSCULAR | Status: AC
  Filled 2018-03-13: qty 2

## 2018-03-13 MED ORDER — SODIUM CHLORIDE 0.9 % IV SOLN
INTRAVENOUS | Status: AC | PRN
  Administered 2018-03-13: 10 mL/h via INTRAVENOUS

## 2018-03-13 MED ORDER — HEPARIN (PORCINE) IN NACL 1000-0.9 UT/500ML-% IV SOLN
INTRAVENOUS | Status: AC
  Filled 2018-03-13: qty 1000

## 2018-03-13 MED ORDER — INFLUENZA VAC SPLIT HIGH-DOSE 0.5 ML IM SUSY
0.5000 mL | PREFILLED_SYRINGE | INTRAMUSCULAR | Status: AC
  Administered 2018-03-16: 0.5 mL via INTRAMUSCULAR
  Filled 2018-03-13 (×4): qty 0.5

## 2018-03-13 MED ORDER — FAMOTIDINE IN NACL 20-0.9 MG/50ML-% IV SOLN
INTRAVENOUS | Status: AC
  Filled 2018-03-13: qty 50

## 2018-03-13 MED ORDER — FAMOTIDINE IN NACL 20-0.9 MG/50ML-% IV SOLN
INTRAVENOUS | Status: AC | PRN
  Administered 2018-03-13: 20 mg via INTRAVENOUS

## 2018-03-13 MED ORDER — ASPIRIN 81 MG PO CHEW
324.0000 mg | CHEWABLE_TABLET | Freq: Once | ORAL | Status: AC
  Administered 2018-03-13: 324 mg via ORAL
  Filled 2018-03-13: qty 4

## 2018-03-13 MED ORDER — SODIUM CHLORIDE 0.9 % IV SOLN
INTRAVENOUS | Status: DC
  Administered 2018-03-13 – 2018-03-14 (×2): via INTRAVENOUS

## 2018-03-13 SURGICAL SUPPLY — 10 items
CATH INFINITI 5 FR IM (CATHETERS) ×1 IMPLANT
CATH INFINITI 5 FR RCB (CATHETERS) ×1 IMPLANT
CATH INFINITI 5FR MULTPACK ANG (CATHETERS) ×1 IMPLANT
KIT HEART LEFT (KITS) ×2 IMPLANT
PACK CARDIAC CATHETERIZATION (CUSTOM PROCEDURE TRAY) ×2 IMPLANT
SHEATH PINNACLE 6F 10CM (SHEATH) ×1 IMPLANT
SYR MEDRAD MARK 7 150ML (SYRINGE) ×2 IMPLANT
TRANSDUCER W/STOPCOCK (MISCELLANEOUS) ×2 IMPLANT
TUBING CIL FLEX 10 FLL-RA (TUBING) ×2 IMPLANT
WIRE EMERALD 3MM-J .035X150CM (WIRE) ×1 IMPLANT

## 2018-03-13 NOTE — Progress Notes (Signed)
Pt transported from Cath lab to 2H13 on zoll monitor, nitro gtt & famotidine infusing, HR 40s-50s. Pt alert & oriented x4, reports no pain. Care assumed at this time.

## 2018-03-13 NOTE — Progress Notes (Signed)
Report received from Cath Lab Modesta Messing. Awaiting pt transfer from cath lab to 2H13.

## 2018-03-13 NOTE — ED Provider Notes (Signed)
[facility] EMERGENCY DEPARTMENT Provider Note   CSN: [id number] Arrival date & time: 03/13/18  1858     History   Chief Complaint Chief Complaint  Patient presents with  . Chest Pain  . Code STEMI    HPI Steven Mathews is a 73 y.o. male.  HPI Patient developed central chest tightness this afternoon while working outside.  Associated with nausea.  Describes tightness as 6 of 10 in intensity.  Has history of CAD-post CABG in 2008. Past Medical History:  Diagnosis Date  . Atrial fibrillation ([place])   . CAD (coronary artery disease)   . Chronic cough   . GERD (gastroesophageal reflux disease)   . Hx of adenomatous colonic polyps 04/04/2016  . Hyperlipidemia   . Hypertension   . Myocardial infarct ([place])   . SVT (supraventricular tachycardia) [facility])     Patient Active Problem List   Diagnosis Date Noted  . Hx of adenomatous colonic polyps 04/04/2016  . Coronary atherosclerosis of artery bypass graft 04/13/2013  . 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 ED (erectile dysfunction) 10/05/2010  . PALPITATIONS 08/29/2008  . BRONCHITIS, ACUTE 06/24/2008  . HYPERLIPIDEMIA 04/14/2008  . PULMONARY INFILTRATE INCLUDES (EOSINOPHILIA) 04/14/2008    Past Surgical History:  Procedure Laterality Date  . CORONARY ARTERY BYPASS GRAFT  03/2006  . SUPRAVENTRICULAR TACHYCARDIA ABLATION N/A 09/03/2012   Procedure: SUPRAVENTRICULAR TACHYCARDIA ABLATION;  Surgeon: Evans Lance, MD;  Location: [facility] CATH LAB;  Service: Cardiovascular;  Laterality: N/A;        Home Medications    Prior to Admission medications   Medication Sig Start Date End Date Taking? Authorizing Provider  aspirin EC 81 MG tablet Take 81 mg by mouth daily.    [provider]  atorvastatin (LIPITOR) 20 MG tablet TAKE 1 TABLET(20 MG) BY MOUTH DAILY 11/20/17   Evans Lance, MD  metoprolol tartrate (LOPRESSOR) 25 MG tablet TAKE 1/2 TABLET BY MOUTH TWICE DAILY. MAY INCREASE TO 1  TABLET TWICE DAILY 11/20/17   Evans Lance, MD    Family History Family History  Problem Relation Age of Onset  . Liver cancer Father   . Cancer Mother        bladder cancer  . Colon cancer Brother 27    Social History Social History   Tobacco Use  . Smoking status: Never Smoker  . Smokeless tobacco: Never Used  Substance Use Topics  . Alcohol use: Yes    Alcohol/week: 7.0 standard drinks    Types: 7 Glasses of wine per week    Comment: occasional  . Drug use: No     Allergies   Patient has no known allergies.   Review of Systems Review of Systems  Constitutional: Negative for chills and fever.  HENT: Negative for sore throat and trouble swallowing.   Respiratory: Positive for chest tightness. Negative for shortness of breath.   Cardiovascular: Positive for chest pain.  Gastrointestinal: Positive for nausea. Negative for abdominal pain and diarrhea.  Genitourinary: Negative for flank pain, frequency and hematuria.  Musculoskeletal: Negative for back pain, myalgias and neck pain.  Skin: Negative for rash and wound.  Neurological: Negative for dizziness, weakness, light-headedness, numbness and headaches.  All other systems reviewed and are negative.    Physical Exam Updated Vital Signs BP (!) 143/82   Pulse (!) 38   Temp 98 F (36.7 C) (Oral)   Resp 16   Ht 6' (1.829 m)   Wt 86.2 kg  SpO2 100%   BMI 25.77 kg/m   Physical Exam Vitals signs and nursing note reviewed.  Constitutional:      Appearance: Normal appearance. He is well-developed.  HENT:     Head: Normocephalic and atraumatic.     Nose: Nose normal.     Mouth/Throat:     Mouth: Mucous membranes are moist.  Eyes:     Extraocular Movements: Extraocular movements intact.     Pupils: Pupils are equal, round, and reactive to light.  Neck:     Musculoskeletal: Normal range of motion and neck supple.  Cardiovascular:     Rate and Rhythm: Normal rate and regular rhythm.     Heart sounds:  No murmur. No friction rub. No gallop.   Pulmonary:     Effort: Pulmonary effort is normal. No respiratory distress.     Breath sounds: Normal breath sounds. No stridor. No wheezing, rhonchi or rales.  Chest:     Chest wall: No tenderness.  Abdominal:     General: Bowel sounds are normal.     Palpations: Abdomen is soft.     Tenderness: There is no abdominal tenderness. There is no guarding or rebound.  Musculoskeletal: Normal range of motion.        General: No swelling, tenderness, deformity or signs of injury.     Right lower leg: No edema.     Left lower leg: No edema.  Skin:    General: Skin is warm and dry.     Capillary Refill: Capillary refill takes less than 2 seconds.     Findings: No erythema or rash.  Neurological:     General: No focal deficit present.     Mental Status: He is alert and oriented to person, place, and time.  Psychiatric:        Mood and Affect: Mood normal.        Behavior: Behavior normal.      ED Treatments / Results  Labs (all labs ordered are listed, but only abnormal results are displayed) Labs Reviewed  CBC WITH DIFFERENTIAL/PLATELET  PROTIME-INR  APTT  COMPREHENSIVE METABOLIC PANEL  TROPONIN I  LIPID PANEL    EKG EKG Interpretation  Date/Time:  Friday March 13 2018 19:06:31 EST Ventricular Rate:  52 PR Interval:    QRS Duration: 148 QT Interval:  484 QTC Calculation: 446 R Axis:   22 Text Interpretation:  Second degree AV block, Mobitz II Atrial premature complexes Right bundle branch block Inferior infarct, acute (RCA) Probable RV involvement, suggest recording right precordial leads Baseline wander in lead(s) V4 >>> Acute MI <<< Confirmed by Julianne Rice [phone]) on 03/13/2018 7:15:51 PM   Radiology Dg Chest Port 1 View  Result Date: 03/13/2018 CLINICAL DATA:  73 y/o  M; sudden onset chest pain and nausea. EXAM: PORTABLE CHEST 1 VIEW COMPARISON:  06/24/2008 chest radiograph FINDINGS: Stable cardiac silhouette within  normal limits given projection and technique. Post CABG with sternotomy wires in alignment. Aortic atherosclerosis with calcification. Clear lungs. No pleural effusion or pneumothorax. No acute osseous abnormality is evident. IMPRESSION: No acute pulmonary process identified. Electronically Signed   By: Kristine Garbe M.D.   On: 03/13/2018 19:51    Procedures Procedures (including critical care time)  Medications Ordered in ED Medications  0.9 %  sodium chloride infusion (has no administration in time range)  Heparin (Porcine) in NaCl 1000-0.9 UT/500ML-% SOLN (500 mLs  Given 03/13/18 1948)  Heparin (Porcine) in NaCl 1000-0.9 UT/500ML-% SOLN (500 mLs  Given 03/13/18  1948)  aspirin chewable tablet 324 mg (324 mg Oral Given 03/13/18 1927)  heparin injection 4,000 Units (4,000 Units Intravenous Given 03/13/18 1932)   CRITICAL CARE Performed by: Julianne Rice Total critical care time: 25 minutes Critical care time was exclusive of separately billable procedures and treating other patients. Critical care was necessary to treat or prevent imminent or life-threatening deterioration. Critical care was time spent personally by me on the following activities: development of treatment plan with patient and/or surrogate as well as nursing, discussions with consultants, evaluation of patient's response to treatment, examination of patient, obtaining history from patient or surrogate, ordering and performing treatments and interventions, ordering and review of laboratory studies, ordering and review of radiographic studies, pulse oximetry and re-evaluation of patient's condition.  Initial Impression / Assessment and Plan / ED Course  I have reviewed the triage vital signs and the nursing notes.  Pertinent labs & imaging results that were available during my care of the patient were reviewed by me and considered in my medical decision making (see chart for details).     Code STEMI was initiated  immediately after review of patient's EKG.  Will start heparin and give aspirin.  Dr. Claiborne Billings reviewed patient's EKG and will plan to take to Cath Lab.  Final Clinical Impressions(s) / ED Diagnoses   Final diagnoses:  Acute ST elevation myocardial infarction (STEMI), unspecified artery [facility])    ED Discharge Orders    None       Julianne Rice, MD 03/13/18 2001

## 2018-03-13 NOTE — ED Notes (Signed)
Transported to cath lab with Emilie Rutter, RN and [place], RN

## 2018-03-13 NOTE — ED Triage Notes (Signed)
Pt arrives via POV with reports of sudden onset cp today associated with nausea. Pt with hx MI.

## 2018-03-13 NOTE — H&P (Addendum)
Cardiology Admission History and Physical:   Patient ID: Steven Mathews MRN: [id number]; DOB: [birth year]   Admission date: 03/13/2018  Primary Care Provider: Cathlean Sauer, MD Primary Cardiologist: Minus Breeding, MD Primary Electrophysiologist:  Cristopher Peru, MD   Chief Complaint:  Chest pain  Patient Profile:   Steven Mathews is a 73 y.o. male with past medical history of CAD s/p CABG, GERD, hypertension, hyperlipidemia, history of SVT s/p ablation presented with chest pain. Initial EKG concerning of inferior STEMI  History of Present Illness:   Steven Mathews is a 73 year old male with past medical history of CAD s/p CABG, GERD, hypertension, hyperlipidemia, history of SVT s/p ablation.  Last echocardiogram obtained on 10/17/2015 showed EF 50 to 55%, mild LVH, grade 1 DD.  Although atrial fibrillation was listed as his past medical history, however he is not on any systemic anticoagulation therapy nor is there any documented atrial fibrillation on previous cardiology note.  Patient was in his usual state of health until 4:45 PM this afternoon when he started having diffuse anterior chest discomfort more lateral to the right side.  The chest discomfort is not similar to the previous MI, however EKG showed Wenckebach rhythm with ST elevation in the inferior leads, right bundle branch block.  ED work-up is currently pending.  Cardiology consulted for possible inferior STEMI.  According to the patient, he has not taking his aspirin for this past week.   Past Medical History:  Diagnosis Date  . Atrial fibrillation ([place])   . CAD (coronary artery disease)   . Chronic cough   . GERD (gastroesophageal reflux disease)   . Hx of adenomatous colonic polyps 04/04/2016  . Hyperlipidemia   . Hypertension   . Myocardial infarct ([place])   . SVT (supraventricular tachycardia) (HCC)     Past Surgical History:  Procedure Laterality Date  . CORONARY ARTERY BYPASS GRAFT  03/2006  . SUPRAVENTRICULAR TACHYCARDIA  ABLATION N/A 09/03/2012   Procedure: SUPRAVENTRICULAR TACHYCARDIA ABLATION;  Surgeon: Evans Lance, MD;  Location: [facility] CATH LAB;  Service: Cardiovascular;  Laterality: N/A;     Medications Prior to Admission: Prior to Admission medications   Medication Sig Start Date End Date Taking? Authorizing Provider  aspirin EC 81 MG tablet Take 81 mg by mouth daily.    [provider]  atorvastatin (LIPITOR) 20 MG tablet TAKE 1 TABLET(20 MG) BY MOUTH DAILY 11/20/17   Evans Lance, MD  metoprolol tartrate (LOPRESSOR) 25 MG tablet TAKE 1/2 TABLET BY MOUTH TWICE DAILY. MAY INCREASE TO 1 TABLET TWICE DAILY 11/20/17   Evans Lance, MD     Allergies:   No Known Allergies  Social History:   Social History   Socioeconomic History  . Marital status: Married    Spouse name: Not on file  . Number of children: Not on file  . Years of education: Not on file  . Highest education level: Not on file  Occupational History  . Not on file  Social Needs  . Financial resource strain: Not on file  . Food insecurity:    Worry: Not on file    Inability: Not on file  . Transportation needs:    Medical: Not on file    Non-medical: Not on file  Tobacco Use  . Smoking status: Never Smoker  . Smokeless tobacco: Never Used  Substance and Sexual Activity  . Alcohol use: Yes    Alcohol/week: 7.0 standard drinks    Types: 7 Glasses of wine per week  Comment: occasional  . Drug use: No  . Sexual activity: Not on file  Lifestyle  . Physical activity:    Days per week: Not on file    Minutes per session: Not on file  . Stress: Not on file  Relationships  . Social connections:    Talks on phone: Not on file    Gets together: Not on file    Attends religious service: Not on file    Active member of club or organization: Not on file    Attends meetings of clubs or organizations: Not on file    Relationship status: Not on file  . Intimate partner violence:    Fear of current or ex partner: Not  on file    Emotionally abused: Not on file    Physically abused: Not on file    Forced sexual activity: Not on file  Other Topics Concern  . Not on file  Social History Narrative  . Not on file    Family History:   The patient's family history includes Cancer in his mother; Colon cancer (age of onset: 19) in his brother; Liver cancer in his father.    ROS:  Please see the history of present illness.  All other ROS reviewed and negative.     Physical Exam/Data:   Vitals:   03/13/18 1918 03/13/18 1920 03/13/18 1935 03/13/18 1941  BP: (!) 156/90   (!) 143/82  Pulse: (!) 45  86 (!) 38  Resp: 14  13 16   Temp: 98 F (36.7 C)     TempSrc: Oral     SpO2: 100%  97% 100%  Weight:  86.2 kg    Height:  6' (1.829 m)     No intake or output data in the 24 hours ending 03/13/18 2005 Last 3 Weights 03/13/2018 10/08/2017 11/27/2016  Weight (lbs) 190 lb 190 lb 12.8 oz 200 lb  Weight (kg) 86.183 kg 86.546 kg 90.719 kg     Body mass index is 25.77 kg/m.  General:  Well nourished, well developed, in no acute distress HEENT: normal Lymph: no adenopathy Neck: no JVD Endocrine:  No thryomegaly Vascular: No carotid bruits; FA pulses 2+ bilaterally without bruits  Cardiac:  normal S1, S2; RRR; no murmur  Lungs:  clear to auscultation bilaterally, no wheezing, rhonchi or rales  Abd: soft, nontender, no hepatomegaly  Ext: no edema Musculoskeletal:  No deformities, BUE and BLE strength normal and equal Skin: warm and dry  Neuro:  CNs 2-12 intact, no focal abnormalities noted Psych:  Normal affect    EKG:  The ECG that was done in the ED wich was personally reviewed and demonstrates Wenckebach rhythm with ST elevation in the inferior leads and right bundle branch block  Relevant CV Studies:  Echo 10/17/2015 LV EF: 50% -   55% Study Conclusions  - Left ventricle: The cavity size was normal. Wall thickness was   increased in a pattern of mild LVH. Systolic function was normal.   The  estimated ejection fraction was in the range of 50% to 55%.   Wall motion was normal; there were no regional wall motion   abnormalities. Doppler parameters are consistent with abnormal   left ventricular relaxation (grade 1 diastolic dysfunction). - Left atrium: The atrium was moderately dilated. - Right atrium: The atrium was mildly dilated.  Impressions:  - Low normal LV systolic function; grade 1 diastolic dysfunction;   mild LVH; biatrial enlargement; mild TR.   Laboratory Data:  ChemistryNo  results for input(s): NA, K, CL, CO2, GLUCOSE, BUN, CREATININE, CALCIUM, GFRNONAA, GFRAA, ANIONGAP in the last 168 hours.  No results for input(s): PROT, ALBUMIN, AST, ALT, ALKPHOS, BILITOT in the last 168 hours. HematologyNo results for input(s): WBC, RBC, HGB, HCT, MCV, MCH, MCHC, RDW, PLT in the last 168 hours. Cardiac EnzymesNo results for input(s): TROPONINI in the last 168 hours. No results for input(s): TROPIPOC in the last 168 hours.  BNPNo results for input(s): BNP, PROBNP in the last 168 hours.  DDimer No results for input(s): DDIMER in the last 168 hours.  Radiology/Studies:  Dg Chest Port 1 View  Result Date: 03/13/2018 CLINICAL DATA:  73 y/o  M; sudden onset chest pain and nausea. EXAM: PORTABLE CHEST 1 VIEW COMPARISON:  06/24/2008 chest radiograph FINDINGS: Stable cardiac silhouette within normal limits given projection and technique. Post CABG with sternotomy wires in alignment. Aortic atherosclerosis with calcification. Clear lungs. No pleural effusion or pneumothorax. No acute osseous abnormality is evident. IMPRESSION: No acute pulmonary process identified. Electronically Signed   By: Kristine Garbe M.D.   On: 03/13/2018 19:51    Assessment and Plan:   1. Chest pain: EKG concerning for inferior STEMI.  Undergoing urgent cardiac catheterization.   2. CAD s/p CABG: Unclear anatomy due to lack of records.  He has not taken any aspirin for the past  week  3. Hypertension  4. Hyperlipidemia  5. History of SVT s/p ablation  Severity of Illness: The appropriate patient status for this patient is INPATIENT. Inpatient status is judged to be reasonable and necessary in order to provide the required intensity of service to ensure the patient's safety. The patient's presenting symptoms, physical exam findings, and initial radiographic and laboratory data in the context of their chronic comorbidities is felt to place them at high risk for further clinical deterioration. Furthermore, it is not anticipated that the patient will be medically stable for discharge from the hospital within 2 midnights of admission. The following factors support the patient status of inpatient.   " The patient's presenting symptoms include Chest pain. " The worrisome physical exam findings include benign. " The initial radiographic and laboratory data are worrisome because of EKG showed ST elevation in inferior leads. " The chronic co-morbidities include h/o CABG, HTN, HLD.   * I certify that at the point of admission it is my clinical judgment that the patient will require inpatient hospital care spanning beyond 2 midnights from the point of admission due to high intensity of service, high risk for further deterioration and high frequency of surveillance required.*    For questions or updates, please contact [place] Please consult [email or web address] for contact info under    Hilbert Corrigan, [place]  03/13/2018 8:05 PM    Patient seen and examined. Agree with assessment and plan.  Mr. Ryo Klang is a 73 year old gentleman who underwent CABG revascularization surgery x3 which was done in [place].  Does not know the specifics of his graft.  There is no information in the medical history regarding his graft specifics.  He has a history of hypertension, hyperlipidemia, as well as a history of SVT and is status post ablation.  He also has had issues with some  slow heart rate and intermittent heart block and apparently there has been some discussion regarding potential future need for pacemaker insertion.  Over the past week, he ran out of his aspirin and has not been on any antiplatelet therapy.  He has continued to take low-dose  metoprolol 12.5 mg twice a day in addition to atorvastatin 20 mg.  Over the past several days he has developed a rash on his chest.  He apparently had a skin biopsy early this morning.  He was doing work around his house that he is rebuilding and approximately 4:45 PM developed substernal chest tightness and pressure prompting his ER evaluation.  In the emergency room, his ECG showed sinus rhythm with right bundle branch block and repolarization changes but also suggested subtle inferior ST elevation.  A inferior code STEMI was activated.  Patient was treated with IV heparin.  Chest pain was 5 out of 10 in intensity.  He is now brought urgently to the cardiac catheterization laboratory for emergent catheterization and possible intervention.   Troy Sine, MD, [facility] 03/13/2018 8:48 PM

## 2018-03-14 ENCOUNTER — Encounter (HOSPITAL_COMMUNITY): Payer: Self-pay

## 2018-03-14 LAB — CBC
HCT: 36.6 % — ABNORMAL LOW (ref 39.0–52.0)
Hemoglobin: 12.4 g/dL — ABNORMAL LOW (ref 13.0–17.0)
MCH: 31.5 pg (ref 26.0–34.0)
MCHC: 33.9 g/dL (ref 30.0–36.0)
MCV: 92.9 fL (ref 80.0–100.0)
PLATELETS: 213 10*3/uL (ref 150–400)
RBC: 3.94 MIL/uL — ABNORMAL LOW (ref 4.22–5.81)
RDW: 12.3 % (ref 11.5–15.5)
WBC: 11 10*3/uL — ABNORMAL HIGH (ref 4.0–10.5)
nRBC: 0 % (ref 0.0–0.2)

## 2018-03-14 LAB — BASIC METABOLIC PANEL
Anion gap: 8 (ref 5–15)
BUN: 17 mg/dL (ref 8–23)
CHLORIDE: 108 mmol/L (ref 98–111)
CO2: 23 mmol/L (ref 22–32)
Calcium: 8.5 mg/dL — ABNORMAL LOW (ref 8.9–10.3)
Creatinine, Ser: 1.14 mg/dL (ref 0.61–1.24)
GFR calc Af Amer: >60 mL/min (ref >60)
GFR calc non Af Amer: >60 mL/min (ref >60)
Glucose, Bld: 132 mg/dL — ABNORMAL HIGH (ref 70–99)
Potassium: 4.8 mmol/L (ref 3.5–5.1)
SODIUM: 139 mmol/L (ref 135–145)

## 2018-03-14 LAB — TROPONIN I
Troponin I: 3.17 ng/mL (ref <0.03)
Troponin I: 8.62 ng/mL (ref <0.03)
Troponin I: 13.03 ng/mL (ref <0.03)

## 2018-03-14 LAB — MRSA PCR SCREENING: MRSA by PCR: NEGATIVE

## 2018-03-14 MED ORDER — ATORVASTATIN CALCIUM 80 MG PO TABS
80.0000 mg | ORAL_TABLET | Freq: Every day | ORAL | Status: DC
  Administered 2018-03-14 – 2018-03-16 (×3): 80 mg via ORAL
  Filled 2018-03-14 (×3): qty 1

## 2018-03-14 MED ORDER — DIPHENHYDRAMINE HCL 25 MG PO CAPS
25.0000 mg | ORAL_CAPSULE | Freq: Four times a day (QID) | ORAL | Status: DC | PRN
  Administered 2018-03-14 – 2018-03-15 (×3): 25 mg via ORAL
  Filled 2018-03-14 (×3): qty 1

## 2018-03-14 MED ORDER — HYDROCORTISONE 1 % EX CREA
1.0000 "application " | TOPICAL_CREAM | Freq: Three times a day (TID) | CUTANEOUS | Status: DC | PRN
  Administered 2018-03-14 (×2): 1 via TOPICAL
  Filled 2018-03-14: qty 28

## 2018-03-14 NOTE — Progress Notes (Signed)
CRITICAL VALUE ALERT  Critical Value:  Troponin 3.17  Date & Time Notied:  03/14/2018 0813G  Provider Notified: Dr. Juliane Lack Cardiology  Orders Received/Actions taken: Continue to trend Troponin levels.

## 2018-03-14 NOTE — Plan of Care (Signed)
Patient is progressing toward all care goals.  He transferred from [facility] today as he moves toward discharge.  No MIV fluids.  No c/o pain or discomfort.  Pt is in NSR per tele.  Will continue to monitor.

## 2018-03-14 NOTE — Progress Notes (Signed)
   03/14/18 0500  Clinical Encounter Type  Visited With Patient  Visit Type ED  Patient was alert and sent to Cath Lab. Provided the ministry of presence.

## 2018-03-14 NOTE — Progress Notes (Signed)
Progress Note  Patient Name: Christoher Drudge Date of Encounter: 03/14/2018  Primary Cardiologist: Minus Breeding, MD   Subjective   Mild chest pressure 1-2/10  Inpatient Medications    Scheduled Meds: . aspirin  81 mg Oral Daily  . clopidogrel  75 mg Oral Q breakfast  . heparin  5,000 Units Subcutaneous Q8H  . Influenza vac split quadrivalent PF  0.5 mL Intramuscular Tomorrow-1000  . pneumococcal 23 valent vaccine  0.5 mL Intramuscular Tomorrow-1000  . sodium chloride flush  3 mL Intravenous Q12H   Continuous Infusions: . sodium chloride    . sodium chloride 100 mL/hr at 03/14/18 0900  . sodium chloride     PRN Meds: sodium chloride, acetaminophen, diazepam, diphenhydrAMINE, hydrocortisone cream, ondansetron (ZOFRAN) IV, sodium chloride flush   Vital Signs    Vitals:   03/14/18 0600 03/14/18 0700 03/14/18 0800 03/14/18 0817  BP: (!) 104/55 (!) 107/55 (!) 106/51   Pulse: (!) 58 63 (!) 29   Resp: (!) 22 (!) 21 (!) 21   Temp:    98.5 F (36.9 C)  TempSrc:    Oral  SpO2: 95% 94% 95%   Weight:      Height:        Intake/Output Summary (Last 24 hours) at 03/14/2018 0943 Last data filed at 03/14/2018 0900 Gross per 24 hour  Intake 1958.4 ml  Output -  Net 1958.4 ml   Last 3 Weights 03/13/2018 03/13/2018 10/08/2017  Weight (lbs) 192 lb 10.9 oz 190 lb 190 lb 12.8 oz  Weight (kg) 87.4 kg 86.183 kg 86.546 kg      Telemetry    SR, wenchebach block - Personally Reviewed  ECG    na  Physical Exam   GEN: No acute distress.   Neck: No JVD Cardiac: RRR, no murmurs, rubs, or gallops.  Respiratory: Clear to auscultation bilaterally. GI: Soft, nontender, non-distended  MS: No edema; No deformity. Neuro:  Nonfocal  Psych: Normal affect   Labs    Chemistry Recent Labs  Lab 03/13/18 2055 03/14/18 0429  NA 137 139  K 4.8 4.8  CL 104 108  CO2 24 23  GLUCOSE 116* 132*  BUN 19 17  CREATININE 1.16 1.14  CALCIUM 9.0 8.5*  PROT 6.3*  --   ALBUMIN 3.6  --   AST 47*   --   ALT 53*  --   ALKPHOS 48  --   BILITOT 0.4  --   GFRNONAA >60 >60  GFRAA >60 >60  ANIONGAP 9 8     Hematology Recent Labs  Lab 03/13/18 2055 03/14/18 0429  WBC 11.8* 11.0*  RBC 4.13* 3.94*  HGB 12.7* 12.4*  HCT 38.1* 36.6*  MCV 92.3 92.9  MCH 30.8 31.5  MCHC 33.3 33.9  RDW 12.0 12.3  PLT 220 213    Cardiac Enzymes Recent Labs  Lab 03/13/18 2055 03/14/18 0429  TROPONINI 0.07* 3.17*   No results for input(s): TROPIPOC in the last 168 hours.   BNPNo results for input(s): BNP, PROBNP in the last 168 hours.   DDimer No results for input(s): DDIMER in the last 168 hours.   Radiology    Dg Chest Port 1 View  Result Date: 03/13/2018 CLINICAL DATA:  73 y/o  M; sudden onset chest pain and nausea. EXAM: PORTABLE CHEST 1 VIEW COMPARISON:  06/24/2008 chest radiograph FINDINGS: Stable cardiac silhouette within normal limits given projection and technique. Post CABG with sternotomy wires in alignment. Aortic atherosclerosis with calcification. Clear lungs. No  pleural effusion or pneumothorax. No acute osseous abnormality is evident. IMPRESSION: No acute pulmonary process identified. Electronically Signed   By: Kristine Garbe M.D.   On: 03/13/2018 19:51    Cardiac Studies     Patient Profile     Arael Piccione is a 73 y.o. male with past medical history of CAD s/p CABG, GERD, hypertension, hyperlipidemia, history of SVT s/p ablation presented with chest pain. Initial EKG concerning of inferior STEMI  Assessment & Plan    1. Chest pain - EKG concerning for STEMI, sent for cath yesterday - cath with LAD 40%, LCX 55%, OM1 70%, RCA occluded. LIMA-LAD patent SVG-OM1 occluded, SVG-RPDA occluded. From cath note inferior STEMI thought due to extensive thrombus in occluded vein graft to RCA. Patient had been on ASA x 1 week - recs for medical therapy - echo pending. Trops have not peaked yet, continue to trend.   - medical therapy with ASA 81 mg, plavix 75mg ,      2. Mobitz I second degree AV block - in setting of inferior MI, initial rates in 40s - from notes prior outpatient monitors have shown mobitz 2 block. Watchful waiting by EP - avoid av nodal agents for now, restart beta blocker likely tomorrow    3. History of SVT - history of prior ablation but with recurrence.  - beta blocker on hold due to bradycardia - likely restart tomorrow   For questions or updates, please contact [place] Please consult [email or web address] for contact info under        Signed, Carlyle Dolly, MD  03/14/2018, 9:43 AM

## 2018-03-15 ENCOUNTER — Other Ambulatory Visit (HOSPITAL_COMMUNITY): Payer: Medicare Other

## 2018-03-15 ENCOUNTER — Inpatient Hospital Stay (HOSPITAL_COMMUNITY): Payer: Medicare Other

## 2018-03-15 DIAGNOSIS — I340 Nonrheumatic mitral (valve) insufficiency: Secondary | ICD-10-CM

## 2018-03-15 DIAGNOSIS — I361 Nonrheumatic tricuspid (valve) insufficiency: Secondary | ICD-10-CM

## 2018-03-15 LAB — ECHOCARDIOGRAM COMPLETE
Height: 72 in
Weight: 3082.91 oz

## 2018-03-15 LAB — TROPONIN I: Troponin I: 21.18 ng/mL (ref <0.03)

## 2018-03-15 LAB — POCT ACTIVATED CLOTTING TIME: Activated Clotting Time: 136 seconds

## 2018-03-15 LAB — POCT I-STAT CREATININE: CREATININE: 1 mg/dL (ref 0.61–1.24)

## 2018-03-15 MED ORDER — METOPROLOL TARTRATE 12.5 MG HALF TABLET
12.5000 mg | ORAL_TABLET | Freq: Two times a day (BID) | ORAL | Status: DC
  Administered 2018-03-15 – 2018-03-16 (×2): 12.5 mg via ORAL
  Filled 2018-03-15 (×3): qty 1

## 2018-03-15 NOTE — Progress Notes (Signed)
RN noted changed arrhythmia on tele, completed EKG which showed NSR with 2nd degree AV block (Mobitz 1). Patient stable, MD on call paged.

## 2018-03-15 NOTE — Progress Notes (Signed)
  Echocardiogram 2D Echocardiogram has been performed.  Steven Mathews 03/15/2018, 2:51 PM

## 2018-03-15 NOTE — Progress Notes (Signed)
Progress Note  Patient Name: Steven Mathews Date of Encounter: 03/15/2018  Primary Cardiologist: Minus Breeding, MD   Subjective     Inpatient Medications    Scheduled Meds: . aspirin  81 mg Oral Daily  . atorvastatin  80 mg Oral q1800  . clopidogrel  75 mg Oral Q breakfast  . heparin  5,000 Units Subcutaneous Q8H  . Influenza vac split quadrivalent PF  0.5 mL Intramuscular Tomorrow-1000  . pneumococcal 23 valent vaccine  0.5 mL Intramuscular Tomorrow-1000  . sodium chloride flush  3 mL Intravenous Q12H   Continuous Infusions: . sodium chloride    . sodium chloride Stopped (03/14/18 1330)  . sodium chloride     PRN Meds: sodium chloride, acetaminophen, diazepam, diphenhydrAMINE, hydrocortisone cream, ondansetron (ZOFRAN) IV, sodium chloride flush   Vital Signs    Vitals:   03/14/18 1156 03/14/18 1300 03/14/18 2300 03/15/18 0822  BP:   120/61 114/68  Pulse:   70 81  Resp:   (!) 61 17  Temp: 98.4 F (36.9 C) 98.3 F (36.8 C) 98.4 F (36.9 C) 100.2 F (37.9 C)  TempSrc: Oral Oral Oral Oral  SpO2:   95% 94%  Weight:      Height:        Intake/Output Summary (Last 24 hours) at 03/15/2018 1030 Last data filed at 03/15/2018 0254 Gross per 24 hour  Intake 120 ml  Output 0 ml  Net 120 ml   Last 3 Weights 03/13/2018 03/13/2018 10/08/2017  Weight (lbs) 192 lb 10.9 oz 190 lb 190 lb 12.8 oz  Weight (kg) 87.4 kg 86.183 kg 86.546 kg      Telemetry    SR and nonconducted PACs - Personally Reviewed  ECG    na  Physical Exam   GEN: No acute distress.   Neck: No JVD Cardiac: RRR, no murmurs, rubs, or gallops.  Respiratory: Clear to auscultation bilaterally. GI: Soft, nontender, non-distended  MS: No edema; No deformity. Neuro:  Nonfocal  Psych: Normal affect   Labs    Chemistry Recent Labs  Lab 03/13/18 2055 03/14/18 0429  NA 137 139  K 4.8 4.8  CL 104 108  CO2 24 23  GLUCOSE 116* 132*  BUN 19 17  CREATININE 1.16 1.14  CALCIUM 9.0 8.5*  PROT 6.3*  --    ALBUMIN 3.6  --   AST 47*  --   ALT 53*  --   ALKPHOS 48  --   BILITOT 0.4  --   GFRNONAA >60 >60  GFRAA >60 >60  ANIONGAP 9 8     Hematology Recent Labs  Lab 03/13/18 2055 03/14/18 0429  WBC 11.8* 11.0*  RBC 4.13* 3.94*  HGB 12.7* 12.4*  HCT 38.1* 36.6*  MCV 92.3 92.9  MCH 30.8 31.5  MCHC 33.3 33.9  RDW 12.0 12.3  PLT 220 213    Cardiac Enzymes Recent Labs  Lab 03/13/18 2055 03/14/18 0429 03/14/18 1032 03/14/18 1606  TROPONINI 0.07* 3.17* 8.62* 13.03*   No results for input(s): TROPIPOC in the last 168 hours.   BNPNo results for input(s): BNP, PROBNP in the last 168 hours.   DDimer No results for input(s): DDIMER in the last 168 hours.   Radiology    Dg Chest Port 1 View  Result Date: 03/13/2018 CLINICAL DATA:  73 y/o  M; sudden onset chest pain and nausea. EXAM: PORTABLE CHEST 1 VIEW COMPARISON:  06/24/2008 chest radiograph FINDINGS: Stable cardiac silhouette within normal limits given projection and technique. Post  CABG with sternotomy wires in alignment. Aortic atherosclerosis with calcification. Clear lungs. No pleural effusion or pneumothorax. No acute osseous abnormality is evident. IMPRESSION: No acute pulmonary process identified. Electronically Signed   By: Kristine Garbe M.D.   On: 03/13/2018 19:51    Cardiac Studies     Patient Profile     Manjinder Breau a 73 y.o.malewith past medical history of CADs/p CABG, GERD, hypertension, hyperlipidemia, history of SVT s/pablationpresented with chest pain. Initial EKG concerning of inferior STEMI  Assessment & Plan      1. Chest pain/ACS - EKG concerning for STEMI, sent for cath yesterday - cath with LAD 40%, LCX 55%, OM1 70%, RCA occluded. LIMA-LAD patent SVG-OM1 occluded, SVG-RPDA occluded. From cath note inferior STEMI thought due to extensive thrombus in occluded vein graft to RCA. Patient had been on ASA x 1 week - recs for medical therapy  - echo pending.  Trops up to 13, have  not peaked. Will repeat.  - medical therapy with ASA 81 mg, plavix 75mg .   - start lopresor 12.5mg  bid today, likely lisinopril 2.5mg  tomorrow.       2. Mobitz I second degree AV block - in setting of inferior MI, initial rates in 40s - from notes prior outpatient monitors have shown mobitz 2 block. Watchful waiting by EP - rates back to normal, start low dose beta blocker he had been on for his SVT at home.   - on tele overnight normal heart rates, actually nonconducted PACs as opposed to any form of block.   3. History of SVT - history of prior ablation but with recurrence.  - beta blocker on hold due to bradycardia initially  Restart lopresor 12.5mg  bid   For questions or updates, please contact [place] Please consult [email or web address] for contact info under        Signed, Carlyle Dolly, MD  03/15/2018, 10:30 AM

## 2018-03-15 NOTE — Plan of Care (Signed)
°

## 2018-03-16 ENCOUNTER — Telehealth: Payer: Self-pay | Admitting: Physician Assistant

## 2018-03-16 ENCOUNTER — Encounter (HOSPITAL_COMMUNITY): Payer: Self-pay | Admitting: Cardiovascular Disease

## 2018-03-16 LAB — BASIC METABOLIC PANEL
Anion gap: 10 (ref 5–15)
BUN: 13 mg/dL (ref 8–23)
CHLORIDE: 103 mmol/L (ref 98–111)
CO2: 24 mmol/L (ref 22–32)
Calcium: 8.8 mg/dL — ABNORMAL LOW (ref 8.9–10.3)
Creatinine, Ser: 1.27 mg/dL — ABNORMAL HIGH (ref 0.61–1.24)
GFR calc Af Amer: >60 mL/min (ref >60)
GFR calc non Af Amer: 56 mL/min — ABNORMAL LOW (ref >60)
Glucose, Bld: 115 mg/dL — ABNORMAL HIGH (ref 70–99)
Potassium: 3.9 mmol/L (ref 3.5–5.1)
Sodium: 137 mmol/L (ref 135–145)

## 2018-03-16 LAB — CBC
HCT: 36.2 % — ABNORMAL LOW (ref 39.0–52.0)
Hemoglobin: 12 g/dL — ABNORMAL LOW (ref 13.0–17.0)
MCH: 31.1 pg (ref 26.0–34.0)
MCHC: 33.1 g/dL (ref 30.0–36.0)
MCV: 93.8 fL (ref 80.0–100.0)
Platelets: 173 10*3/uL (ref 150–400)
RBC: 3.86 MIL/uL — ABNORMAL LOW (ref 4.22–5.81)
RDW: 12 % (ref 11.5–15.5)
WBC: 12.8 10*3/uL — ABNORMAL HIGH (ref 4.0–10.5)
nRBC: 0 % (ref 0.0–0.2)

## 2018-03-16 LAB — TROPONIN I: Troponin I: 18.27 ng/mL (ref <0.03)

## 2018-03-16 MED ORDER — CLOPIDOGREL BISULFATE 75 MG PO TABS: 75.0000 mg | ORAL_TABLET | Freq: Every day | ORAL | 3 refills | Status: DC

## 2018-03-16 MED ORDER — ASPIRIN 81 MG PO CHEW: 81.0000 mg | CHEWABLE_TABLET | Freq: Every day | ORAL | 3 refills | Status: DC

## 2018-03-16 MED ORDER — STUDY - AEGIS II STUDY - PLACEBO OR CSL112 (PI-HILTY)
170.0000 mL | Freq: Once | INTRAVENOUS | Status: AC
  Administered 2018-03-16: 170 mL via INTRAVENOUS
  Filled 2018-03-16: qty 170

## 2018-03-16 MED ORDER — METOPROLOL SUCCINATE ER 25 MG PO TB24: 12.5000 mg | ORAL_TABLET | Freq: Every day | ORAL | 3 refills | Status: DC

## 2018-03-16 MED ORDER — ATORVASTATIN CALCIUM 80 MG PO TABS: 80.0000 mg | ORAL_TABLET | Freq: Every day | ORAL | 3 refills | Status: DC

## 2018-03-16 MED ORDER — METOPROLOL SUCCINATE ER 25 MG PO TB24: 12.5000 mg | ORAL_TABLET | Freq: Every day | ORAL | Status: DC

## 2018-03-16 MED FILL — Nitroglycerin IV Soln 100 MCG/ML in D5W: INTRA_ARTERIAL | Qty: 10 | Status: AC

## 2018-03-16 MED FILL — Nitroprusside Sodium in NaCl 0.9% IV Soln 20 MG/100ML: INTRAVENOUS | Qty: 100 | Status: AC

## 2018-03-16 NOTE — Progress Notes (Addendum)
CARDIAC REHAB PHASE I   PRE:  Rate/Rhythm: 58 2nd degree type 1 block  BP:  Supine:   Sitting: 112/66  Standing:    SaO2:   MODE:  Ambulation: 680 ft   POST:  Rate/Rhythm: 104 ST with skipped beats transitioning to 2nd degree type 2 AV block with rest with HR into 40s-50s  BP:  Supine:   Sitting: 122/86  Standing:    SaO2:   Educated pt and wife about heart attack, exercise post heart attack, heart healthy diet, and consulting with cardiologist for nitroglycerin usage d/t lower HR and rhythm. Pt and wife also educated on anticoagulation of ASA and Plavix. CRPII referral discussed and referral placed to [facility]. Pt and pt wife verbalized understanding.   [phone]  [place], BSN 03/16/2018 11:16 AM

## 2018-03-16 NOTE — Discharge Instructions (Signed)
PLEASE REMEMBER TO BRING ALL OF YOUR MEDICATIONS TO EACH OF YOUR FOLLOW-UP OFFICE VISITS.  PLEASE ATTEND ALL SCHEDULED FOLLOW-UP APPOINTMENTS.   Activity: Increase activity slowly as tolerated. You may shower, but no soaking baths (or swimming) for 1 week. No driving for 24 hours. No lifting over 5 lbs for 1 week. No sexual activity for 1 week.   You May Return to Work: in 1 week (if applicable)  Wound Care: You may wash cath site gently with soap and water. Keep cath site clean and dry. If you notice pain, swelling, bleeding or pus at your cath site, please call [phone].     Coronary Artery Disease, Male  Coronary artery disease (CAD) is a condition in which the arteries that lead to the heart (coronary arteries) become narrow or blocked. The narrowing or blockage can lead to decreased blood flow to the heart. Prolonged reduced blood flow can cause a heart attack (myocardial infarction or MI). This condition may also be called coronary heart disease. Because CAD is the leading cause of death in men, it is important to understand what causes this condition and how it is treated. What are the causes? CAD is most often caused by atherosclerosis. This is the buildup of fat and cholesterol (plaque) on the inside of the arteries. Over time, the plaque may narrow or block the artery, reducing blood flow to the heart. Plaque can also become weak and break off within a coronary artery and cause a sudden blockage. Other less common causes of CAD include:  An embolism or blood clot in a coronary artery.  A tearing of the artery (spontaneous coronary artery dissection).  An aneurysm.  Inflammation (vasculitis) in the artery wall. What increases the risk? The following factors may make you more likely to develop this condition:  Age. Men over age 3 are at a greater risk of CAD.  Family history of CAD.  Gender. Men often develop CAD earlier in life than women.  High blood pressure  (hypertension).  Diabetes.  High cholesterol levels.  Tobacco use.  Excessive alcohol use.  Lack of exercise.  A diet high in saturated and trans fats, such as fried food and processed meat. Other possible risk factors include:  High stress levels.  Depression.  Obesity.  Sleep apnea. What are the signs or symptoms? Many people do not have any symptoms during the early stages of CAD. As the condition progresses, symptoms may include:  Chest pain (angina). The pain can: ? Feel like a crushing or squeezing, or a tightness, pressure, fullness, or heaviness in the chest. ? Last more than a few minutes or can stop and recur. The pain tends to get worse with exercise or stress and to fade with rest.  Pain in the arms, neck, jaw, or back.  Unexplained heartburn or indigestion.  Shortness of breath.  Nausea or vomiting.  Sudden light-headedness.  Sudden cold sweats.  Fluttering or fast heartbeat (palpitations). How is this diagnosed? This condition is diagnosed based on:  Your family and medical history.  A physical exam.  Tests, including: ? A test to check the electrical signals in your heart (electrocardiogram). ? Exercise stress test. This looks for signs of blockage when the heart is stressed with exercise, such as running on a treadmill. ? Pharmacologic stress test. This test looks for signs of blockage when the heart is being stressed with a medicine. ? Blood tests. ? Coronary angiogram. This is a procedure to look at the coronary arteries to  see if there is any blockage. During this test, a dye is injected into your arteries so they appear on an X-ray. ? A test that uses sound waves to take a picture of your heart (echocardiogram). ? Chest X-ray. How is this treated? This condition may be treated by:  Healthy lifestyle changes to reduce risk factors.  Medicines such as: ? Antiplatelet medicines and blood-thinning medicines, such as aspirin. These help to  prevent blood clots. ? Nitroglycerin. ? Blood pressure medicines. ? Cholesterol-lowering medicine.  Coronary angioplasty and stenting. During this procedure, a thin, flexible tube is inserted through a blood vessel and into a blocked artery. A balloon or similar device on the end of the tube is inflated to open up the artery. In some cases, a small, mesh tube (stent) is inserted into the artery to keep it open.  Coronary artery bypass surgery. During this surgery, veins or arteries from other parts of the body are used to create a bypass around the blockage and allow blood to reach your heart. Follow these instructions at home: Medicines  Take over-the-counter and prescription medicines only as told by your health care provider.  Do not take the following medicines unless your health care provider approves: ? NSAIDs, such as ibuprofen, naproxen, or celecoxib. ? Vitamin supplements that contain vitamin A, vitamin E, or both. Lifestyle  Follow an exercise program approved by your health care provider. Aim for 150 minutes of moderate exercise or 75 minutes of vigorous exercise each week.  Maintain a healthy weight or lose weight as approved by your health care provider.  Rest when you are tired.  Learn to manage stress or try to limit your stress. Ask your health care provider for suggestions if you need help.  Get screened for depression and seek treatment, if needed.  Do not use any products that contain nicotine or tobacco, such as cigarettes and e-cigarettes. If you need help quitting, ask your health care provider.  Do not use illegal drugs. Eating and drinking  Follow a heart-healthy diet. A dietitian can help educate you about healthy food options and changes. In general, eat plenty of fruits and vegetables, lean meats, and whole grains.  Avoid foods high in: ? Sugar. ? Salt (sodium). ? Saturated fat, such as processed or fatty meat. ? Trans fat, such as fried foods.  Use  healthy cooking methods such as roasting, grilling, broiling, baking, poaching, steaming, or stir-frying.  If you drink alcohol, and your health care provider approves, limit your alcohol intake to no more than 2 drinks per day. One drink equals 12 ounces of beer, 5 ounces of wine, or 1 ounces of hard liquor. General instructions  Manage any other health conditions, such as hypertension and diabetes. These conditions affect your heart.  Your health care provider may ask you to monitor your blood pressure. Ideally, your blood pressure should be below 130/80.  Keep all follow-up visits as told by your health care provider. This is important. Get help right away if:  You have pain in your chest, neck, arm, jaw, stomach, or back that: ? Lasts more than a few minutes. ? Is recurring. ? Is not relieved by taking medicine under your tongue (sublingualnitroglycerin).  You have too much (profuse) sweating without cause.  You have unexplained: ? Heartburn or indigestion. ? Shortness of breath or difficulty breathing. ? Fluttering or fast heartbeat (palpitations). ? Nausea or vomiting. ? Fatigue. ? Feelings of nervousness or anxiety. ? Weakness. ? Diarrhea.  You have  sudden light-headedness or dizziness.  You faint.  You feel like hurting yourself or think about taking your own life. These symptoms may represent a serious problem that is an emergency. Do not wait to see if the symptoms will go away. Get medical help right away. Call your local emergency services (911 in the [place] Do not drive yourself to the hospital. Summary  Coronary artery disease (CAD) is a process in which the arteries that lead to the heart (coronary arteries) become narrow or blocked. The narrowing or blockage can lead to a heart attack.  Many people do not have any symptoms during the early stages of CAD. This is called "silent CAD."  CAD can be treated with lifestyle changes, medicines, surgery, or a  combination of these treatments. This information is not intended to replace advice given to you by your health care provider. Make sure you discuss any questions you have with your health care provider. Document Released: 08/18/2013 Document Revised: 01/12/2016 Document Reviewed: 01/12/2016 Elsevier Interactive Patient Education  2019 [employer].

## 2018-03-16 NOTE — Research (Addendum)
Patient seen and examined (2:10 PM).   AEGIS II trial reviewed with patient in detail.  He consents to the study.  Currently asymptomatic and ready for discharge.    VSS as recorded.  Lungs fields clear.  No JVD. Cor occasionally irregular with ectopy (chronic), but without significant murmur.  Abdomen soft.  Extremities reveal no edema.   [place] Class [employer].   Loretha Brasil. Lia Foyer, MD, [facility], [place] Director, [facility]/[place]

## 2018-03-16 NOTE — Progress Notes (Signed)
Progress Note  Patient Name: Steven Mathews Date of Encounter: 03/16/2018  Primary Cardiologist: Minus Breeding, MD   Subjective   Doing well, anxious to go home. Has a lot of questions about activity, etc. He is building a guest house at home by himself, wants to know what type of activities are safe. Discussed medications, recommendations at length. Denies chest pain, shortness of breath at rest or with normal exertion. No chest pain, shortness of breath, PND, orthopnea, LE edema. No syncope or palpitations.  Inpatient Medications    Scheduled Meds: . aspirin  81 mg Oral Daily  . atorvastatin  80 mg Oral q1800  . clopidogrel  75 mg Oral Q breakfast  . heparin  5,000 Units Subcutaneous Q8H  . Influenza vac split quadrivalent PF  0.5 mL Intramuscular Tomorrow-1000  . metoprolol tartrate  12.5 mg Oral BID  . pneumococcal 23 valent vaccine  0.5 mL Intramuscular Tomorrow-1000  . sodium chloride flush  3 mL Intravenous Q12H   Continuous Infusions: . sodium chloride    . sodium chloride Stopped (03/14/18 1330)  . sodium chloride     PRN Meds: sodium chloride, acetaminophen, diazepam, diphenhydrAMINE, hydrocortisone cream, ondansetron (ZOFRAN) IV, sodium chloride flush   Vital Signs    Vitals:   03/15/18 1928 03/15/18 2359 03/16/18 0441 03/16/18 0804  BP: 116/67 115/70 109/74 105/63  Pulse: 86  95 76  Resp: 16   17  Temp: 98.2 F (36.8 C) 99.9 F (37.7 C) 98.4 F (36.9 C) 98.8 F (37.1 C)  TempSrc: Oral Oral Oral Oral  SpO2: 100% 98% 95%   Weight:      Height:        Intake/Output Summary (Last 24 hours) at 03/16/2018 0851 Last data filed at 03/15/2018 1800 Gross per 24 hour  Intake 620.94 ml  Output 0 ml  Net 620.94 ml   Last 3 Weights 03/13/2018 03/13/2018 10/08/2017  Weight (lbs) 192 lb 10.9 oz 190 lb 190 lb 12.8 oz  Weight (kg) 87.4 kg 86.183 kg 86.546 kg      Telemetry    Second degree AV block, type I - Personally Reviewed  ECG    Second degree AV block,  type I, RBBB - Personally Reviewed  Physical Exam   GEN: No acute distress.   Neck: No JVD Cardiac: RRR with occasional dropped beats, no murmurs, rubs, or gallops.  Respiratory: Clear to auscultation bilaterally. GI: Soft, nontender, non-distended  MS: No edema; No deformity. Neuro:  Nonfocal  Psych: Normal affect   Labs    Chemistry Recent Labs  Lab 03/13/18 2055 03/14/18 0429 03/16/18 0237  NA 137 139 137  K 4.8 4.8 3.9  CL 104 108 103  CO2 24 23 24   GLUCOSE 116* 132* 115*  BUN 19 17 13   CREATININE 1.16 1.14 1.27*  CALCIUM 9.0 8.5* 8.8*  PROT 6.3*  --   --   ALBUMIN 3.6  --   --   AST 47*  --   --   ALT 53*  --   --   ALKPHOS 48  --   --   BILITOT 0.4  --   --   GFRNONAA >60 >60 56*  GFRAA >60 >60 >60  ANIONGAP 9 8 10      Hematology Recent Labs  Lab 03/13/18 2055 03/14/18 0429 03/16/18 0237  WBC 11.8* 11.0* 12.8*  RBC 4.13* 3.94* 3.86*  HGB 12.7* 12.4* 12.0*  HCT 38.1* 36.6* 36.2*  MCV 92.3 92.9 93.8  MCH 30.8  31.5 31.1  MCHC 33.3 33.9 33.1  RDW 12.0 12.3 12.0  PLT 220 213 173    Cardiac Enzymes Recent Labs  Lab 03/14/18 1032 03/14/18 1606 03/15/18 1040 03/16/18 0237  TROPONINI 8.62* 13.03* 21.18* 18.27*   No results for input(s): TROPIPOC in the last 168 hours.   BNPNo results for input(s): BNP, PROBNP in the last 168 hours.   DDimer No results for input(s): DDIMER in the last 168 hours.   Radiology    No results found.  Cardiac Studies   Cath 03/13/18  Prox LAD lesion is 30% stenosed.  Prox Cx lesion is 55% stenosed.  Origin to Prox Graft lesion is 100% stenosed.  Prox RCA lesion is 80% stenosed.  Prox RCA to Mid RCA lesion is 95% stenosed.  Mid RCA to Dist RCA lesion is 100% stenosed.  Prox Graft to Dist Graft lesion is 100% stenosed.  LV end diastolic pressure is mildly elevated.  There is mild left ventricular systolic dysfunction.  Ost 1st Mrg lesion is 70% stenosed.   Multivessel native coronary obstructive  disease with 40 and 30% proximal LAD stenoses with competitive filling of the mid LAD via LIMA graft; small patent ramus intermediate vessel; 50 to 60% eccentric proximal left circumflex stenosis with smooth 70% ostial circumflex marginal stenosis with evidence for a very distal segment of the graft anastomosing into the mid OM vessel without competitive filling suggesting graft occlusion; 80% and 95% proximal to mid native RCA stenoses with diffuse 60% mid stenosis followed by total occlusion.  There is evidence for left to right collateralization to the PDA and distal vessels via the left coronary injection.  Patent LIMA graft supplying the mid LAD.  Probable old occlusion of the vein graft which had supplied the obtuse marginal vessel.  Probable acutely occluded vein graft supplying the distal RCA with extensive thrombus throughout and TIMI 0 flow beyond the proximal third of the graft.  Mild LV dysfunction with an ejection fraction of 40 to 45% and evidence for focal basal inferior hypocontractility and focal apical to apical inferior hypocontractility.  LVEDP 19 mmHg.  RECOMMENDATION: Medical therapy.  The patient's mild ST elevation inferiorly most likely is due to extensive thrombus in the occluded vein graft supplying the distal RCA.  The patient had been off aspirin for over a week.  Will initiate Plavix 300 mg which was given in the lab and 75 mg daily for dual antiplatelet therapy.  Recommend aggressive medical therapy for concomitant CAD.  Aggressive lipid-lowering therapy with target LDL less than 70.  Will obtain 2D echo Doppler study.  We will try to to obtain more specific records of his CABG revascularization.  Echo 03/15/18  1. The left ventricle has normal systolic function of 29-51%. The cavity size was normal. There is concentric left ventricular hypertrophy. Echo evidence of normal diastolic relaxation.  2. The right ventricle has moderately reduced systolic function. The  cavity was mildly enlarged. There is no increase in right ventricular wall thickness.  3. The mitral valve is normal in structure. There is mild thickening. There is mild mitral annular calcification present.  4. The tricuspid valve is normal in structure.  5. The aortic valve is tricuspid There is mild thickening and sclerosis without any evidence of stenosis of the aortic valve.  6. There is mild dilatation of the aortic root.  7. The inferior vena cava was dilated in size with >50% respiratory variability.  8. No evidence of left ventricular regional wall motion abnormalities.  Patient Profile     73 y.o. male with PMH CAD s/p CABG, HTN, HLD, ST s/p ablation who presented with chest pain, ECG concerning for inferior STEMI.  Assessment & Plan    Acute coronary syndrome, with chest pain and ECG concerning for inferior STEMI -s/p cath 2/7 that suggested acute thrombus in the vein graft supplying the distal RCA -may have been partially due to the fact that the patient had been off of aspirin for 1 week prior to event -medical therapy with aspirin 81 mg, loaded with clopidogrel and to continue on 75 mg clopidogrel daily -continue atorvastatin, was on 20 mg at home, increased to 80 mg this admission. LDL goal <70, was 114 on lipid panel on admission. -on metoprolol tartrate 12.5 mg BID (home dose) -normal LV function by echo, moderately reduced RV function -BP borderline (105/63 today), unlikely to tolerate ACEi or MRA currently. -consulted cardiac rehab, he is not sure that he would participate in a program but he would like recommendations for exercise. 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 Bradycardia initially likely exacerbated by inferior MI -has returned back to baseline, tolerating low dose beta blocker yesterday (was not given in the evening). Will get dose this AM, as long has his rate, rhythm, and  symptoms are stable when walking and at rest, can likely continue this dose at home.  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 -restarted beta blocker as above  TIME SPENT WITH PATIENT: >25 minutes of direct patient care. More than 50% of that time was spent on coordination of care and counseling regarding management, secondary prevention of ASCVD, activity recommendations.  Buford Dresser, MD, PhD [facility]   For questions or updates, please contact [place] Please consult [email or web address] for contact info under     Signed, Buford Dresser, MD  03/16/2018, 8:51 AM

## 2018-03-16 NOTE — Telephone Encounter (Signed)
Spoke with Steven Mathews Pt has appt with Steven Mathews 03/25/18

## 2018-03-16 NOTE — Discharge Summary (Signed)
Discharge Summary    Patient ID: Steven Mathews MRN: [id number]; DOB: [birth year]  Admit date: 03/13/2018 Discharge date: 03/16/2018  Primary Care Provider: Cathlean Sauer, MD  Primary Cardiologist: Minus Breeding, MD  Primary Electrophysiologist:  Cristopher Peru, MD   Discharge Diagnoses    Active Problems:   Acute ST elevation myocardial infarction (STEMI) [facility])   Hx of CABG   Acute ST elevation myocardial infarction (STEMI) due to occlusion of right coronary artery [facility])   Allergies No Known Allergies  Diagnostic Studies/Procedures    LEFT HEART CATH AND CORS/GRAFTS ANGIOGRAPHY 03/13/2018  Conclusion    Ost LAD to Prox LAD lesion is 40% stenosed.  Prox LAD lesion is 30% stenosed.  Prox Cx lesion is 55% stenosed.  Origin to Prox Graft lesion is 100% stenosed.  Prox RCA lesion is 80% stenosed.  Prox RCA to Mid RCA lesion is 95% stenosed.  Mid RCA to Dist RCA lesion is 100% stenosed.  Prox Graft to Dist Graft lesion is 100% stenosed.  LV end diastolic pressure is mildly elevated.  There is mild left ventricular systolic dysfunction.  Ost 1st Mrg lesion is 70% stenosed.   Multivessel native coronary obstructive disease with 40 and 30% proximal LAD stenoses with competitive filling of the mid LAD via LIMA graft; small patent ramus intermediate vessel; 50 to 60% eccentric proximal left circumflex stenosis with smooth 70% ostial circumflex marginal stenosis with evidence for a very distal segment of the graft anastomosing into the mid OM vessel without competitive filling suggesting graft occlusion; 80% and 95% proximal to mid native RCA stenoses with diffuse 60% mid stenosis followed by total occlusion.  There is evidence for left to right collateralization to the PDA and distal vessels via the left coronary injection.  Patent LIMA graft supplying the mid LAD.  Probable old occlusion of the vein graft which had supplied the obtuse marginal vessel.  Probable acutely  occluded vein graft supplying the distal RCA with extensive thrombus throughout and TIMI 0 flow beyond the proximal third of the graft.  Mild LV dysfunction with an ejection fraction of 40 to 45% and evidence for focal basal inferior hypocontractility and focal apical to apical inferior hypocontractility.  LVEDP 19 mmHg.  RECOMMENDATION: Medical therapy.  The patient's mild ST elevation inferiorly most likely is due to extensive thrombus in the occluded vein graft supplying the distal RCA.  The patient had been off aspirin for over a week.  Will initiate Plavix 300 mg which was given in the lab and 75 mg daily for dual antiplatelet therapy.  Recommend aggressive medical therapy for concomitant CAD.  Aggressive lipid-lowering therapy with target LDL less than 70.  Will obtain 2D echo Doppler study.  We will try to to obtain more specific records of his CABG revascularization.     Echocardiogram 03/15/2018 IMPRESSIONS   1. The left ventricle has normal systolic function of 38-75%. The cavity size was normal. There is concentric left ventricular hypertrophy. Echo evidence of normal diastolic relaxation.  2. The right ventricle has moderately reduced systolic function. The cavity was mildly enlarged. There is no increase in right ventricular wall thickness.  3. The mitral valve is normal in structure. There is mild thickening. There is mild mitral annular calcification present.  4. The tricuspid valve is normal in structure.  5. The aortic valve is tricuspid There is mild thickening and sclerosis without any evidence of stenosis of the aortic valve.  6. There is mild dilatation of the aortic root.  7. The  inferior vena cava was dilated in size with >50% respiratory variability.  8. No evidence of left ventricular regional wall motion abnormalities.  FINDINGS  Left Ventricle: The left ventricle has normal systolic function of 85-88%. The cavity size was normal. There is concentric left  ventricular hypertrophy. Echo evidence of normal diastolic relaxation No evidence of left ventricular regional wall motion  abnormalities.. Right Ventricle: The right ventricle has moderately reduced systolic function. The cavity was mildly enlarged. There is no increase in right ventricular wall thickness. Left Atrium: left atrial size was normal in size Right Atrium: right atrial size was normal in size Interatrial Septum: No atrial level shunt detected by color flow Doppler.  Pericardium: There is no evidence of pericardial effusion. Mitral Valve: The mitral valve is normal in structure. There is mild thickening. There is mild mitral annular calcification present. Mitral valve regurgitation is mild by color flow Doppler. Tricuspid Valve: The tricuspid valve is normal in structure. Tricuspid valve regurgitation is mild by color flow Doppler. Aortic Valve: The aortic valve is tricuspid There is mild thickening and sclerosis without any evidence of stenosis of the aortic valve. Aortic valve regurgitation was not visualized by color flow Doppler. Pulmonic Valve: The pulmonic valve was grossly normal. Pulmonic valve regurgitation is not visualized by color flow Doppler. Aorta: There is mild dilatation of the aortic root. Venous: The inferior vena cava is dilated in size with greater than 50% respiratory variability.  _____________   History of Present Illness     Steven Mathews is a 73 year old male with past medical history of CAD s/p CABG, GERD, hypertension, hyperlipidemia, history of SVT s/p ablation.  Last echocardiogram obtained on 10/17/2015 showed EF 50 to 55%, mild LVH, grade 1 DD.  Although atrial fibrillation was listed as his past medical history, however he is not on any systemic anticoagulation therapy nor is there any documented atrial fibrillation on previous cardiology note.  Patient was in his usual state of health until 4:45 PM this afternoon when he started having diffuse anterior  chest discomfort more lateral to the right side.  The chest discomfort is not similar to the previous MI, however EKG showed Wenckebach rhythm with ST elevation in the inferior leads, right bundle branch block.  ED work-up is currently pending.  Cardiology consulted for possible inferior STEMI.  According to the patient, he has not taking his aspirin for this past week.  Hospital Course     Consultants: None  Acute coronary syndrome, with chest pain and ECG concerning for inferior STEMI -s/p cath 2/7 that suggested acute thrombus in the vein graft supplying the distal RCA -may have been partially due to the fact that the patient had been off of aspirin for 1 week prior to event -medical therapy with aspirin 81 mg, loaded with clopidogrel and to continue on 75 mg clopidogrel daily -continue atorvastatin, was on 20 mg at home, increased to 80 mg this admission. LDL goal <70, was 114 on lipid panel on admission. -on metoprolol tartrate 12.5 mg BID (home dose) -normal LV function by echo, moderately reduced RV function -BP borderline (105/63 today), unlikely to tolerate ACEi or MRA currently. -consulted cardiac rehab, he is not sure that he would participate in a program but he would like recommendations for exercise. 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 Bradycardia initially likely exacerbated by inferior MI -has returned back to baseline, tolerating low dose beta blocker yesterday (was  not given in the evening). He got a dose this AM and his rate, rhythm, and symptoms are stable when walking and at rest.  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 -restarted beta blocker as above  Patient has been seen by Dr. Harrell Gave today and deemed ready for discharge home. All follow up appointments have been scheduled. Discharge medications are listed below. _____________  Discharge Vitals Blood pressure  (!) 96/59, pulse (!) 50, temperature 98.8 F (37.1 C), temperature source Oral, resp. rate 17, height 6' (1.829 m), weight 87.4 kg, SpO2 94 %.  Filed Weights   03/13/18 1920 03/13/18 2130  Weight: 86.2 kg 87.4 kg    Labs & Radiologic Studies    CBC Recent Labs    03/13/18 2055 03/14/18 0429 03/16/18 0237  WBC 11.8* 11.0* 12.8*  NEUTROABS 9.6*  --   --   HGB 12.7* 12.4* 12.0*  HCT 38.1* 36.6* 36.2*  MCV 92.3 92.9 93.8  PLT 220 213 130   Basic Metabolic Panel Recent Labs    03/14/18 0429 03/16/18 0237  NA 139 137  K 4.8 3.9  CL 108 103  CO2 23 24  GLUCOSE 132* 115*  BUN 17 13  CREATININE 1.14 1.27*  CALCIUM 8.5* 8.8*   Liver Function Tests Recent Labs    03/13/18 2055  AST 47*  ALT 53*  ALKPHOS 48  BILITOT 0.4  PROT 6.3*  ALBUMIN 3.6   No results for input(s): LIPASE, AMYLASE in the last 72 hours. Cardiac Enzymes Recent Labs    03/14/18 1606 03/15/18 1040 03/16/18 0237  TROPONINI 13.03* 21.18* 18.27*   BNP Invalid input(s): POCBNP D-Dimer No results for input(s): DDIMER in the last 72 hours. Hemoglobin A1C No results for input(s): HGBA1C in the last 72 hours. Fasting Lipid Panel Recent Labs    03/13/18 2055  CHOL 168  HDL 40*  LDLCALC 114*  TRIG 71  CHOLHDL 4.2   Thyroid Function Tests No results for input(s): TSH, T4TOTAL, T3FREE, THYROIDAB in the last 72 hours.  Invalid input(s): FREET3 _____________  Dg Chest Port 1 View  Result Date: 03/13/2018 CLINICAL DATA:  73 y/o  M; sudden onset chest pain and nausea. EXAM: PORTABLE CHEST 1 VIEW COMPARISON:  06/24/2008 chest radiograph FINDINGS: Stable cardiac silhouette within normal limits given projection and technique. Post CABG with sternotomy wires in alignment. Aortic atherosclerosis with calcification. Clear lungs. No pleural effusion or pneumothorax. No acute osseous abnormality is evident. IMPRESSION: No acute pulmonary process identified. Electronically Signed   By: Kristine Garbe M.D.   On: 03/13/2018 19:51   Disposition   Pt is being discharged home today in good condition.  Follow-up Plans & Appointments    Follow-up Information    [facility], [place] Follow up.   Specialties:  Cardiology, Radiology Why:  Hospital cardiology follow up on Wed. 03/25/2018 at 10:00. Please arrive 15 minutes early for check in.  Contact information: [address] [place] [address] [phone]        Minus Breeding, MD Follow up.   Specialty:  Cardiology Why:  3 month hospital follow up on Wed. 06/17/2018 at 9:40 am. Please arrive 15 minutes early for check in.  Contact information: [place] [id number] [phone]          Discharge Instructions    AMB referral to cardiac rehabilitation   Complete by:  As directed    Diagnosis:  STEMI   Diet - low sodium heart  healthy   Complete by:  As directed    Increase activity slowly   Complete by:  As directed       Discharge Medications   Allergies as of 03/16/2018   No Known Allergies     Medication List    STOP taking these medications   aspirin EC 81 MG tablet Replaced by:  aspirin 81 MG chewable tablet   metoprolol tartrate 25 MG tablet Commonly known as:  LOPRESSOR     TAKE these medications   aspirin 81 MG chewable tablet Chew 1 tablet (81 mg total) by mouth daily. Start taking on:  March 17, 2018 Replaces:  aspirin EC 81 MG tablet   atorvastatin 80 MG tablet Commonly known as:  LIPITOR Take 1 tablet (80 mg total) by mouth daily at 6 PM. What changed:    medication strength  See the new instructions.   clopidogrel 75 MG tablet Commonly known as:  PLAVIX Take 1 tablet (75 mg total) by mouth daily with breakfast. Start taking on:  March 17, 2018   diphenhydrAMINE 25 MG tablet Commonly known as:  BENADRYL Take 25 mg by mouth every 8 (eight) hours as needed for allergies.   metoprolol succinate 25 MG 24 hr  tablet Commonly known as:  TOPROL-XL Take 0.5 tablets (12.5 mg total) by mouth daily. Start taking on:  March 17, 2018        Acute coronary syndrome (MI, NSTEMI, STEMI, etc) this admission?: Yes.     AHA/ACC Clinical Performance & Quality Measures: 1. Aspirin prescribed? - Yes 2. ADP Receptor Inhibitor (Plavix/Clopidogrel, Brilinta/Ticagrelor or Effient/Prasugrel) prescribed (includes medically managed patients)? - Yes 3. Beta Blocker prescribed? - Yes 4. High Intensity Statin (Lipitor 40-80mg  or Crestor 20-40mg ) prescribed? - Yes 5. EF assessed during THIS hospitalization? - Yes 6. For EF <40%, was ACEI/ARB prescribed? - Not Applicable (EF >/= 50%) 7. For EF <40%, Aldosterone Antagonist (Spironolactone or Eplerenone) prescribed? - Not Applicable (EF >/= 93%) 8. Cardiac Rehab Phase II ordered (Included Medically managed Patients)? - Yes     Outstanding Labs/Studies   FLP and LFTs in 6 weeks  Duration of Discharge Encounter   Greater than 30 minutes including physician time.  Signed, Daune Perch, NP 03/16/2018, 1:49 PM

## 2018-03-16 NOTE — Research (Signed)
AEGIS Screening:    Inclusion/Exclusion Checklist:   Inclusions:   Y N   _0  _1  Male or male at least 74 years of age  _2  _3  Evidence of type I (spontaneous) MI (STEMI or NSTEMI) caused by atherothrombotic artery disease as defined by the following:  _4  _5  a. Detection of a rise and/or fall in Troponin I or T with at least 1 value about the 99% upper reference limit.     (AND)---  Any 1 or more of the following:   _6  _7  - symptoms of ischemia (ie, resulting from a primary coronary    artery event)  _8  _9  - New or presumably new significant ST/T wave changes or left bundle branch block.  _10  _11       - Development of pathological Q waves on EKG  _12  _13  - Imaging evidence of new loss or viable myocardium or regional wall motion abnormality.  _14  _15  - ID of intracoronary thrombus by angiography.  _16  _17  No suspicion of acute kidney injury at least 12 hours after angiography OR after first medical contract for subject's not undergoing angiography There must be documented evidence of stable renal function defined as no more than an increase in Serum Creatinine < 0.54m/dl from pre-contrast serum creatinine value.  (Before 1.00__   12 hrs after __1.27__)    Evidence of multi-vessel coronary artery disease defined as:  _18  _19  A. At least 50% stenosis of theleft main coronary artery or at least 2 epicardial coronary artery territories (LAD, LCx, RCA) on catherization performed during the index hospitalization.   _20  _21  B. Prior cardiac catherization documenting @ least 50% stenosis of the LM or at least 2 epicardial >1 epicardial artery territories (LAD, LCx, RCA)  _22  _23  C. Prior PCI and evidence of 50% stenosis of at least 1 epicardial coronary artery territory different from prior revascularized artery territory.   _24  _25  D. Prior multivessel coronary artery bypass grafting.  _26  _27  Plus either Established risk factors:  _28  _29  o On pharmacological treatment for diabetes mellitus                 OR     TWO of the following  _30  _31  o Prior history of MI  _32  _33  o Age ? 65 years  _34  _35  o Peripheral arterial disease defined as meeting at least 1 of the following criteria:  _36  _37          +   Current intermittent claudication or resting limb ischemia and ABI    ?0.90  _38  _39          +   History of peripheral revascularization (surgical or percutaneous)  _40  _41          +  History of limb amputation due to PAD  _42  _43          +  Angiographic evidence (using computed tomographic angiography, MRA, or invasive angiography or a peripheral artery stenosis ?50%.  _44  _45  If the male subject without child bearing potential, not breastfeeding, not pregnant, and if of child bearing potential agree to contraception or lifestyle methods to avoid pregnancy? Child-bearing potential (must select all)   ____ not pregnant (by urine or serum hCG AND   ____ willing to use an acceptable method of contraception to avoid pregnancy during the study and for 3 months after last dose of investional product (refer to acceptable methods per protocol)   ____ if breastfeeding, willing to cease breastfeeding Date of pregnancy test: (____ /_____/  ________)  Result  - or + Not of Child bearing potential (select one)   ____ Age >= 81   ____ Age 36-60 with amenorrhea for at least 1 year with documented evidence of follicle-stimulating hormone level >40 IU/L   ____ Surgically sterile for at least 3 months prior to randomization  _0  _1  Investigator believes that the subject is willing and able to adhere to all protocol requirements.   _2  _3  Willing to not participate in another investigational study until completion of their final study visit.     Exclusions:  Y N   _4  _5  If these are the reason for MI (pt is excluded)  _6  _7  1. Myocardial necrosis due mismatch between myocardial oxygen demand and supply, usually due to fixed coronary disease with increased demand leading to MI  _8  _9  2. Cardiac death due to MI  _10  _11   3. Myocardial necrosis due to complications from a PCI  _12  _13  4. Myocardial necrosis due to stent thrombosis  _14  _15  5. Myocardial necrosis due to in stent restenosis as the only etiology  _16  _17  6. Myocardial necrosis in the stenting of coronary artery bypass grafting  _18  _19  Ongoing hemodynamic instability  _20  _21       +  History of NYHA Class III or IV heart failure within the last year  _22  _23       +  Killip Class III or IV heart failure  _24  _25       +  Sustained and/or symptomatic hypotension (SBP <90 mm HG)  _26  _27       +  Known left ventricular ejection fraction of <30%  _28  _29  Evidence of hepatobiliary disease as indicated by any 1 or more of the   following at screening:  _30  _31       +  Current active hepatic dysfunction or active biliary obstruction  _32  _33       +       +  Chronic or prior history of cirrhosis or of infectious / inflammatory hepatitis NOTE: If a patient has a medical history of recovered Hep A, B, or C without evidence of cirrhosis, he/she could be considered for inclusion if there is documented evidence that there is no active infection (ie, antigen negative)  _34  _35       + Hepatic lab abnormalities: ALT > 3 x ULN or Total bilirubin > 2x ULN at randomization.  _36  _37  Severe chronic kidney disease (eGFR of <61m) or on dialysis  _38  _39  Plan to undergo scheduled coronary artery bypass graft surgery after randomization, as determined at the time of screening  _40  _41  Known history of allergies to soybeans, peanuts, albumin  _42  _43  Body weight <50 kg  _44  _45  A known history of IgA deficiency or antibodies to IgA  _46  _47  A comorbid condition with an estimated life expectancy of ? 6 months at time of consent  _48  _49  Women who are pregnant or breastfeeding at time of randomization  _50  _51  Participated in another interventional clinical study at the time of consent  _52  _53  Treatment with anticancer therapy  _54  _55  Previously randomized or participated in this study or  previously exposed to CSL112     EQ-5D-5L  MOBILITY:    I HAVE NO PROBLEMS WALKING _56   I HAVE SLIGHT PROBLEMS WALKING _57   I HAVE MODERATE PROBLEMS WALKING _58   I HAVE SEVERE PROBLEMS WALKING _59   I AM UNABLE TO WALK  _60     SELF-CARE:   I HAVE  NO PROBLEMS WASING OR DRESSING MYSELF  _0   I HAVE SLIGHT PROBLEMS WASHING OR DRESSING MYSELF  _1   I HAVE MODERATE PROBLEMS WASHING OR DRESSING MYSELF _2   I HAVE SEVERE PROBLEMS WASHING OR DRESSING MYSELF  _3   I HAVE SEVERE PROBLEMS WASHING OR DRESSING MYSELF  _4   I AM UNABLE TO [place] OR DRESS MYSELF _5     USUAL ACTIVITIES: (E.G. WORK/STUDY/HOUSEWORK/FAMILY OR LEISURE ACTIVITIES.    I HAVE NO PROBLEMS DOING MY USUAL ACTIVITIES _6   I HAVE SLIGHT PROBLEMS DOING MY USUAL ACTIVITIES _7   I HAVE MODERATE PROBLEMS DOING MY USUAL ACTIVIITIES _8   I HAVE SEVERE PROBLEMS DOING MY USUAL ACTIVITIES _9   I AM UNABLE TO DO MY USUAL ACTIVITIES _10     PAIN /DISCOMFORT   I HAVE NO PAIN OR DISCOMFORT _11   I HAVE SLIGHT PAIN OR DISCOMFORT _12   I HAVE MODERATE PAIN OR DISCOMFORT _13   I HAVE SEVERE PAIN OR DISCOMFORT _14   I HAVE EXTREME PAIN OR DISCOMFORT _15     ANXIETY/DEPRESSION   I AM NOT ANXIOUS OR DEPRESSED _16   I AM SLIGHTLY ANXIOUS OR DEPRESSED _17   I AM MODERATELY ANXIOUS OR DREPRESSED _18   I AM SEVERELY ANXIOUS OR DEPRESSED _19   I AM EXTREMELY ANXIOUS OR DEPRESSED _20     SCALE OF 0-100 HOW WOULD YOU RATE TODAY?  0 IS THE WORSE AND 100 IS THE BEST HEALTH YOU CAN IMAGINE: 56   DEMOGRAPHICS:  Patient Name: Steven Mathews Birth Date: 1945/09/19  Sex: Male  Race: WHITE  Child Bearing: ? Yes    ? No ? Tubial ligation ? Hysterectomy  ? postmenopausal   Height: 182 cm Weight: 87 kg   Index Procedure:  Onset date of symptoms: 7-Feb-20 Onset of symptoms: 18:00  Date of First contact at hospital: 7-Feb-20 Time of first contact at hospital: 19:17  Admission Date: 7-Feb-20   Discharge Date: 10-Feb-20 Discharge Time: 18:27   Vital Signs: Date 03/13/2018    Time:  19:18 BP: 156/90   Pulse: 45   Concomitant medications: Every visit: ? See med sheet  BMP Pre Contrast IV 03/13/18 @ 2017 1.00  CMP Post Contrast IV 12 hours later: 03/16/18 @ 0237 1.27 Hepatic Panel: 03/13/18 @ 2055 ALT: 47 Total Bili: 0.4 Direct Bili: ND   Medical History:  ? CAD ? Prior MI ? PAD  ? History of Heart Failure ? Moderate to severe valvular dx ? AFib  ? Prior Coronary Revascularization  if YES please select Yes or No below:  CABG ? Yes   ? No           PCI with stent ? Yes   ? No          PCI without stent ? Yes ? No  ? CVA if checked please select one of the following Choose an item.  ? Hypertension  ? Gilberts syndrome ? CKD   ? Hypocholesteremia ? DM ? Smoker        ? eCigarette  Killip Class Stage 1     EQ-5D-3L ?  50  Future Biomedical Research: Consented ? Yes     ? No If no please date they withdrew consent from biomedical research Click or tap to enter a date.  [facility] Before Start of Infusion: ? Biochemistry panel      ? Hematology     ? Immunogenicity   (30 mins before infusion)   ? Parvovirus  ? FBR sample ? PK/PD sample [facility] End of Infusion:   ?  PK/PD Central Blood Draw Time: Before SOI: 03/16/18 @ 1525_ After EOI: _02/10/20 @ 1725______ Infusion Start Time: 03/16/2018 3:28 PM  Infusion End Time: 03/16/2018 5:22 PM VS prior to infusion: 03/16/18  96/59  [email or web address]

## 2018-03-16 NOTE — Progress Notes (Signed)
Patient discharged home with family. Home medications and discharge instructions reviewed, all questions addressed. Patient informed of follow up appointments. IVs removed and belongings returned to patient.

## 2018-03-16 NOTE — Progress Notes (Signed)
Patient bradycardic, HR in 40s with frequent non-conducted P waves. Called MD regarding 2nd degree HB types 1 & 2. Patient stable, told to watch for any symptoms.

## 2018-03-16 NOTE — Care Management Important Message (Signed)
Important Message  Patient Details  Name: Steven Mathews MRN: [id number] Date of Birth: May 02, 1945   Medicare Important Message Given:  Yes    [name] 03/16/2018, 2:52 PM

## 2018-03-17 NOTE — Telephone Encounter (Signed)
Tried to call patient, unable to leave voicemail secondary to mailbox being full.

## 2018-03-18 NOTE — Telephone Encounter (Signed)
Unable to LMVM BOX IS FULL

## 2018-03-19 ENCOUNTER — Telehealth (HOSPITAL_COMMUNITY): Payer: Self-pay

## 2018-03-19 NOTE — Telephone Encounter (Signed)
Attempted to contact pt x 3. Left message to call back 

## 2018-03-19 NOTE — Telephone Encounter (Signed)
Pt insurance is active and benefits verified through Medicare A/B. Co-pay $0.00, DED $198.00/$0.00 met, out of pocket $0.00/$0.00 met, co-insurance 20%. No pre-authorization required. Passport, 03/19/2018 @ 11:57AM, REF# [id number]  2ndary insurance is active and benefits verified through [employer]. Co-pay $0.00, DED $0.00/$0.00 met, out of pocket $0.00/$0.00 met, co-insurance 0%. No pre-authorization required.  Will contact patient to see if he is interested in the Cardiac Rehab Program. If interested, patient will need to complete follow up appt. Once completed, patient will be contacted for scheduling upon review by the RN Navigator.

## 2018-03-20 ENCOUNTER — Other Ambulatory Visit (HOSPITAL_COMMUNITY): Payer: Self-pay | Admitting: *Deleted

## 2018-03-20 NOTE — Research (Signed)
Visit 2  Pt doing well, no complaints of cp or sob.                                    "CONSENT"   YES     NO   Continuing further Investigational Product and study visits for follow-up? [x]  []   Continuing consent from future biomedical research [x]  []                                    "EVENTS"    YES     NO  AE   (IF YES SEE SOURCE) []  [x]   SAE  (IF YES SEE SOURCE) []  [x]   ENDPOINT   (IF YES SEE SOURCE) []  [x]   REVASCULARIZATION  (IF YES SEE SOURCE) []  [x]   AMPUTATION   (IF YES SEE SOURCE) []  [x]   TROPONIN'S  (IF YES SEE SOURCE) []  [x] 

## 2018-03-23 ENCOUNTER — Telehealth: Payer: Self-pay | Admitting: Internal Medicine

## 2018-03-23 ENCOUNTER — Ambulatory Visit (HOSPITAL_COMMUNITY)
Admission: RE | Admit: 2018-03-23 | Discharge: 2018-03-23 | Disposition: A | Discharge status: Home / Self Care | Payer: Medicare Other | Source: Ambulatory Visit | Attending: Internal Medicine | Admitting: Internal Medicine

## 2018-03-23 ENCOUNTER — Encounter: Payer: Medicare Other | Admitting: *Deleted

## 2018-03-23 VITALS — BP 122/65 | HR 65

## 2018-03-23 DIAGNOSIS — Z006 Encounter for examination for normal comparison and control in clinical research program: Secondary | ICD-10-CM | POA: Insufficient documentation

## 2018-03-23 MED ORDER — STUDY - AEGIS II STUDY - PLACEBO OR CSL112 (PI-HILTY)
170.0000 mL | INTRAVENOUS | Status: DC
  Administered 2018-03-23: 170 mL via INTRAVENOUS
  Filled 2018-03-23: qty 170

## 2018-03-23 NOTE — Telephone Encounter (Signed)
Spoke with patient and he is aware of appointment Wednesday and can wait until then. He will get d/c summary or letter at that appointment

## 2018-03-23 NOTE — Telephone Encounter (Signed)
New message   Per patient for insurance purposes need a statement that states that the patient had a heart attack.

## 2018-03-23 NOTE — Research (Signed)
[facility] Informed Consent   Subject Name: Steven Mathews  Subject met inclusion and exclusion criteria.  The informed consent form, study requirements and expectations were reviewed with the subject and questions and concerns were addressed prior to the signing of the consent form.  The subject verbalized understanding of the trail requirements.  The subject agreed to participate in the [facility] trial and signed the informed consent.  The informed consent was obtained prior to performance of any protocol-specific procedures for the subject.  A copy of the signed informed consent was given to the subject and a copy was placed in the subject's medical record.  Pardeep Pautz 03/16/2018, 1405

## 2018-03-23 NOTE — Research (Signed)
[facility] Visit 3 infusion 2  Pt doing well, no med changes. No blood draw today.                                     "CONSENT"   YES     NO   Continuing further Investigational Product and study visits for follow-up? [x]  []   Continuing consent from future biomedical research [x]  []                                    "EVENTS"    YES     NO  AE   (IF YES SEE SOURCE) []  [x]   SAE  (IF YES SEE SOURCE) []  [x]   ENDPOINT   (IF YES SEE SOURCE) []  [x]   REVASCULARIZATION  (IF YES SEE SOURCE) []  [x]   AMPUTATION   (IF YES SEE SOURCE) []  [x]   TROPONIN'S  (IF YES SEE SOURCE) []  [x]     Current Outpatient Medications:  .  aspirin 81 MG chewable tablet, Chew 1 tablet (81 mg total) by mouth daily., Disp: 90 tablet, Rfl: 3 .  atorvastatin (LIPITOR) 80 MG tablet, Take 1 tablet (80 mg total) by mouth daily at 6 PM., Disp: 90 tablet, Rfl: 3 .  clopidogrel (PLAVIX) 75 MG tablet, Take 1 tablet (75 mg total) by mouth daily with breakfast., Disp: 90 tablet, Rfl: 3 .  diphenhydrAMINE (BENADRYL) 25 MG tablet, Take 25 mg by mouth every 8 (eight) hours as needed for allergies. , Disp: , Rfl:  .  metoprolol succinate (TOPROL-XL) 25 MG 24 hr tablet, Take 0.5 tablets (12.5 mg total) by mouth daily., Disp: 45 tablet, Rfl: 3 No current facility-administered medications for this visit.   Facility-Administered Medications Ordered in Other Visits:  .  STUDY - AEGIS II - placebo or CSL112 ([place]), 170 mL, Intravenous, Q7 days, Pixie Casino, MD, Last Rate: 85 mL/hr at 03/23/18 0917, 170 mL at 03/23/18 [phone]

## 2018-03-23 NOTE — Telephone Encounter (Signed)
Pt recently d/c from hospital. Dr. Percival Spanish is primary card. Pt has appt with Almyra Deforest, PA 03/25/18. I will route to NL.

## 2018-03-24 ENCOUNTER — Encounter (HOSPITAL_COMMUNITY): Payer: Self-pay | Admitting: Cardiovascular Disease

## 2018-03-25 ENCOUNTER — Encounter: Payer: Self-pay | Admitting: Physician Assistant

## 2018-03-25 ENCOUNTER — Ambulatory Visit (INDEPENDENT_AMBULATORY_CARE_PROVIDER_SITE_OTHER): Payer: Medicare Other | Admitting: Physician Assistant

## 2018-03-25 ENCOUNTER — Other Ambulatory Visit: Payer: Self-pay

## 2018-03-25 VITALS — BP 104/60 | HR 74 | Ht 72.0 in | Wt 193.4 lb

## 2018-03-25 DIAGNOSIS — I471 Supraventricular tachycardia: Secondary | ICD-10-CM

## 2018-03-25 DIAGNOSIS — E785 Hyperlipidemia, unspecified: Secondary | ICD-10-CM

## 2018-03-25 DIAGNOSIS — I2581 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: Secondary | ICD-10-CM | POA: Diagnosis not present

## 2018-03-25 DIAGNOSIS — Z951 Presence of aortocoronary bypass graft: Secondary | ICD-10-CM

## 2018-03-25 DIAGNOSIS — I10 Essential (primary) hypertension: Secondary | ICD-10-CM

## 2018-03-25 NOTE — Progress Notes (Signed)
Cardiology Office Note    Date:  03/27/2018   ID:  Steven Mathews, DOB [birth year], MRN [id number]  PCP:  Cathlean Sauer, MD  Cardiologist: Dr. Percival Spanish Dr. Lovena Le  Chief Complaint  Patient presents with  . Follow-up    seen for Dr. Percival Spanish    History of Present Illness:  Steven Mathews is a 73 y.o. male with PMH of CAD s/p CABG, GERD, HTN, HLD and h/o SVT s/p ablation.  Echocardiogram obtained in September 2017 showed EF 50 to 55%, mild LVH, grade 1 DD.  Although atrial fibrillation was listed as part of his past medical history, however he is not on any systemic anticoagulation therapy.  More recently, patient presented to the hospital on 03/13/2018 with inferior STEMI.  Cardiac catheterization performed on 03/13/2018 showed 40% ostial to proximal LAD lesion, 30% proximal LAD lesion, 55% proximal left circumflex lesion, 100% occlusion of SVG to distal RCA with extensive thrombus.  Patent LIMA to mid LAD, chronically occluded SVG to obtuse marginal branch.  EF 40 to 45%.  Medical therapy was recommended.  Echocardiogram obtained on 03/15/2018 showed EF 60 to 65%, moderately reduced RVEF, mild dilatation of the aortic root.  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  Beta-blocker was initially held, however later was reintroduced at a low dose.  Patient was eventually discharged on aspirin and Plavix.  Patient presents today for cardiology office visit.  He denies any further chest discomfort.  He is back to building his guest house without any issue.  He is aware that it will take some time for him to get back to the previous activity level.  Otherwise he has been compliant with aspirin and Plavix.  Since his Lipitor has been increased, I plan to obtain a repeat fasting lipid panel and LFT in 4 weeks. Since the recent cath, he also has been discussing his diet and activity with his son-in-law Dr. Londell Moh of [facility] in  [place]. I made copy of his EKG for him to keep in his wallet   Past Medical History:  Diagnosis Date  . Atrial fibrillation (Steven Mathews)   . CAD (coronary artery disease)    CABG 2008; 03/13/2018 NSTEMI due to acute thrombotic occlusion of RCA graft, patent LIMA to LAD.  Marland Kitchen Chronic cough   . GERD (gastroesophageal reflux disease)   . Hx of adenomatous colonic polyps 04/04/2016  . Hyperlipidemia   . Hypertension   . Myocardial infarct (Steven Mathews)   . SVT (supraventricular tachycardia) ([facility])     Past Surgical History:  Procedure Laterality Date  . CORONARY ARTERY BYPASS GRAFT  03/2006  . LEFT HEART CATH AND CORS/GRAFTS ANGIOGRAPHY N/A 03/13/2018   Procedure: LEFT HEART CATH AND CORS/GRAFTS ANGIOGRAPHY;  Surgeon: Troy Sine, MD;  Location: [place] [facility];  Service: Cardiovascular;  Laterality: N/A;  . SUPRAVENTRICULAR TACHYCARDIA ABLATION N/A 09/03/2012   Procedure: SUPRAVENTRICULAR TACHYCARDIA ABLATION;  Surgeon: Evans Lance, MD;  Location: [facility] CATH LAB;  Service: Cardiovascular;  Laterality: N/A;    Current Medications: Outpatient Medications Prior to Visit  Medication Sig Dispense Refill  . aspirin 81 MG chewable tablet Chew 1 tablet (81 mg total) by mouth daily. 90 tablet 3  . atorvastatin (LIPITOR) 80 MG tablet Take 1 tablet (80 mg total) by mouth daily at 6 PM. 90 tablet 3  . clopidogrel (PLAVIX) 75 MG tablet Take 1 tablet (75 mg total) by mouth daily with breakfast. 90 tablet  3  . diphenhydrAMINE (BENADRYL) 25 MG tablet Take 25 mg by mouth every 8 (eight) hours as needed for allergies.     . metoprolol succinate (TOPROL-XL) 25 MG 24 hr tablet Take 0.5 tablets (12.5 mg total) by mouth daily. 45 tablet 3   No facility-administered medications prior to visit.      Allergies:   Patient has no known allergies.   Social History   Socioeconomic History  . Marital status: Married    Spouse name: Not on file  . Number of children: Not on file  . Years of education: Not on file  .  Highest education level: Not on file  Occupational History  . Not on file  Social Needs  . Financial resource strain: Not on file  . Food insecurity:    Worry: Not on file    Inability: Not on file  . Transportation needs:    Medical: Not on file    Non-medical: Not on file  Tobacco Use  . Smoking status: Never Smoker  . Smokeless tobacco: Never Used  Substance and Sexual Activity  . Alcohol use: Yes    Alcohol/week: 7.0 standard drinks    Types: 7 Glasses of wine per week    Comment: occasional  . Drug use: No  . Sexual activity: Not on file  Lifestyle  . Physical activity:    Days per week: Not on file    Minutes per session: Not on file  . Stress: Not on file  Relationships  . Social connections:    Talks on phone: Not on file    Gets together: Not on file    Attends religious service: Not on file    Active member of club or organization: Not on file    Attends meetings of clubs or organizations: Not on file    Relationship status: Not on file  Other Topics Concern  . Not on file  Social History Narrative  . Not on file     Family History:  The patient's family history includes Cancer in his mother; Colon cancer (age of onset: 88) in his brother; Liver cancer in his father.   ROS:   Please see the history of present illness.    ROS All other systems reviewed and are negative.   PHYSICAL EXAM:   VS:  BP 104/60   Pulse 74   Ht 6' (1.829 m)   Wt 193 lb 6.4 oz (87.7 kg)   BMI 26.23 kg/m    GEN: Well nourished, well developed, in no acute distress  HEENT: normal  Neck: no JVD, carotid bruits, or masses Cardiac: RRR; no murmurs, rubs, or gallops,no edema  Respiratory:  clear to auscultation bilaterally, normal work of breathing GI: soft, nontender, nondistended, + BS MS: no deformity or atrophy  Skin: warm and dry, no rash Neuro:  Alert and Oriented x 3, Strength and sensation are intact Psych: euthymic mood, full affect  Wt Readings from Last 3  Encounters:  03/25/18 193 lb 6.4 oz (87.7 kg)  03/23/18 192 lb (87.1 kg)  03/13/18 192 lb 10.9 oz (87.4 kg)      Studies/Labs Reviewed:   EKG:  EKG is ordered today.  The ekg ordered today demonstrates normal sinus rhythm, right bundle branch block, persistent mild ST elevation in inferior leads  Recent Labs: 03/13/2018: ALT 53 03/16/2018: BUN 13; Creatinine, Ser 1.27; Hemoglobin 12.0; Platelets 173; Potassium 3.9; Sodium 137   Lipid Panel    Component Value Date/Time  CHOL 168 03/13/2018 2055   TRIG 71 03/13/2018 2055   HDL 40 (L) 03/13/2018 2055   CHOLHDL 4.2 03/13/2018 2055   VLDL 14 03/13/2018 2055   LDLCALC 114 (H) 03/13/2018 2055   LDLDIRECT 114.1 06/16/2012 1032    Additional studies/ records that were reviewed today include:   Cath 03/13/2018  Ost LAD to Prox LAD lesion is 40% stenosed.  Prox LAD lesion is 30% stenosed.  Prox Cx lesion is 55% stenosed.  Origin to Prox Graft lesion is 100% stenosed.  Prox RCA lesion is 80% stenosed.  Prox RCA to Mid RCA lesion is 95% stenosed.  Mid RCA to Dist RCA lesion is 100% stenosed.  Prox Graft to Dist Graft lesion is 100% stenosed.  LV end diastolic pressure is mildly elevated.  There is mild left ventricular systolic dysfunction.  Ost 1st Mrg lesion is 70% stenosed.   Multivessel native coronary obstructive disease with 40 and 30% proximal LAD stenoses with competitive filling of the mid LAD via LIMA graft; small patent ramus intermediate vessel; 50 to 60% eccentric proximal left circumflex stenosis with smooth 70% ostial circumflex marginal stenosis with evidence for a very distal segment of the graft anastomosing into the mid OM vessel without competitive filling suggesting graft occlusion; 80% and 95% proximal to mid native RCA stenoses with diffuse 60% mid stenosis followed by total occlusion.  There is evidence for left to right collateralization to the PDA and distal vessels via the left coronary  injection.  Patent LIMA graft supplying the mid LAD.  Probable old occlusion of the vein graft which had supplied the obtuse marginal vessel.  Probable acutely occluded vein graft supplying the distal RCA with extensive thrombus throughout and TIMI 0 flow beyond the proximal third of the graft.  Mild LV dysfunction with an ejection fraction of 40 to 45% and evidence for focal basal inferior hypocontractility and focal apical to apical inferior hypocontractility.  LVEDP 19 mmHg.  RECOMMENDATION: Medical therapy.  The patient's mild ST elevation inferiorly most likely is due to extensive thrombus in the occluded vein graft supplying the distal RCA.  The patient had been off aspirin for over a week.  Will initiate Plavix 300 mg which was given in the lab and 75 mg daily for dual antiplatelet therapy.  Recommend aggressive medical therapy for concomitant CAD.  Aggressive lipid-lowering therapy with target LDL less than 70.  Will obtain 2D echo Doppler study.  We will try to to obtain more specific records of his CABG revascularization.   Echo 03/15/2018 1. The left ventricle has normal systolic function of 40-81%. The cavity size was normal. There is concentric left ventricular hypertrophy. Echo evidence of normal diastolic relaxation.  2. The right ventricle has moderately reduced systolic function. The cavity was mildly enlarged. There is no increase in right ventricular wall thickness.  3. The mitral valve is normal in structure. There is mild thickening. There is mild mitral annular calcification present.  4. The tricuspid valve is normal in structure.  5. The aortic valve is tricuspid There is mild thickening and sclerosis without any evidence of stenosis of the aortic valve.  6. There is mild dilatation of the aortic root.  7. The inferior vena cava was dilated in size with >50% respiratory variability.  8. No evidence of left ventricular regional wall motion abnormalities.  ASSESSMENT:     1. Coronary artery disease involving coronary bypass graft of native heart without angina pectoris   2. SVT (supraventricular tachycardia) ([place])  3. Hx of CABG   4. Hyperlipidemia, unspecified hyperlipidemia type   5. Essential hypertension      PLAN:  In order of problems listed above:  1. CAD s/p CABG: Denies any further chest discomfort, continue aspirin and Plavix.  Recent cardiac catheterization showed occluded SVG to distal RCA.  This will be managed medically.  Echocardiogram showed normal LVEF and reduced RVEF.  2. Hypertension: Blood pressure stable on current therapy  3. Hyperlipidemia: Continue Lipitor 80 mg daily.  Obtain fasting lipid panel in 4 weeks.  4. History of SVT s/p ablation: No obvious sign of recurrence.   Medication Adjustments/Labs and Tests Ordered: Current medicines are reviewed at length with the patient today.  Concerns regarding medicines are outlined above.  Medication changes, Labs and Tests ordered today are listed in the Patient Instructions below. Patient Instructions  Medication Instructions:   Your physician recommends that you continue on your current medications as directed. Please refer to the Current Medication list given to you today.  If you need a refill on your cardiac medications before your next appointment, please call your pharmacy.   Lab work: YOU WILL NEED TO RETURN IN 4 WEEKS FOR:  LIPID PANEL   If you have labs (blood work) drawn today and your tests are completely normal, you will receive your results only by: Marland Kitchen MyChart Message (if you have MyChart) OR . A paper copy in the mail If you have any lab test that is abnormal or we need to change your treatment, we will call you to review the results.  Testing/Procedures: NONE  Follow-Up: At [facility], you and your health needs are our priority.  As part of our continuing mission to provide you with exceptional heart care, we have created designated Provider Care  Teams.  These Care Teams include your primary Cardiologist (physician) and Advanced Practice Providers (APPs -  Physician Assistants and Nurse Practitioners) who all work together to provide you with the care you need, when you need it. You will need a follow up appointment in 2-3 months.  Please call our office 2 months in advance to schedule this appointment.  You may see Minus Breeding, MD or one of the following Advanced Practice Providers on your designated Care Team:   Rosaria Ferries, PA-C . Jory Sims, DNP, ANP        Signed, Almyra Deforest, [place]  03/27/2018 10:10 AM    [facility] [address], [place], [address] [place]  [address] Phone: [phone]; Fax: [phone]

## 2018-03-25 NOTE — Patient Instructions (Addendum)
Medication Instructions:   Your physician recommends that you continue on your current medications as directed. Please refer to the Current Medication list given to you today.  If you need a refill on your cardiac medications before your next appointment, please call your pharmacy.   Lab work: YOU WILL NEED TO RETURN IN 4 WEEKS FOR:  LIPID PANEL   If you have labs (blood work) drawn today and your tests are completely normal, you will receive your results only by: Marland Kitchen MyChart Message (if you have MyChart) OR . A paper copy in the mail If you have any lab test that is abnormal or we need to change your treatment, we will call you to review the results.  Testing/Procedures: NONE  Follow-Up: At [facility], you and your health needs are our priority.  As part of our continuing mission to provide you with exceptional heart care, we have created designated Provider Care Teams.  These Care Teams include your primary Cardiologist (physician) and Advanced Practice Providers (APPs -  Physician Assistants and Nurse Practitioners) who all work together to provide you with the care you need, when you need it. You will need a follow up appointment in 2-3 months.  Please call our office 2 months in advance to schedule this appointment.  You may see Minus Breeding, MD or one of the following Advanced Practice Providers on your designated Care Team:   Rosaria Ferries, PA-C . Jory Sims, DNP, ANP

## 2018-03-27 ENCOUNTER — Encounter: Payer: Self-pay | Admitting: Physician Assistant

## 2018-03-27 NOTE — Research (Signed)
Visit 2 PK

## 2018-03-30 ENCOUNTER — Encounter: Payer: Medicare Other | Admitting: *Deleted

## 2018-03-30 ENCOUNTER — Ambulatory Visit (HOSPITAL_COMMUNITY)
Admission: RE | Admit: 2018-03-30 | Discharge: 2018-03-30 | Disposition: A | Discharge status: Home / Self Care | Payer: Medicare Other | Source: Ambulatory Visit | Attending: Internal Medicine | Admitting: Internal Medicine

## 2018-03-30 VITALS — BP 109/75 | HR 83

## 2018-03-30 DIAGNOSIS — Z006 Encounter for examination for normal comparison and control in clinical research program: Secondary | ICD-10-CM

## 2018-03-30 MED ORDER — STUDY - AEGIS II STUDY - PLACEBO OR CSL112 (PI-HILTY)
170.0000 mL | Freq: Once | INTRAVENOUS | Status: AC
  Administered 2018-03-30: 170 mL via INTRAVENOUS
  Filled 2018-03-30: qty 170

## 2018-03-30 NOTE — Research (Signed)
[facility] visit 4  Pt doing well, no complaints of sob or cp. No med changes.                                    "CONSENT"   YES     NO   Continuing further Investigational Product and study visits for follow-up? [x]  []   Continuing consent from future biomedical research [x]  []                                    "EVENTS"    YES     NO  AE   (IF YES SEE SOURCE) []  [x]   SAE  (IF YES SEE SOURCE) []  [x]   ENDPOINT   (IF YES SEE SOURCE) []  [x]   REVASCULARIZATION  (IF YES SEE SOURCE) []  [x]   AMPUTATION   (IF YES SEE SOURCE) []  [x]   TROPONIN'S  (IF YES SEE SOURCE) []  [x]      Current Outpatient Medications:  .  aspirin 81 MG chewable tablet, Chew 1 tablet (81 mg total) by mouth daily., Disp: 90 tablet, Rfl: 3 .  atorvastatin (LIPITOR) 80 MG tablet, Take 1 tablet (80 mg total) by mouth daily at 6 PM., Disp: 90 tablet, Rfl: 3 .  clopidogrel (PLAVIX) 75 MG tablet, Take 1 tablet (75 mg total) by mouth daily with breakfast., Disp: 90 tablet, Rfl: 3 .  diphenhydrAMINE (BENADRYL) 25 MG tablet, Take 25 mg by mouth every 8 (eight) hours as needed for allergies. , Disp: , Rfl:  .  metoprolol succinate (TOPROL-XL) 25 MG 24 hr tablet, Take 0.5 tablets (12.5 mg total) by mouth daily., Disp: 45 tablet, Rfl: 3 No current facility-administered medications for this visit.   Facility-Administered Medications Ordered in Other Visits:  .  STUDY - AEGIS II - placebo or CSL112 (PI-Hilty), 170 mL, Intravenous, Once, Pixie Casino, MD, Last Rate: 85 mL/hr at 03/30/18 0917, 170 mL at 03/30/18 [phone]

## 2018-04-06 ENCOUNTER — Encounter: Payer: Medicare Other | Admitting: *Deleted

## 2018-04-06 ENCOUNTER — Ambulatory Visit (HOSPITAL_COMMUNITY)
Admission: RE | Admit: 2018-04-06 | Discharge: 2018-04-06 | Disposition: A | Discharge status: Home / Self Care | Payer: Medicare Other | Source: Ambulatory Visit | Attending: Internal Medicine | Admitting: Internal Medicine

## 2018-04-06 VITALS — BP 106/70 | HR 97

## 2018-04-06 DIAGNOSIS — Z006 Encounter for examination for normal comparison and control in clinical research program: Secondary | ICD-10-CM

## 2018-04-06 MED ORDER — STUDY - AEGIS II STUDY - PLACEBO OR CSL112 (PI-HILTY)
170.0000 mL | Freq: Once | INTRAVENOUS | Status: AC
  Administered 2018-04-06: 170 mL via INTRAVENOUS

## 2018-04-06 NOTE — Research (Signed)
Pt doing well, no complaints of cp or sob. Just got back from [facility] was a very nice time. Meds the same no changes. Will draw a PK end of infusion today.                                    "CONSENT"   YES     NO   Continuing further Investigational Product and study visits for follow-up? [x]  []   Continuing consent from future biomedical research [x]  []                                    "EVENTS"    YES     NO  AE   (IF YES SEE SOURCE) []  [x]   SAE  (IF YES SEE SOURCE) []  [x]   ENDPOINT   (IF YES SEE SOURCE) []  [x]   REVASCULARIZATION  (IF YES SEE SOURCE) []  [x]   AMPUTATION   (IF YES SEE SOURCE) []  [x]   TROPONIN'S  (IF YES SEE SOURCE) []  [x]     Current Outpatient Medications:  .  aspirin 81 MG chewable tablet, Chew 1 tablet (81 mg total) by mouth daily., Disp: 90 tablet, Rfl: 3 .  atorvastatin (LIPITOR) 80 MG tablet, Take 1 tablet (80 mg total) by mouth daily at 6 PM., Disp: 90 tablet, Rfl: 3 .  clopidogrel (PLAVIX) 75 MG tablet, Take 1 tablet (75 mg total) by mouth daily with breakfast., Disp: 90 tablet, Rfl: 3 .  diphenhydrAMINE (BENADRYL) 25 MG tablet, Take 25 mg by mouth every 8 (eight) hours as needed for allergies. , Disp: , Rfl:  .  metoprolol succinate (TOPROL-XL) 25 MG 24 hr tablet, Take 0.5 tablets (12.5 mg total) by mouth daily., Disp: 45 tablet, Rfl: 3 No current facility-administered medications for this visit.   Facility-Administered Medications Ordered in Other Visits:  .  STUDY - AEGIS II - placebo or CSL112 (PI-Hilty), 170 mL, Intravenous, Once, Hilty, Nadean Corwin, MD, Last Rate: 85 mL/hr at 04/06/18 0910, 170 mL at 04/06/18 0910

## 2018-04-08 ENCOUNTER — Telehealth (HOSPITAL_COMMUNITY): Payer: Self-pay

## 2018-04-08 NOTE — Telephone Encounter (Signed)
Called patient to see if he was interested in participating in the Cardiac Rehab Program. Patient stated yes. Patient will come in for orientation on 04/28/2018 @ 830AM and will attend the 11:15AM exercise class. Went over insurance, patient verbalized understanding.   Mailed homework package.

## 2018-04-13 ENCOUNTER — Encounter: Payer: Medicare Other | Admitting: *Deleted

## 2018-04-13 VITALS — BP 173/73 | HR 83 | Resp 16 | Wt 189.4 lb

## 2018-04-13 DIAGNOSIS — Z006 Encounter for examination for normal comparison and control in clinical research program: Secondary | ICD-10-CM

## 2018-04-13 NOTE — Research (Addendum)
Visit 6   Pt doing well, no complaints of cp or sob.  Central labs drawn today.                                   "CONSENT"   YES     NO   Continuing further Investigational Product and study visits for follow-up? [x]  []   Continuing consent from future biomedical research [x]  []                                    "EVENTS"    YES     NO  AE   (IF YES SEE SOURCE) []  [x]   SAE  (IF YES SEE SOURCE) []  [x]   ENDPOINT   (IF YES SEE SOURCE) []  [x]   REVASCULARIZATION  (IF YES SEE SOURCE) []  [x]   AMPUTATION   (IF YES SEE SOURCE) []  [x]   TROPONIN'S  (IF YES SEE SOURCE) []  [x]         Current Outpatient Medications:  .  aspirin 81 MG chewable tablet, Chew 1 tablet (81 mg total) by mouth daily., Disp: 90 tablet, Rfl: 3 .  atorvastatin (LIPITOR) 80 MG tablet, Take 1 tablet (80 mg total) by mouth daily at 6 PM., Disp: 90 tablet, Rfl: 3 .  clopidogrel (PLAVIX) 75 MG tablet, Take 1 tablet (75 mg total) by mouth daily with breakfast., Disp: 90 tablet, Rfl: 3 .  diphenhydrAMINE (BENADRYL) 25 MG tablet, Take 25 mg by mouth every 8 (eight) hours as needed for allergies. , Disp: , Rfl:  .  metoprolol succinate (TOPROL-XL) 25 MG 24 hr tablet, Take 0.5 tablets (12.5 mg total) by mouth daily., Disp: 45 tablet, Rfl: 3

## 2018-04-20 ENCOUNTER — Telehealth (HOSPITAL_COMMUNITY): Payer: Self-pay

## 2018-04-20 NOTE — Telephone Encounter (Signed)
Called pt and let him know that the department is closing the next 2 weeks

## 2018-04-28 ENCOUNTER — Ambulatory Visit (HOSPITAL_COMMUNITY): Payer: Medicare Other

## 2018-05-04 ENCOUNTER — Ambulatory Visit (HOSPITAL_COMMUNITY): Payer: Medicare Other

## 2018-05-04 ENCOUNTER — Encounter: Payer: Self-pay | Admitting: *Deleted

## 2018-05-04 DIAGNOSIS — Z006 Encounter for examination for normal comparison and control in clinical research program: Secondary | ICD-10-CM

## 2018-05-04 NOTE — Research (Signed)
[facility] Visit 7 by phone   Pt doing well, no complaints of chest pain or shortness of breath. All meds are the same.                                      "CONSENT"   YES     NO   Continuing further Investigational Product and study visits for follow-up? [x]  []   Continuing consent from future biomedical research [x]  []                                      "EVENTS"    YES     NO  AE   (IF YES SEE SOURCE) []  [x]   SAE  (IF YES SEE SOURCE) []  [x]   ENDPOINT   (IF YES SEE SOURCE) []  [x]   REVASCULARIZATION  (IF YES SEE SOURCE) []  [x]   AMPUTATION   (IF YES SEE SOURCE) []  [x]   TROPONIN'S  (IF YES SEE SOURCE) []  [x]      Current Outpatient Medications:  .  aspirin 81 MG chewable tablet, Chew 1 tablet (81 mg total) by mouth daily., Disp: 90 tablet, Rfl: 3 .  atorvastatin (LIPITOR) 80 MG tablet, Take 1 tablet (80 mg total) by mouth daily at 6 PM., Disp: 90 tablet, Rfl: 3 .  clopidogrel (PLAVIX) 75 MG tablet, Take 1 tablet (75 mg total) by mouth daily with breakfast., Disp: 90 tablet, Rfl: 3 .  diphenhydrAMINE (BENADRYL) 25 MG tablet, Take 25 mg by mouth every 8 (eight) hours as needed for allergies. , Disp: , Rfl:  .  metoprolol succinate (TOPROL-XL) 25 MG 24 hr tablet, Take 0.5 tablets (12.5 mg total) by mouth daily., Disp: 45 tablet, Rfl: 3

## 2018-05-06 ENCOUNTER — Ambulatory Visit (HOSPITAL_COMMUNITY): Payer: Medicare Other

## 2018-05-08 ENCOUNTER — Ambulatory Visit (HOSPITAL_COMMUNITY): Payer: Medicare Other

## 2018-05-11 ENCOUNTER — Ambulatory Visit (HOSPITAL_COMMUNITY): Payer: Medicare Other

## 2018-05-13 ENCOUNTER — Ambulatory Visit (HOSPITAL_COMMUNITY): Payer: Medicare Other

## 2018-05-15 ENCOUNTER — Ambulatory Visit (HOSPITAL_COMMUNITY): Payer: Medicare Other

## 2018-05-18 ENCOUNTER — Ambulatory Visit (HOSPITAL_COMMUNITY): Payer: Medicare Other

## 2018-05-19 ENCOUNTER — Telehealth: Payer: Self-pay

## 2018-05-19 NOTE — Telephone Encounter (Signed)
LEFT MESSAGE FOR PT TO RETURN CALL FOR APPT CHANGE TO VIRTUAL

## 2018-05-20 ENCOUNTER — Ambulatory Visit (HOSPITAL_COMMUNITY): Payer: Medicare Other

## 2018-05-22 ENCOUNTER — Ambulatory Visit (HOSPITAL_COMMUNITY): Payer: Medicare Other

## 2018-05-22 ENCOUNTER — Telehealth: Payer: Self-pay | Admitting: Cardiology

## 2018-05-22 NOTE — Telephone Encounter (Signed)
Mychart, smartphone, pre reg complete 05/22/18 AF °

## 2018-05-24 NOTE — Progress Notes (Signed)
Virtual Visit via Video Note   This visit type was conducted due to national recommendations for restrictions regarding the COVID-19 Pandemic (e.g. social distancing) in an effort to limit this patient's exposure and mitigate transmission in our community.  Due to his co-morbid illnesses, this patient is at least at moderate risk for complications without adequate follow up.  This format is felt to be most appropriate for this patient at this time.  All issues noted in this document were discussed and addressed.  A limited physical exam was performed with this format.  Please refer to the patient's chart for his consent to telehealth for [facility]   Evaluation Performed:  Follow-up visit  Date:  05/25/2018   ID:  Steven Mathews, DOB Oct 16, 1945, MRN [id number]  Patient Location: Home Provider Location: Home  PCP:  Cathlean Sauer, MD  Cardiologist:  Minus Breeding, MD  Electrophysiologist:  Cristopher Peru, MD   Chief Complaint:  Itching  History of Present Illness:    Steven Mathews is a 73 y.o. male with CAD s/p CABG.  Recently the patient presented to the hospital on 03/13/2018 with inferior STEMI.  Cardiac catheterization performed on 03/13/2018 showed 40% ostial to proximal LAD lesion, 30% proximal LAD lesion, 55% proximal left circumflex lesion, 100% occlusion of SVG to distal RCA with extensive thrombus.  Patent LIMA to mid LAD, chronically occluded SVG to obtuse marginal branch.  EF 40 to 45%.  Medical therapy was recommended.  Echocardiogram obtained on 03/15/2018 showed EF 60 to 65%, moderately reduced RVEF, mild dilatation of the aortic root.  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  Beta-blocker was initially held, however later was reintroduced at a low dose.  He was eventually discharged on aspirin and Plavix.  He presents for follow up.  Since I last saw him he is no further cardiovascular symptoms.  He denies any  chest pressure, neck or arm discomfort.  He denies any palpitations, presyncope or syncope.  He has had no PND or orthopnea.  Unfortunately he has had a rash.  It is not clear what this is related to although his dermatologist thought it was probably a drug reaction.  He stopped taking coenzyme Q10 and he had improvement in his symptoms for a while but now this has recurred on his shoulders.  It is quite bothersome.  Its very itchy.  Seems to come and go.  The patient does not have symptoms concerning for COVID-19 infection (fever, chills, cough, or new shortness of breath).    Past Medical History:  Diagnosis Date  . Atrial fibrillation ([place])   . CAD (coronary artery disease)    CABG 2008; 03/13/2018 NSTEMI due to acute thrombotic occlusion of RCA graft, patent LIMA to LAD.  Marland Kitchen Chronic cough   . GERD (gastroesophageal reflux disease)   . Hx of adenomatous colonic polyps 04/04/2016  . Hyperlipidemia   . Hypertension   . Myocardial infarct ([place])   . SVT (supraventricular tachycardia) (HCC)    Past Surgical History:  Procedure Laterality Date  . CORONARY ARTERY BYPASS GRAFT  03/2006  . LEFT HEART CATH AND CORS/GRAFTS ANGIOGRAPHY N/A 03/13/2018   Procedure: LEFT HEART CATH AND CORS/GRAFTS ANGIOGRAPHY;  Surgeon: Troy Sine, MD;  Location: [facility];  Service: Cardiovascular;  Laterality: N/A;  . SUPRAVENTRICULAR TACHYCARDIA ABLATION N/A 09/03/2012   Procedure: SUPRAVENTRICULAR TACHYCARDIA ABLATION;  Surgeon: Evans Lance, MD;  Location: [facility] CATH LAB;  Service:  Cardiovascular;  Laterality: N/A;     Prior to Admission medications   Medication Sig Start Date End Date Taking? Authorizing Provider  aspirin 81 MG chewable tablet Chew 1 tablet (81 mg total) by mouth daily. 03/17/18  Yes Daune Perch, NP  atorvastatin (LIPITOR) 80 MG tablet Take 1 tablet (80 mg total) by mouth daily at 6 PM. 03/16/18 03/11/19 Yes Daune Perch, NP  clopidogrel (PLAVIX) 75 MG tablet Take 1 tablet (75 mg  total) by mouth daily with breakfast. 03/17/18 03/12/19 Yes Daune Perch, NP  diphenhydrAMINE (BENADRYL) 25 MG tablet Take 25 mg by mouth every 8 (eight) hours as needed for allergies.    Yes [provider]  metoprolol succinate (TOPROL-XL) 25 MG 24 hr tablet Take 0.5 tablets (12.5 mg total) by mouth daily. 03/17/18 03/12/19 Yes Daune Perch, NP    Allergies:   Patient has no known allergies.   Social History   Tobacco Use  . Smoking status: Never Smoker  . Smokeless tobacco: Never Used  Substance Use Topics  . Alcohol use: Yes    Alcohol/week: 7.0 standard drinks    Types: 7 Glasses of wine per week    Comment: occasional  . Drug use: No     Family Hx: The patient's family history includes Cancer in his mother; Colon cancer (age of onset: 78) in his brother; Liver cancer in his father.  ROS:   Please see the history of present illness.    As stated in the HPI and negative for all other systems.   Prior CV studies:   The following studies were reviewed today:  Labs  Labs/Other Tests and Data Reviewed:    EKG:  No ECG reviewed.  Recent Labs: 03/13/2018: ALT 53 03/16/2018: BUN 13; Creatinine, Ser 1.27; Hemoglobin 12.0; Platelets 173; Potassium 3.9; Sodium 137   Recent Lipid Panel Lab Results  Component Value Date/Time   CHOL 168 03/13/2018 08:55 PM   TRIG 71 03/13/2018 08:55 PM   HDL 40 (L) 03/13/2018 08:55 PM   CHOLHDL 4.2 03/13/2018 08:55 PM   LDLCALC 114 (H) 03/13/2018 08:55 PM   LDLDIRECT 114.1 06/16/2012 10:32 AM    Wt Readings from Last 3 Encounters:  05/25/18 185 lb (83.9 kg)  04/13/18 189 lb 6.4 oz (85.9 kg)  04/06/18 192 lb (87.1 kg)     Objective:    Vital Signs:  BP 126/69   Pulse 75   Ht 6' (1.829 m)   Wt 185 lb (83.9 kg)   BMI 25.09 kg/m    VITAL SIGNS:  reviewed GEN:  no acute distress NEURO:  alert and oriented x 3, no obvious focal deficit PSYCH:  normal affect  ASSESSMENT & PLAN:    CAD s/p CABG:   I reviewed his  catheter.  I think he needs dual antiplatelet therapy but he might be having a rash related to the Plavix.  Therefore, I am going to start Brilinta to see if this helps.  Let me know if his rash is no better.   Hypertension:   The blood pressure at target.  No change in therapy.   Hyperlipidemia:     I would like to check a lipid profile in 1 month.  History of SVT s/p ablation:   No signs of recurrent arrhythmia.   COVID-19 Education: The signs and symptoms of COVID-19 were discussed with the patient and how to seek care for testing (follow up with PCP or arrange E-visit).  The importance of social distancing was discussed  today.  Time:   Today, I have spent 25 minutes with the patient with telehealth technology discussing the above problems.     Medication Adjustments/Labs and Tests Ordered: Current medicines are reviewed at length with the patient today.  Concerns regarding medicines are outlined above.   Tests Ordered: No orders of the defined types were placed in this encounter.   Medication Changes: No orders of the defined types were placed in this encounter.   Disposition:  Follow up 4 months.   Signed, Minus Breeding, MD  05/25/2018 12:50 PM    [facility]

## 2018-05-25 ENCOUNTER — Ambulatory Visit (HOSPITAL_COMMUNITY): Payer: Medicare Other

## 2018-05-25 ENCOUNTER — Telehealth (INDEPENDENT_AMBULATORY_CARE_PROVIDER_SITE_OTHER): Payer: Medicare Other | Admitting: Cardiology

## 2018-05-25 ENCOUNTER — Encounter: Payer: Self-pay | Admitting: Cardiology

## 2018-05-25 VITALS — BP 126/69 | HR 75 | Ht 72.0 in | Wt 185.0 lb

## 2018-05-25 DIAGNOSIS — I2581 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: Secondary | ICD-10-CM | POA: Diagnosis not present

## 2018-05-25 DIAGNOSIS — I10 Essential (primary) hypertension: Secondary | ICD-10-CM | POA: Insufficient documentation

## 2018-05-25 DIAGNOSIS — R21 Rash and other nonspecific skin eruption: Secondary | ICD-10-CM | POA: Insufficient documentation

## 2018-05-25 DIAGNOSIS — Z7189 Other specified counseling: Secondary | ICD-10-CM

## 2018-05-25 DIAGNOSIS — E785 Hyperlipidemia, unspecified: Secondary | ICD-10-CM

## 2018-05-25 MED ORDER — TICAGRELOR 60 MG PO TABS: 60.0000 mg | ORAL_TABLET | Freq: Two times a day (BID) | ORAL | 3 refills | Status: DC

## 2018-05-25 NOTE — Patient Instructions (Addendum)
Medication Instructions:  STOP- Plavix START- Brilinta 60 mg twice a day  If you need a refill on your cardiac medications before your next appointment, please call your pharmacy.  Labwork: None Ordered   Testing/Procedures: None Ordered   Follow-Up: . Your physician recommends that you schedule a follow-up appointment in: August 12th @9 :00 am .   At [facility], you and your health needs are our priority.  As part of our continuing mission to provide you with exceptional heart care, we have created designated Provider Care Teams.  These Care Teams include your primary Cardiologist (physician) and Advanced Practice Providers (APPs -  Physician Assistants and Nurse Practitioners) who all work together to provide you with the care you need, when you need it.  Thank you for choosing [facility] at [facility]!!

## 2018-05-27 ENCOUNTER — Ambulatory Visit (HOSPITAL_COMMUNITY): Payer: Medicare Other

## 2018-05-29 ENCOUNTER — Ambulatory Visit (HOSPITAL_COMMUNITY): Payer: Medicare Other

## 2018-06-01 ENCOUNTER — Ambulatory Visit (HOSPITAL_COMMUNITY): Payer: Medicare Other

## 2018-06-03 ENCOUNTER — Ambulatory Visit (HOSPITAL_COMMUNITY): Payer: Medicare Other

## 2018-06-04 ENCOUNTER — Telehealth (HOSPITAL_COMMUNITY): Payer: Self-pay

## 2018-06-05 ENCOUNTER — Ambulatory Visit (HOSPITAL_COMMUNITY): Payer: Medicare Other

## 2018-06-08 ENCOUNTER — Ambulatory Visit (HOSPITAL_COMMUNITY): Payer: Medicare Other

## 2018-06-10 ENCOUNTER — Ambulatory Visit (HOSPITAL_COMMUNITY): Payer: Medicare Other

## 2018-06-12 ENCOUNTER — Ambulatory Visit (HOSPITAL_COMMUNITY): Payer: Medicare Other

## 2018-06-15 ENCOUNTER — Ambulatory Visit (HOSPITAL_COMMUNITY): Payer: Medicare Other

## 2018-06-16 ENCOUNTER — Telehealth: Payer: Self-pay | Admitting: Cardiology

## 2018-06-16 NOTE — Telephone Encounter (Signed)
Tele visit/ consent/ my chart active/ pre reg completed

## 2018-06-16 NOTE — Progress Notes (Signed)
Virtual Visit via Telephone Note   This visit type was conducted due to national recommendations for restrictions regarding the COVID-19 Pandemic (e.g. social distancing) in an effort to limit this patient's exposure and mitigate transmission in our community.  Due to his co-morbid illnesses, this patient is at least at moderate risk for complications without adequate follow up.  This format is felt to be most appropriate for this patient at this time.  The patient did not have access to video technology/had technical difficulties with video requiring transitioning to audio format only (telephone).  All issues noted in this document were discussed and addressed.  No physical exam could be performed with this format.  Please refer to the patient's chart for his  consent to telehealth for [facility].   Date:  06/17/2018   ID:  Steven Mathews, DOB 1945-07-25, MRN [id number]  Patient Location: Home Provider Location: Home  PCP:  Cathlean Sauer, MD  Cardiologist:  Minus Breeding, MD  Electrophysiologist:  Cristopher Peru, MD   Evaluation Performed:  Follow-Up Visit  Chief Complaint:  Rash  History of Present Illness:    Steven Mathews is a 73 y.o. male with CAD s/p CABG.  Recently the patient presented to the hospital on 03/13/2018 with inferior STEMI. Cardiac catheterization performed on 03/13/2018 showed 40% ostial to proximal LAD lesion, 30% proximal LAD lesion, 55% proximal left circumflex lesion, 100% occlusion of SVG to distal RCA with extensive thrombus. Patent LIMA to mid LAD, chronically occluded SVG to obtuse marginal branch. EF 40 to 45%. Medical therapy was recommended. Echocardiogram obtained on 03/15/2018 showed EF 60 to 65%, moderately reduced RVEF, mild dilatation of the aortic root. 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 Beta-blocker was initially held, however later was reintroduced at a low dose. He was  eventually discharged on aspirin and Plavix.   However at the last visit he had a rash which I thought was related to Plavix so I switched him to [place].   He did switch to the Brilinta but he really did not notice a difference with his rash.  It was not when he stopped drinking wine that he started having no rash.  He denies any cardiovascular symptoms.  Is been walking the dog.  He denies any chest pressure, neck or arm discomfort.  He has had no palpitations, presyncope or syncope.  He has no weight gain or edema.  He has had no new shortness of breath, PND or orthopnea.  The patient does not have symptoms concerning for COVID-19 infection (fever, chills, cough, or new shortness of breath).    Past Medical History:  Diagnosis Date  . Atrial fibrillation ([place])   . CAD (coronary artery disease)    CABG 2008; 03/13/2018 NSTEMI due to acute thrombotic occlusion of RCA graft, patent LIMA to LAD.  Marland Kitchen Chronic cough   . GERD (gastroesophageal reflux disease)   . Hx of adenomatous colonic polyps 04/04/2016  . Hyperlipidemia   . Hypertension   . Myocardial infarct ([place])   . SVT (supraventricular tachycardia) (HCC)    Past Surgical History:  Procedure Laterality Date  . CORONARY ARTERY BYPASS GRAFT  03/2006  . LEFT HEART CATH AND CORS/GRAFTS ANGIOGRAPHY N/A 03/13/2018   Procedure: LEFT HEART CATH AND CORS/GRAFTS ANGIOGRAPHY;  Surgeon: Troy Sine, MD;  Location: [facility];  Service: Cardiovascular;  Laterality: N/A;  . SUPRAVENTRICULAR TACHYCARDIA ABLATION N/A 09/03/2012   Procedure: SUPRAVENTRICULAR TACHYCARDIA ABLATION;  Surgeon: Evans Lance, MD;  Location: [facility] CATH LAB;  Service: Cardiovascular;  Laterality: N/A;    Prior to Admission medications   Medication Sig Start Date End Date Taking? Authorizing Provider  aspirin 81 MG chewable tablet Chew 1 tablet (81 mg total) by mouth daily. 03/17/18  Yes Steven Perch, NP  atorvastatin (LIPITOR) 80 MG tablet Take 1 tablet (80 mg total)  by mouth daily at 6 PM. 03/16/18 03/11/19 Yes Steven Perch, NP  metoprolol succinate (TOPROL-XL) 25 MG 24 hr tablet Take 0.5 tablets (12.5 mg total) by mouth daily. 03/17/18 03/12/19 Yes Steven Perch, NP  ticagrelor (BRILINTA) 60 MG TABS tablet Take 1 tablet (60 mg total) by mouth 2 (two) times daily. 05/25/18  Yes Minus Breeding, MD     Allergies:   Patient has no known allergies.   Social History   Tobacco Use  . Smoking status: Never Smoker  . Smokeless tobacco: Never Used  Substance Use Topics  . Alcohol use: Yes    Alcohol/week: 7.0 standard drinks    Types: 7 Glasses of wine per week    Comment: occasional  . Drug use: No     Family Hx: The patient's family history includes Cancer in his mother; Colon cancer (age of onset: 22) in his brother; Liver cancer in his father.  ROS:   Please see the history of present illness.    As stated in the HPI and negative for all other systems.   Prior CV studies:   The following studies were reviewed today:  I reviewed his cath films with him  Labs/Other Tests and Data Reviewed:    EKG:  No ECG reviewed.  Recent Labs: 03/13/2018: ALT 53 03/16/2018: BUN 13; Creatinine, Ser 1.27; Hemoglobin 12.0; Platelets 173; Potassium 3.9; Sodium 137   Recent Lipid Panel Lab Results  Component Value Date/Time   CHOL 168 03/13/2018 08:55 PM   TRIG 71 03/13/2018 08:55 PM   HDL 40 (L) 03/13/2018 08:55 PM   CHOLHDL 4.2 03/13/2018 08:55 PM   LDLCALC 114 (H) 03/13/2018 08:55 PM   LDLDIRECT 114.1 06/16/2012 10:32 AM    Wt Readings from Last 3 Encounters:  06/17/18 185 lb (83.9 kg)  05/25/18 185 lb (83.9 kg)  04/13/18 189 lb 6.4 oz (85.9 kg)     Objective:    Vital Signs:  BP 135/70   Ht 6' (1.829 m)   Wt 185 lb (83.9 kg)   BMI 25.09 kg/m    VITAL SIGNS:  reviewed  ASSESSMENT & PLAN:    CADs/p CABG:  Since the Plavix was not caused rash he will continue with this.  No change in therapy otherwise.  I am going to keep him on DAPT  indefinitely given the extent of his disease.   Hypertension:   The blood pressure is at target.  No change in therapy.   Hyperlipidemia:  I would like to have a lipid profile the next time he has a lab draw.    History of SVT s/pablation:   He has had no further symptoms.    Time:   Today, I have spent 16 minutes with the patient with telehealth technology discussing the above problems.     Medication Adjustments/Labs and Tests Ordered: Current medicines are reviewed at length with the patient today.  Concerns regarding medicines are outlined above.   Tests Ordered: No orders of the defined types were placed in this encounter.   Medication Changes: No orders of the defined types were placed in  this encounter.   Disposition:  Follow up with me in the office in six months.   Signed, Minus Breeding, MD  06/17/2018 10:13 AM    [place]

## 2018-06-17 ENCOUNTER — Ambulatory Visit (HOSPITAL_COMMUNITY): Payer: Medicare Other

## 2018-06-17 ENCOUNTER — Encounter: Payer: Self-pay | Admitting: Cardiology

## 2018-06-17 ENCOUNTER — Telehealth (INDEPENDENT_AMBULATORY_CARE_PROVIDER_SITE_OTHER): Payer: Medicare Other | Admitting: Cardiology

## 2018-06-17 VITALS — BP 135/70 | Ht 72.0 in | Wt 185.0 lb

## 2018-06-17 DIAGNOSIS — I10 Essential (primary) hypertension: Secondary | ICD-10-CM

## 2018-06-17 DIAGNOSIS — E7800 Pure hypercholesterolemia, unspecified: Secondary | ICD-10-CM

## 2018-06-17 DIAGNOSIS — Z79899 Other long term (current) drug therapy: Secondary | ICD-10-CM

## 2018-06-17 DIAGNOSIS — I2510 Atherosclerotic heart disease of native coronary artery without angina pectoris: Secondary | ICD-10-CM

## 2018-06-17 NOTE — Patient Instructions (Signed)
Medication Instructions:  Continue current medications  If you need a refill on your cardiac medications before your next appointment, please call your pharmacy.  Labwork: Fasting Lipid and [place] HERE IN OUR OFFICE AT [facility]  You will need to fast. DO NOT EAT OR DRINK PAST MIDNIGHT.       Take the provided lab slips with you to the lab for your blood draw.   When you have your labs (blood work) drawn today and your tests are completely normal, you will receive your results only by MyChart Message (if you have MyChart) -OR-  A paper copy in the mail.  If you have any lab test that is abnormal or we need to change your treatment, we will call you to review these results.  Testing/Procedures: None Ordered  Follow-Up: You will need a follow up appointment in 1 Year.  Please call our office 2 months in advance to schedule this appointment.  You may see Minus Breeding, MD or one of the following Advanced Practice Providers on your designated Care Team:   Rosaria Ferries, PA-C . Jory Sims, DNP, ANP     At [facility], you and your health needs are our priority.  As part of our continuing mission to provide you with exceptional heart care, we have created designated Provider Care Teams.  These Care Teams include your primary Cardiologist (physician) and Advanced Practice Providers (APPs -  Physician Assistants and Nurse Practitioners) who all work together to provide you with the care you need, when you need it.  Thank you for choosing [facility] at [facility]!!

## 2018-06-19 ENCOUNTER — Encounter: Payer: Self-pay | Admitting: *Deleted

## 2018-06-19 ENCOUNTER — Ambulatory Visit (HOSPITAL_COMMUNITY): Payer: Medicare Other

## 2018-06-19 DIAGNOSIS — Z006 Encounter for examination for normal comparison and control in clinical research program: Secondary | ICD-10-CM

## 2018-06-19 NOTE — Research (Signed)
AEGIS visit 8 Visit completed on phone due to COVID 19 pandemic  Pt doing well, no complaints of cp or sob. Pt developed a rash in April, dermatologist didn't believe it was a drug reaction but Dr Percival Spanish d/c his  plavix and switched to Brilinta, with no changes, Dr Percival Spanish decided to switch him back to Plavix on May 13th. No rash at this time.                                     "CONSENT"   YES     NO   Continuing further Investigational Product and study visits for follow-up? [x]  []   Continuing consent from future biomedical research [x]  []                                    "EVENTS"    YES     NO  AE   (IF YES SEE SOURCE) [x]  []   SAE  (IF YES SEE SOURCE) []  [x]   ENDPOINT   (IF YES SEE SOURCE) []  [x]   REVASCULARIZATION  (IF YES SEE SOURCE) []  [x]   AMPUTATION   (IF YES SEE SOURCE) []  [x]   TROPONIN'S  (IF YES SEE SOURCE) []  [x]    Lifestyle Adherence Assessment:    YES NO  Abstinence from smoking/remaining tobacco free X   Cardiac Diet X   Routine physical activity and/or cardiac rehabilitation X    [id number]  MOBILITY:    I HAVE NO PROBLEMS WALKING [x]   I HAVE SLIGHT PROBLEMS WALKING []   I HAVE MODERATE PROBLEMS WALKING []   I HAVE SEVERE PROBLEMS WALKING []   I AM UNABLE TO WALK  []     SELF-CARE:   I HAVE NO PROBLEMS WASING OR DRESSING MYSELF  [x]   I HAVE SLIGHT PROBLEMS WASHING OR DRESSING MYSELF  []   I HAVE MODERATE PROBLEMS WASHING OR DRESSING MYSELF []   I HAVE SEVERE PROBLEMS WASHING OR DRESSING MYSELF  []   I HAVE SEVERE PROBLEMS WASHING OR DRESSING MYSELF  []   I AM UNABLE TO WASH OR DRESS MYSELF []     USUAL ACTIVITIES: (E.G. WORK/STUDY/HOUSEWORK/FAMILY OR LEISURE ACTIVITIES.    I HAVE NO PROBLEMS DOING MY USUAL ACTIVITIES [x]   I HAVE SLIGHT PROBLEMS DOING MY USUAL ACTIVITIES []   I HAVE MODERATE PROBLEMS DOING MY USUAL ACTIVIITIES []   I HAVE SEVERE PROBLEMS DOING MY USUAL ACTIVITIES []   I AM UNABLE TO DO MY USUAL ACTIVITIES []     PAIN /DISCOMFORT   I HAVE NO PAIN  OR DISCOMFORT [x]   I HAVE SLIGHT PAIN OR DISCOMFORT []   I HAVE MODERATE PAIN OR DISCOMFORT []   I HAVE SEVERE PAIN OR DISCOMFORT []   I HAVE EXTREME PAIN OR DISCOMFORT []     ANXIETY/DEPRESSION   I AM NOT ANXIOUS OR DEPRESSED [x]   I AM SLIGHTLY ANXIOUS OR DEPRESSED []   I AM MODERATELY ANXIOUS OR DREPRESSED []   I AM SEVERELY ANXIOUS OR DEPRESSED []   I AM EXTREMELY ANXIOUS OR DEPRESSED []     SCALE OF 0-100 HOW WOULD YOU RATE TODAY?  0 IS THE WORSE AND 100 IS THE BEST HEALTH YOU CAN IMAGINE: 80    Current Outpatient Medications:  .  aspirin 81 MG chewable tablet, Chew 1 tablet (81 mg total) by mouth daily., Disp: 90 tablet, Rfl: 3 .  atorvastatin (LIPITOR) 80 MG tablet, Take 1 tablet (80 mg total)  by mouth daily at 6 PM., Disp: 90 tablet, Rfl: 3 .  clopidogrel (PLAVIX) 75 MG tablet, Take 75 mg by mouth daily., Disp: , Rfl:  .  metoprolol succinate (TOPROL-XL) 25 MG 24 hr tablet, Take 0.5 tablets (12.5 mg total) by mouth daily., Disp: 45 tablet, Rfl: 3

## 2018-06-22 ENCOUNTER — Ambulatory Visit (HOSPITAL_COMMUNITY): Payer: Medicare Other

## 2018-06-24 ENCOUNTER — Ambulatory Visit (HOSPITAL_COMMUNITY): Payer: Medicare Other

## 2018-06-26 ENCOUNTER — Ambulatory Visit (HOSPITAL_COMMUNITY): Payer: Medicare Other

## 2018-07-01 ENCOUNTER — Ambulatory Visit (HOSPITAL_COMMUNITY): Payer: Medicare Other

## 2018-07-03 ENCOUNTER — Ambulatory Visit (HOSPITAL_COMMUNITY): Payer: Medicare Other

## 2018-07-06 ENCOUNTER — Ambulatory Visit (HOSPITAL_COMMUNITY): Payer: Medicare Other

## 2018-07-08 ENCOUNTER — Telehealth (HOSPITAL_COMMUNITY): Payer: Self-pay | Admitting: *Deleted

## 2018-07-08 ENCOUNTER — Ambulatory Visit (HOSPITAL_COMMUNITY): Payer: Medicare Other

## 2018-07-08 ENCOUNTER — Encounter (HOSPITAL_COMMUNITY): Payer: Self-pay | Admitting: *Deleted

## 2018-07-08 NOTE — Telephone Encounter (Addendum)
Pt called to inquire about desire to participate in virtual CR.  Pt would like to participate in virtual cardiac rehab.  Would like to schedule telephone appointment for 07/09/2018 at 1000 to set up app with cardiac rehab staff.

## 2018-07-08 NOTE — Telephone Encounter (Signed)
         Confirm Consent - In the setting of the current Covid19 crisis, you are scheduled for a phone visit with your Cardiac or Pulmonary team member.  Just as we do with many in-gym visits, in order for you to participate in this visit, we must obtain consent.  If you'd like, I can send this to your mychart (if signed up) or email for you to review.  Otherwise, I can obtain your verbal consent now.  By agreeing to a telephone visit, we'd like you to understand that the technology does not allow for your Cardiac or Pulmonary Rehab team member to perform a physical assessment, and thus may limit their ability to fully assess your ability to perform exercise programs. If your provider identifies any concerns that need to be evaluated in person, we will make arrangements to do so.  Finally, though the technology is pretty good, we cannot assure that it will always work on either your or our end and we cannot ensure that we have a secure connection.  Cardiac and Pulmonary Rehab Telehealth visits and "At Home" cardiac and pulmonary rehab are provided at no cost to you.               Are you willing to proceed?" STAFF: Did the patient verbally acknowledge consent to telehealth visit? Document YES/NO here: Steven Mathews   Cardiac and Pulmonary Rehab Staff   07/08/2018 11:44 AM

## 2018-07-09 ENCOUNTER — Encounter (HOSPITAL_COMMUNITY): Payer: Medicare Other

## 2018-07-09 ENCOUNTER — Telehealth (HOSPITAL_COMMUNITY): Payer: Self-pay

## 2018-07-09 NOTE — Telephone Encounter (Signed)
Phone to Pt to set up his virtual cardiac rehab application. Pt did not answer. Message was left for Pt to return call to set up the app.

## 2018-07-10 ENCOUNTER — Ambulatory Visit (HOSPITAL_COMMUNITY): Payer: Medicare Other

## 2018-07-13 ENCOUNTER — Ambulatory Visit (HOSPITAL_COMMUNITY): Payer: Medicare Other

## 2018-07-15 ENCOUNTER — Ambulatory Visit (HOSPITAL_COMMUNITY): Payer: Medicare Other

## 2018-07-16 ENCOUNTER — Telehealth (HOSPITAL_COMMUNITY): Payer: Self-pay | Admitting: *Deleted

## 2018-07-17 ENCOUNTER — Ambulatory Visit (HOSPITAL_COMMUNITY): Payer: Medicare Other

## 2018-07-20 ENCOUNTER — Ambulatory Visit (HOSPITAL_COMMUNITY): Payer: Medicare Other

## 2018-07-21 ENCOUNTER — Encounter (HOSPITAL_COMMUNITY): Payer: Self-pay

## 2018-07-21 ENCOUNTER — Telehealth (HOSPITAL_COMMUNITY): Payer: Self-pay | Admitting: *Deleted

## 2018-07-21 NOTE — Telephone Encounter (Signed)
Pt is interested in participating in Virtual Cardiac Rehab. Pt advised that Virtual Cardiac Rehab is provided at no cost to the patient.  Checklist:  1. Pt has smart device  ie smartphone and/or ipad for downloading an app  Yes 2. Reliable internet/wifi service    Yes 3. Understands how to use their smartphone and navigate within an app.  Yes   Reviewed with pt the scheduling process for virtual cardiac rehab.  Pt verbalized understanding.  Barnet Pall, RN,BSN 07/21/2018 10:03 AM

## 2018-07-22 ENCOUNTER — Encounter (HOSPITAL_COMMUNITY)
Admission: RE | Admit: 2018-07-22 | Discharge: 2018-07-22 | Disposition: A | Discharge status: Home / Self Care | Payer: Self-pay | Source: Ambulatory Visit | Attending: Cardiology | Admitting: Cardiology

## 2018-07-22 ENCOUNTER — Ambulatory Visit (HOSPITAL_COMMUNITY): Payer: Medicare Other

## 2018-07-22 ENCOUNTER — Telehealth (HOSPITAL_COMMUNITY): Payer: Self-pay | Admitting: *Deleted

## 2018-07-22 ENCOUNTER — Other Ambulatory Visit: Payer: Self-pay

## 2018-07-22 NOTE — Telephone Encounter (Signed)
Called and spoke to pt regarding Virtual Cardiac Rehab.  Pt  was able to download the [employer] app on their smart device with no issues. Pt set up their account and received the following welcome message -"Welcome to the [place] and Pulmonary Rehabilitation program. We hope that you will find the exercise program beneficial in your recovery process. Our staff is available to assist with any questions/concerns about your exercise routine. Best wishes". Brief orientation provided to with the advisement to watch the "Intro to Rehab" series located under the Resource tab. Pt verbalized understanding. Will continue to follow and monitor pt progress with feedback as needed.Barnet Pall, RN,BSN 07/22/2018 9:51 AM

## 2018-07-24 ENCOUNTER — Ambulatory Visit (HOSPITAL_COMMUNITY): Payer: Medicare Other

## 2018-07-27 ENCOUNTER — Ambulatory Visit (HOSPITAL_COMMUNITY): Payer: Medicare Other

## 2018-07-29 ENCOUNTER — Ambulatory Visit (HOSPITAL_COMMUNITY): Payer: Medicare Other

## 2018-07-31 ENCOUNTER — Ambulatory Visit (HOSPITAL_COMMUNITY): Payer: Medicare Other

## 2018-08-03 ENCOUNTER — Ambulatory Visit (HOSPITAL_COMMUNITY): Payer: Medicare Other

## 2018-08-04 ENCOUNTER — Encounter (HOSPITAL_COMMUNITY): Payer: Self-pay | Admitting: *Deleted

## 2018-08-04 NOTE — Progress Notes (Signed)
Pt inactive in [employer] App.  Letter mailed requesting patient to return call regarding this by 08/18/2018. If no response, will discharge from cardiac rehab program.  

## 2018-08-05 ENCOUNTER — Ambulatory Visit (HOSPITAL_COMMUNITY): Payer: Medicare Other

## 2018-09-14 NOTE — Progress Notes (Signed)
Cardiology Office Note   Date:  09/16/2018   ID:  Jonta Gastineau, DOB [birth year], MRN [id number]  PCP:  Cathlean Sauer, MD  Cardiologist:   Minus Breeding, MD  Chief Complaint  Patient presents with  . Coronary Artery Disease      History of Present Illness: Steven Mathews is a 73 y.o. male with CAD s/p CABG. He presented to the hospital on 03/13/2018 with inferior STEMI. Cardiac catheterization performed on 03/13/2018 showed 40% ostial to proximal LAD lesion, 30% proximal LAD lesion, 55% proximal left circumflex lesion, 100% occlusion of SVG to distal RCA with extensive thrombus. He had a patent LIMA to mid LAD, chronically occluded SVG to obtuse marginal branch. EF 40 to 45%. Medical therapy was recommended. Echocardiogram obtained on 03/15/2018 showed EF 60 to 65%, moderately reduced RVEF, mild dilatation of the aortic root. 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 Beta-blocker was initially held, however later was reintroduced at a low dose.Hewas eventually discharged on aspirin and Plavix.   However he had a rash which I thought was related to Plavix so I switched him to Brilinta. .  He subsequently found this to be EtOH.  He went back to Plavix.  The patient denies any new symptoms such as chest discomfort, neck or arm discomfort. There has been no new shortness of breath, PND or orthopnea. There have been no reported palpitations, presyncope or syncope.   He is building a Designer, fashion/clothing.     Past Medical History:  Diagnosis Date  . Atrial fibrillation ([place])   . CAD (coronary artery disease)    CABG 2008; 03/13/2018 NSTEMI due to acute thrombotic occlusion of RCA graft, patent LIMA to LAD.  Marland Kitchen Chronic cough   . GERD (gastroesophageal reflux disease)   . Hx of adenomatous colonic polyps 04/04/2016  . Hyperlipidemia   . Hypertension   . Myocardial infarct ([place])   . SVT (supraventricular tachycardia) (HCC)      Past Surgical History:  Procedure Laterality Date  . CORONARY ARTERY BYPASS GRAFT  03/2006  . LEFT HEART CATH AND CORS/GRAFTS ANGIOGRAPHY N/A 03/13/2018   Procedure: LEFT HEART CATH AND CORS/GRAFTS ANGIOGRAPHY;  Surgeon: Troy Sine, MD;  Location: [place] [facility];  Service: Cardiovascular;  Laterality: N/A;  . SUPRAVENTRICULAR TACHYCARDIA ABLATION N/A 09/03/2012   Procedure: SUPRAVENTRICULAR TACHYCARDIA ABLATION;  Surgeon: Evans Lance, MD;  Location: [facility];  Service: Cardiovascular;  Laterality: N/A;     Current Outpatient Medications  Medication Sig Dispense Refill  . aspirin 81 MG chewable tablet Chew 1 tablet (81 mg total) by mouth daily. 90 tablet 3  . atorvastatin (LIPITOR) 80 MG tablet Take 1 tablet (80 mg total) by mouth daily at 6 PM. 90 tablet 3  . clopidogrel (PLAVIX) 75 MG tablet Take 75 mg by mouth daily.    . metoprolol succinate (TOPROL-XL) 25 MG 24 hr tablet Take 0.5 tablets (12.5 mg total) by mouth daily. 45 tablet 3   No current facility-administered medications for this visit.     Allergies:   Patient has no known allergies.    ROS:  Please see the history of present illness.   Otherwise, review of systems are positive for none.   All other systems are reviewed and negative.    PHYSICAL EXAM: VS:  BP 100/62   Pulse 78   Temp 97.9 F (36.6 C) (Temporal)   Ht 6' (1.829 m)   [facility]  190 lb 6.4 oz (86.4 kg)   SpO2 99%   BMI 25.82 kg/m  , BMI Body mass index is 25.82 kg/m. GENERAL:  Well appearing NECK:  No jugular venous distention, waveform within normal limits, carotid upstroke brisk and symmetric, no bruits, no thyromegaly LUNGS:  Clear to auscultation bilaterally CHEST:  Well healed sternotomy scar. HEART:  PMI not displaced or sustained,S1 and S2 within normal limits, no S3, no S4, no clicks, no rubs, no murmurs ABD:  Flat, positive bowel sounds normal in frequency in pitch, no bruits, no rebound, no guarding, no midline pulsatile mass, no  hepatomegaly, no splenomegaly EXT:  2 plus pulses throughout, no edema, no cyanosis no clubbing   EKG:  EKG is not ordered today.   Recent Labs: 03/13/2018: ALT 53 03/16/2018: BUN 13; Creatinine, Ser 1.27; Hemoglobin 12.0; Platelets 173; Potassium 3.9; Sodium 137    Lipid Panel    Component Value Date/Time   CHOL 168 03/13/2018 2055   TRIG 71 03/13/2018 2055   HDL 40 (L) 03/13/2018 2055   CHOLHDL 4.2 03/13/2018 2055   VLDL 14 03/13/2018 2055   LDLCALC 114 (H) 03/13/2018 2055   LDLDIRECT 114.1 06/16/2012 1032      Wt Readings from Last 3 Encounters:  09/16/18 190 lb 6.4 oz (86.4 kg)  06/17/18 185 lb (83.9 kg)  05/25/18 185 lb (83.9 kg)      Other studies Reviewed: Additional studies/ records that were reviewed today include: None. Review of the above records demonstrates:  Please see elsewhere in the note.     ASSESSMENT AND PLAN:  CADs/p CABG:  The patient has no new sypmtoms.  No further cardiovascular testing is indicated.  We will continue with aggressive risk reduction and meds as listed.  Hypertension:The blood pressure is at target. No change in medications is indicated. We will continue with therapeutic lifestyle changes (TLC).  Hyperlipidemia:   I will check a lipid profile.  If he is not at target I will switch him to Crestor.  History of SVT s/pablation: No change in therapy.   Current medicines are reviewed at length with the patient today.  The patient does not have concerns regarding medicines.  The following changes have been made:  no change  Labs/ tests ordered today include: None  Orders Placed This Encounter  Procedures  . Lipid panel     Disposition:   FU with me in Feb of next year.     Signed, Minus Breeding, MD  09/16/2018 9:35 AM    [facility]

## 2018-09-16 ENCOUNTER — Ambulatory Visit (INDEPENDENT_AMBULATORY_CARE_PROVIDER_SITE_OTHER): Payer: Medicare Other | Admitting: Cardiology

## 2018-09-16 ENCOUNTER — Other Ambulatory Visit: Payer: Self-pay

## 2018-09-16 ENCOUNTER — Encounter: Payer: Self-pay | Admitting: Cardiology

## 2018-09-16 VITALS — BP 100/62 | HR 78 | Temp 97.9°F | Ht 72.0 in | Wt 190.4 lb

## 2018-09-16 DIAGNOSIS — Z5181 Encounter for therapeutic drug level monitoring: Secondary | ICD-10-CM | POA: Diagnosis not present

## 2018-09-16 DIAGNOSIS — I2581 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: Secondary | ICD-10-CM

## 2018-09-16 DIAGNOSIS — E7800 Pure hypercholesterolemia, unspecified: Secondary | ICD-10-CM

## 2018-09-16 LAB — LIPID PANEL
Chol/HDL Ratio: 3.4 ratio (ref 0.0–5.0)
Cholesterol, Total: 135 mg/dL (ref 100–199)
HDL: 40 mg/dL (ref >39)
LDL Calculated: 73 mg/dL (ref 0–99)
Triglycerides: 109 mg/dL (ref 0–149)
VLDL Cholesterol Cal: 22 mg/dL (ref 5–40)

## 2018-09-16 NOTE — Patient Instructions (Addendum)
Medication Instructions:  Your physician recommends that you continue on your current medications as directed. Please refer to the Current Medication list given to you today.  If you need a refill on your cardiac medications before your next appointment, please call your pharmacy.   Lab work: LIPID PANEL TODAY   Testing/Procedures: NONE  Follow-Up: At [facility], you and your health needs are our priority.  As part of our continuing mission to provide you with exceptional heart care, we have created designated Provider Care Teams.  These Care Teams include your primary Cardiologist (physician) and Advanced Practice Providers (APPs -  Physician Assistants and Nurse Practitioners) who all work together to provide you with the care you need, when you need it. You will need a follow up appointment in 6 months.  Please call our office 2 months in advance to schedule this appointment.  You may see Minus Breeding, MD or one of the following Advanced Practice Providers on your designated Care Team:   Rosaria Ferries, PA-C . Jory Sims, DNP, ANP

## 2018-09-17 NOTE — Research (Signed)
PK V5 [employer]

## 2018-09-30 ENCOUNTER — Encounter: Payer: Self-pay | Admitting: *Deleted

## 2018-09-30 DIAGNOSIS — Z006 Encounter for examination for normal comparison and control in clinical research program: Secondary | ICD-10-CM

## 2018-09-30 NOTE — Research (Signed)
V9 Visit by phone  Pt doing well, no complaints of cp or sob. No med changes per patient.                                      "CONSENT"   YES     NO   Continuing further Investigational Product and study visits for follow-up? [x]  []   Continuing consent from future biomedical research [x]  []                                    "EVENTS"    YES     NO  AE   (IF YES SEE SOURCE) []  [x]   SAE  (IF YES SEE SOURCE) []  [x]   ENDPOINT   (IF YES SEE SOURCE) []  [x]   REVASCULARIZATION  (IF YES SEE SOURCE) []  [x]   AMPUTATION   (IF YES SEE SOURCE) []  [x]   TROPONIN'S  (IF YES SEE SOURCE) []  [x]    Lifestyle Adherence Assessment:    YES NO  Abstinence from smoking/remaining tobacco free X   Cardiac Diet X   Routine physical activity and/or cardiac rehabilitation X     Current Outpatient Medications:  .  aspirin 81 MG chewable tablet, Chew 1 tablet (81 mg total) by mouth daily., Disp: 90 tablet, Rfl: 3 .  atorvastatin (LIPITOR) 80 MG tablet, Take 1 tablet (80 mg total) by mouth daily at 6 PM., Disp: 90 tablet, Rfl: 3 .  clopidogrel (PLAVIX) 75 MG tablet, Take 75 mg by mouth daily., Disp: , Rfl:  .  metoprolol succinate (TOPROL-XL) 25 MG 24 hr tablet, Take 0.5 tablets (12.5 mg total) by mouth daily., Disp: 45 tablet, Rfl: 3

## 2018-11-04 NOTE — Research (Signed)
Patient was seen with Ms. Fruitvale today.  Previously presented with an occluded SVG to the distal RCA and mild STE, treated medically.  Doing well. Son in law is CV at [employer] at [employer]   BP modestly elevated today Lungs clear. JVP not elevated.   [place] Class I   Patient at V6 for [place] II and stable.  Bing Quarry, MD, [place] Director, [facility]

## 2018-12-01 ENCOUNTER — Encounter: Payer: Self-pay | Admitting: *Deleted

## 2018-12-01 DIAGNOSIS — Z006 Encounter for examination for normal comparison and control in clinical research program: Secondary | ICD-10-CM

## 2018-12-01 NOTE — Research (Signed)
[facility]GIS Visit 10 phone visit   Patient doing well, no complaints of cp or sob. No med changes per patient.                                    "CONSENT"   YES     NO   Continuing further Investigational Product and study visits for follow-up? [x]  []   Continuing consent from future biomedical research [x]  []                                    "EVENTS"    YES     NO  AE   (IF YES SEE SOURCE) []  [x]   SAE  (IF YES SEE SOURCE) []  [x]   ENDPOINT   (IF YES SEE SOURCE) []  [x]   REVASCULARIZATION  (IF YES SEE SOURCE) []  [x]   AMPUTATION   (IF YES SEE SOURCE) []  [x]   TROPONIN'S  (IF YES SEE SOURCE) []  [x]     Current Outpatient Medications:  .  aspirin 81 MG chewable tablet, Chew 1 tablet (81 mg total) by mouth daily., Disp: 90 tablet, Rfl: 3 .  atorvastatin (LIPITOR) 80 MG tablet, Take 1 tablet (80 mg total) by mouth daily at 6 PM., Disp: 90 tablet, Rfl: 3 .  clopidogrel (PLAVIX) 75 MG tablet, Take 75 mg by mouth daily., Disp: , Rfl:  .  metoprolol succinate (TOPROL-XL) 25 MG 24 hr tablet, Take 0.5 tablets (12.5 mg total) by mouth daily., Disp: 45 tablet, Rfl: 3

## 2019-03-02 NOTE — Progress Notes (Signed)
Cardiology Office Note   Date:  03/03/2019   ID:  Steven Mathews, DOB [birth year], MRN [id number]  PCP:  Cathlean Sauer, MD  Cardiologist:   Minus Breeding, MD  Chief Complaint  Patient presents with  . Coronary Artery Disease      History of Present Illness: Steven Mathews is a 74 y.o. male with CAD s/p CABG. He presented to the hospital on 03/13/2018 with inferior STEMI. Cardiac catheterization performed on 03/13/2018 showed 40% ostial to proximal LAD lesion, 30% proximal LAD lesion, 55% proximal left circumflex lesion, 100% occlusion of SVG to distal RCA with extensive thrombus. He had a patent LIMA to mid LAD, chronically occluded SVG to obtuse marginal branch. EF 40 to 45%. Medical therapy was recommended. Echocardiogram obtained on 03/15/2018 showed EF 60 to 65%, moderately reduced RVEF, mild dilatation of the aortic root. 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 Beta-blocker was initially held, however later was reintroduced at a low dose.  He has done very well since I last saw him.  He exercises routinely.  He walks with dog and he is getting 10 pounds. The patient denies any new symptoms such as chest discomfort, neck or arm discomfort. There has been no new shortness of breath, PND or orthopnea. There have been no reported palpitations, presyncope or syncope.   Past Medical History:  Diagnosis Date  . Atrial fibrillation ([place])   . CAD (coronary artery disease)    CABG 2008; 03/13/2018 NSTEMI due to acute thrombotic occlusion of RCA graft, patent LIMA to LAD.  Marland Kitchen Chronic cough   . GERD (gastroesophageal reflux disease)   . Hx of adenomatous colonic polyps 04/04/2016  . Hyperlipidemia   . Hypertension   . Myocardial infarct ([place])   . SVT (supraventricular tachycardia) (HCC)     Past Surgical History:  Procedure Laterality Date  . CORONARY ARTERY BYPASS GRAFT  03/2006  . LEFT HEART CATH AND CORS/GRAFTS  ANGIOGRAPHY N/A 03/13/2018   Procedure: LEFT HEART CATH AND CORS/GRAFTS ANGIOGRAPHY;  Surgeon: Troy Sine, MD;  Location: [place] [facility];  Service: Cardiovascular;  Laterality: N/A;  . SUPRAVENTRICULAR TACHYCARDIA ABLATION N/A 09/03/2012   Procedure: SUPRAVENTRICULAR TACHYCARDIA ABLATION;  Surgeon: Evans Lance, MD;  Location: [facility] CATH [facility];  Service: Cardiovascular;  Laterality: N/A;     Current Outpatient Medications  Medication Sig Dispense Refill  . aspirin 81 MG chewable tablet Chew 1 tablet (81 mg total) by mouth daily. 90 tablet 3  . atorvastatin (LIPITOR) 80 MG tablet Take 1 tablet (80 mg total) by mouth daily at 6 PM. 90 tablet 3  . clopidogrel (PLAVIX) 75 MG tablet Take 75 mg by mouth daily.    . metoprolol succinate (TOPROL-XL) 25 MG 24 hr tablet Take 0.5 tablets (12.5 mg total) by mouth daily. 45 tablet 3   No current facility-administered medications for this visit.    Allergies:   Patient has no known allergies.    ROS:  Please see the history of present illness.   Otherwise, review of systems are positive for non.   All other systems are reviewed and negative.    PHYSICAL EXAM: VS:  BP 117/76   Pulse 74   Temp (!) 96.8 F (36 C)   Ht 6' (1.829 m)   Wt 201 lb 3.2 oz (91.3 kg)   SpO2 99%   BMI 27.29 kg/m  , BMI Body mass index is 27.29 kg/m. GENERAL:  Well  appearing NECK:  No jugular venous distention, waveform within normal limits, carotid upstroke brisk and symmetric, no bruits, no thyromegaly LUNGS:  Clear to auscultation bilaterally CHEST:  Well healed sternotomy scar. HEART:  PMI not displaced or sustained,S1 and S2 within normal limits, no S3, no S4, no clicks, no rubs, no murmurs ABD:  Flat, positive bowel sounds normal in frequency in pitch, no bruits, no rebound, no guarding, no midline pulsatile mass, no hepatomegaly, no splenomegaly EXT:  2 plus pulses throughout, no edema, no cyanosis no clubbing     EKG:  EKG is ordered today. Sinus  rhythm, rate 75, right bundle branch block, no acute ST-T wave.  Recent Labs: 03/13/2018: ALT 53 03/16/2018: BUN 13; Creatinine, Ser 1.27; Hemoglobin 12.0; Platelets 173; Potassium 3.9; Sodium 137    Lipid Panel    Component Value Date/Time   CHOL 135 09/16/2018 0939   TRIG 109 09/16/2018 0939   HDL 40 09/16/2018 0939   CHOLHDL 3.4 09/16/2018 0939   CHOLHDL 4.2 03/13/2018 2055   VLDL 14 03/13/2018 2055   LDLCALC 73 09/16/2018 0939   LDLDIRECT 114.1 06/16/2012 1032      Wt Readings from Last 3 Encounters:  03/03/19 201 lb 3.2 oz (91.3 kg)  09/16/18 190 lb 6.4 oz (86.4 kg)  06/17/18 185 lb (83.9 kg)      Other studies Reviewed: Additional studies/ records that were reviewed today include: None Review of the above records demonstrates:  NA   ASSESSMENT AND PLAN:  CADs/p CABG:     The patient is doing quite well.  I reviewed his cath report and given the amount of thrombus he had in his vein graft I would choose to continue his dual antiplatelet therapy indefinitely.  He has low bleeding risk and no problems taking this current regimen.  No change in therapy.   Hypertension:The blood pressure is at target.  No change in therapy.   Hyperlipidemia:   LDL was 73, HDL 40 continue current therapy.  Covid education: We will plan on signing after the vaccine hypokinesis.  Current medicines are reviewed at length with the patient today.  The patient does not have concerns regarding medicines.  The following changes have been made:  no change  Labs/ tests ordered today include: None  Orders Placed This Encounter  Procedures  . EKG 12-Lead     Disposition:   FU with me in Feb of next year.     Signed, Minus Breeding, MD  03/03/2019 9:48 AM    [facility]

## 2019-03-03 ENCOUNTER — Other Ambulatory Visit: Payer: Self-pay

## 2019-03-03 ENCOUNTER — Ambulatory Visit (INDEPENDENT_AMBULATORY_CARE_PROVIDER_SITE_OTHER): Payer: Medicare (Managed Care) | Admitting: Cardiology

## 2019-03-03 ENCOUNTER — Encounter: Payer: Self-pay | Admitting: Cardiology

## 2019-03-03 VITALS — BP 117/76 | HR 74 | Temp 96.8°F | Ht 72.0 in | Wt 201.2 lb

## 2019-03-03 DIAGNOSIS — E785 Hyperlipidemia, unspecified: Secondary | ICD-10-CM | POA: Diagnosis not present

## 2019-03-03 DIAGNOSIS — Z7189 Other specified counseling: Secondary | ICD-10-CM | POA: Diagnosis not present

## 2019-03-03 DIAGNOSIS — I10 Essential (primary) hypertension: Secondary | ICD-10-CM

## 2019-03-03 DIAGNOSIS — I2510 Atherosclerotic heart disease of native coronary artery without angina pectoris: Secondary | ICD-10-CM | POA: Diagnosis not present

## 2019-03-03 NOTE — Patient Instructions (Signed)
Medication Instructions:  No Changes *If you need a refill on your cardiac medications before your next appointment, please call your pharmacy*  Lab Work: None  Testing/Procedures: None  Follow-Up: At [facility], you and your health needs are our priority.  As part of our continuing mission to provide you with exceptional heart care, we have created designated Provider Care Teams.  These Care Teams include your primary Cardiologist (physician) and Advanced Practice Providers (APPs -  Physician Assistants and Nurse Practitioners) who all work together to provide you with the care you need, when you need it.  Your next appointment:   1 year(s)  You will receive a reminder letter in the mail two months in advance. If you don't receive a letter, please call our office to schedule the follow-up appointment.   The format for your next appointment:   In Person  Provider:   Mahki Hochrein, MD   

## 2019-03-22 ENCOUNTER — Other Ambulatory Visit: Payer: Self-pay | Admitting: Cardiology

## 2019-03-23 NOTE — Telephone Encounter (Signed)
This is Dr. Konrad Dolores pt

## 2019-03-28 ENCOUNTER — Other Ambulatory Visit: Payer: Self-pay | Admitting: Cardiology

## 2019-03-31 ENCOUNTER — Other Ambulatory Visit: Payer: Self-pay

## 2019-03-31 MED ORDER — CLOPIDOGREL BISULFATE 75 MG PO TABS: 75.0000 mg | ORAL_TABLET | Freq: Every day | ORAL | 3 refills | Status: DC

## 2019-04-05 ENCOUNTER — Encounter: Payer: Self-pay | Admitting: *Deleted

## 2019-04-05 DIAGNOSIS — Z006 Encounter for examination for normal comparison and control in clinical research program: Secondary | ICD-10-CM

## 2019-04-05 NOTE — Research (Signed)
V11  Patient doing well, no complaitns of chest pains or shortness of breath.  Thanked him for participating in the study.    Current Outpatient Medications:  .  aspirin 81 MG chewable tablet, Chew 1 tablet (81 mg total) by mouth daily., Disp: 90 tablet, Rfl: 3 .  atorvastatin (LIPITOR) 80 MG tablet, TAKE 1 TABLET BY MOUTH DAILY AT 6:00 PM, Disp: 90 tablet, Rfl: 3 .  clopidogrel (PLAVIX) 75 MG tablet, Take 1 tablet (75 mg total) by mouth daily with breakfast., Disp: 90 tablet, Rfl: 3 .  metoprolol succinate (TOPROL-XL) 25 MG 24 hr tablet, TAKE 1/2 TABLET BY MOUTH DAILY, Disp: 45 tablet, Rfl: 3                                   "CONSENT"   YES     NO   Continuing further Investigational Product and study visits for follow-up? [x]  []   Continuing consent from future biomedical research [x]  []                                    "EVENTS"    YES     NO  AE   (IF YES SEE SOURCE) []  [x]   SAE  (IF YES SEE SOURCE) []  [x]   ENDPOINT   (IF YES SEE SOURCE) []  [x]   REVASCULARIZATION  (IF YES SEE SOURCE) []  [x]   AMPUTATION   (IF YES SEE SOURCE) []  [x]   TROPONIN'S  (IF YES SEE SOURCE) []  [x]    Lifestyle Adherence Assessment:   YES NO  Abstinence from smoking/remaining tobacco free X   Cardiac Diet X   Routine physical activity and/or cardiac rehabilitation X

## 2019-05-08 ENCOUNTER — Emergency Department (HOSPITAL_COMMUNITY): Payer: Medicare (Managed Care)

## 2019-05-08 ENCOUNTER — Emergency Department (HOSPITAL_COMMUNITY)
Admission: EM | Admit: 2019-05-08 | Discharge: 2019-05-08 | Disposition: A | Discharge status: Home / Self Care | Payer: Medicare (Managed Care) | Attending: Emergency Medicine | Admitting: Emergency Medicine

## 2019-05-08 ENCOUNTER — Other Ambulatory Visit: Payer: Self-pay

## 2019-05-08 DIAGNOSIS — Y92009 Unspecified place in unspecified non-institutional (private) residence as the place of occurrence of the external cause: Secondary | ICD-10-CM | POA: Insufficient documentation

## 2019-05-08 DIAGNOSIS — W108XXA Fall (on) (from) other stairs and steps, initial encounter: Secondary | ICD-10-CM | POA: Diagnosis not present

## 2019-05-08 DIAGNOSIS — I10 Essential (primary) hypertension: Secondary | ICD-10-CM | POA: Insufficient documentation

## 2019-05-08 DIAGNOSIS — Z951 Presence of aortocoronary bypass graft: Secondary | ICD-10-CM | POA: Insufficient documentation

## 2019-05-08 DIAGNOSIS — Z7982 Long term (current) use of aspirin: Secondary | ICD-10-CM | POA: Insufficient documentation

## 2019-05-08 DIAGNOSIS — W19XXXA Unspecified fall, initial encounter: Secondary | ICD-10-CM

## 2019-05-08 DIAGNOSIS — S43004A Unspecified dislocation of right shoulder joint, initial encounter: Secondary | ICD-10-CM | POA: Diagnosis not present

## 2019-05-08 DIAGNOSIS — Y9389 Activity, other specified: Secondary | ICD-10-CM | POA: Diagnosis not present

## 2019-05-08 DIAGNOSIS — Y999 Unspecified external cause status: Secondary | ICD-10-CM | POA: Insufficient documentation

## 2019-05-08 DIAGNOSIS — Z7902 Long term (current) use of antithrombotics/antiplatelets: Secondary | ICD-10-CM | POA: Diagnosis not present

## 2019-05-08 DIAGNOSIS — I2510 Atherosclerotic heart disease of native coronary artery without angina pectoris: Secondary | ICD-10-CM | POA: Diagnosis not present

## 2019-05-08 DIAGNOSIS — Z79899 Other long term (current) drug therapy: Secondary | ICD-10-CM | POA: Insufficient documentation

## 2019-05-08 DIAGNOSIS — S4991XA Unspecified injury of right shoulder and upper arm, initial encounter: Secondary | ICD-10-CM | POA: Diagnosis present

## 2019-05-08 MED ORDER — HYDROMORPHONE HCL 1 MG/ML IJ SOLN
0.5000 mg | Freq: Once | INTRAMUSCULAR | Status: AC
  Administered 2019-05-08: 0.5 mg via INTRAVENOUS
  Filled 2019-05-08: qty 1

## 2019-05-08 MED ORDER — HYDROCODONE-ACETAMINOPHEN 5-325 MG PO TABS: 1.0000 | ORAL_TABLET | ORAL | 0 refills | Status: DC | PRN

## 2019-05-08 MED ORDER — PROPOFOL 10 MG/ML IV BOLUS
INTRAVENOUS | Status: AC | PRN
  Administered 2019-05-08: 50 mg via INTRAVENOUS

## 2019-05-08 MED ORDER — PROPOFOL 10 MG/ML IV BOLUS
0.5000 mg/kg | Freq: Once | INTRAVENOUS | Status: AC
  Administered 2019-05-08: 43.8 mg via INTRAVENOUS
  Filled 2019-05-08: qty 20

## 2019-05-08 NOTE — Progress Notes (Signed)
Orthopedic Tech Progress Note Patient Details:  Steven Mathews 1945-12-14 [id number]  Ortho Devices Type of Ortho Device: Arm sling Ortho Device/Splint Location: rue. applied post reduction at drs request. Ortho Device/Splint Interventions: Ordered, Application, Adjustment   Post Interventions Patient Tolerated: Well Instructions Provided: Care of device, Adjustment of device   Karolee Stamps 05/08/2019, 10:51 PM

## 2019-05-08 NOTE — ED Provider Notes (Signed)
[facility] EMERGENCY DEPARTMENT Provider Note   CSN: [id number] Arrival date & time: 05/08/19  1915     History Chief Complaint  Patient presents with  . Shoulder Pain    Steven Mathews is a 74 y.o. male.  The history is provided by the patient and medical records. No language interpreter was used.  Shoulder Pain  Steven Mathews is a 74 y.o. male who presents to the Emergency Department complaining of fall.  He presents to the ED for evaluation of injuries following a fall.  He was at his son's house to feed the animals while his son was out of town.  He was on a landing and forgot there were stairs there and fell down two stairs, across another landing and fell onto an additional set of stairs and struck his right arm/shoulder.  He felt that his arm was disconnected.  He has severe pain in the right arm with numbness in the right hand.  Did not hit his head or pass out.  He was able to drive himself home.  He is right hand dominant.  Lives at home with wife.  Denies any chest pain, fever, sob, nausea, vomiting, recent illness. He does have some dizziness due to the pain.  He stopped taking plavix 4 days ago (ran out).      Past Medical History:  Diagnosis Date  . Atrial fibrillation ([place])   . CAD (coronary artery disease)    CABG 2008; 03/13/2018 NSTEMI due to acute thrombotic occlusion of RCA graft, patent LIMA to LAD.  Marland Kitchen Chronic cough   . GERD (gastroesophageal reflux disease)   . Hx of adenomatous colonic polyps 04/04/2016  . Hyperlipidemia   . Hypertension   . Myocardial infarct ([place])   . SVT (supraventricular tachycardia) [facility])     Patient Active Problem List   Diagnosis Date Noted  . Therapeutic drug monitoring 09/16/2018  . Rash 05/25/2018  . Essential hypertension 05/25/2018  . Educated about COVID-19 virus infection 05/25/2018  . Acute ST elevation myocardial infarction (STEMI) due to occlusion of right coronary artery ([place]) 03/13/2018  . Hx of CABG    . Hx of adenomatous colonic polyps 04/04/2016  . 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 ED (erectile dysfunction) 10/05/2010  . PALPITATIONS 08/29/2008  . BRONCHITIS, ACUTE 06/24/2008  . HYPERLIPIDEMIA 04/14/2008  . Acute ST elevation myocardial infarction (STEMI) ([place]) 04/14/2008  . PULMONARY INFILTRATE INCLUDES (EOSINOPHILIA) 04/14/2008  . Pure hypercholesterolemia 04/14/2008    Past Surgical History:  Procedure Laterality Date  . CORONARY ARTERY BYPASS GRAFT  03/2006  . LEFT HEART CATH AND CORS/GRAFTS ANGIOGRAPHY N/A 03/13/2018   Procedure: LEFT HEART CATH AND CORS/GRAFTS ANGIOGRAPHY;  Surgeon: Troy Sine, MD;  Location: [facility] CV LAB;  Service: Cardiovascular;  Laterality: N/A;  . SUPRAVENTRICULAR TACHYCARDIA ABLATION N/A 09/03/2012   Procedure: SUPRAVENTRICULAR TACHYCARDIA ABLATION;  Surgeon: Evans Lance, MD;  Location: [facility] C[facility] LAB;  Service: Cardiovascular;  Laterality: N/A;       Family History  Problem Relation Age of Onset  . Liver cancer Father   . Cancer Mother        bladder cancer  . Colon cancer Brother 4    Social History   Tobacco Use  . Smoking status: Never Smoker  . Smokeless tobacco: Never Used  Substance Use Topics  . Alcohol use: Yes    Alcohol/week: 7.0 standard drinks  Types: 7 Glasses of wine per week    Comment: occasional  . Drug use: No    Home Medications Prior to Admission medications   Medication Sig Start Date End Date Taking? Authorizing Provider  aspirin 81 MG chewable tablet Chew 1 tablet (81 mg total) by mouth daily. 03/17/18   Daune Perch, NP  atorvastatin (LIPITOR) 80 MG tablet TAKE 1 TABLET BY MOUTH DAILY AT 6:00 PM 03/23/19   Minus Breeding, MD  clopidogrel (PLAVIX) 75 MG tablet Take 1 tablet (75 mg total) by mouth daily with breakfast. 03/31/19   Daune Perch, NP  HYDROcodone-acetaminophen  (NORCO/VICODIN) 5-325 MG tablet Take 1 tablet by mouth every 4 (four) hours as needed. 05/08/19   Quintella Reichert, MD  metoprolol succinate (TOPROL-XL) 25 MG 24 hr tablet TAKE 1/2 TABLET BY MOUTH DAILY 03/23/19   Minus Breeding, MD    Allergies    Patient has no known allergies.  Review of Systems   Review of Systems  All other systems reviewed and are negative.   Physical Exam Updated Vital Signs BP (!) 157/77   Pulse 70   Temp 98.1 F (36.7 C) (Oral)   Resp 15   Ht 6' (1.829 m)   Wt 87.5 kg   SpO2 99%   BMI 26.18 kg/m   Physical Exam Vitals and nursing note reviewed.  Constitutional:      Appearance: He is well-developed.  HENT:     Head: Normocephalic and atraumatic.  Cardiovascular:     Rate and Rhythm: Normal rate and regular rhythm.     Heart sounds: No murmur.  Pulmonary:     Effort: Pulmonary effort is normal. No respiratory distress.     Breath sounds: Normal breath sounds.  Abdominal:     Palpations: Abdomen is soft.     Tenderness: There is no abdominal tenderness. There is no guarding or rebound.  Musculoskeletal:     Comments: 2+ radial pulses bilaterally.  TTP and deformity to right shoulder, right upper arm  Skin:    General: Skin is warm and dry.  Neurological:     Mental Status: He is alert and oriented to person, place, and time.     Comments: Sensation to light touch intact in the digits bilaterally.  Unable to extend at the wrist.    Psychiatric:        Behavior: Behavior normal.     ED Results / Procedures / Treatments   Labs (all labs ordered are listed, but only abnormal results are displayed) Labs Reviewed - No data to display  EKG EKG Interpretation  Date/Time:  Saturday May 08 2019 19:25:27 EDT Ventricular Rate:  69 PR Interval:    QRS Duration: 151 QT Interval:  426 QTC Calculation: 457 R Axis:   -10 Text Interpretation: Sinus rhythm Second deg AVB, Mobitz I (Wenckebach) Right bundle branch block Inferior infarct, old  Confirmed by Quintella Reichert [phone]) on 05/08/2019 11:07:57 PM   Radiology DG Chest 1 View  Result Date: 05/08/2019 CLINICAL DATA:  Pain status post fall EXAM: CHEST  1 VIEW COMPARISON:  03/13/2018 FINDINGS: There is a right-sided glenohumeral dislocation. The heart size is mildly enlarged. Aortic calcifications are noted. The patient is status post prior median sternotomy. There is no pneumothorax. No focal infiltrate. No convincing pleural effusion. There is some scarring versus atelectasis at the lung bases bilaterally. IMPRESSION: 1. No acute cardiopulmonary disease. 2. Right glenohumeral dislocation. 3. Mild cardiomegaly. Electronically Signed   By: Jamie Kato.D.  On: 05/08/2019 20:56   DG Shoulder 1 View Right  Result Date: 05/08/2019 CLINICAL DATA:  Status post reduction EXAM: RIGHT SHOULDER - 1 VIEW COMPARISON:  Film from earlier in the same day. FINDINGS: Humeral head has been reduced into the glenoid. No acute fracture is seen. IMPRESSION: Status post reduction. Electronically Signed   By: Inez Catalina M.D.   On: 05/08/2019 23:00   DG Shoulder Right  Result Date: 05/08/2019 CLINICAL DATA:  Pain status post fall EXAM: RIGHT SHOULDER - 2+ VIEW COMPARISON:  None. FINDINGS: There is an anterior inferior glenohumeral dislocation. There is a probable Hill-Sachs lesion. The osseous mineralization is slightly decreased. There are probable degenerative changes of the right AC and glenohumeral joints. IMPRESSION: Anterior inferior right glenohumeral dislocation with probable Hill-Sachs lesion. Electronically Signed   By: Constance Holster M.D.   On: 05/08/2019 20:54   DG Humerus Right  Result Date: 05/08/2019 CLINICAL DATA:  Pain status post fall EXAM: RIGHT HUMERUS - 2+ VIEW COMPARISON:  None. FINDINGS: There is an anterior inferior glenohumeral dislocation. There is a probable Hill-Sachs lesion. The osseous mineralization is slightly decreased. There are probable degenerative changes of the  right AC and glenohumeral joints. IMPRESSION: Right anterior inferior glenohumeral dislocation with probable Hill-Sachs lesion. Electronically Signed   By: Constance Holster M.D.   On: 05/08/2019 20:55    Procedures .Sedation  Date/Time: 05/08/2019 11:12 PM Performed by: Quintella Reichert, MD Authorized by: Quintella Reichert, MD   Consent:    Consent obtained:  Verbal   Consent given by:  Patient   Risks discussed:  Allergic reaction, dysrhythmia, inadequate sedation, nausea, prolonged hypoxia resulting in organ damage, prolonged sedation necessitating reversal, respiratory compromise necessitating ventilatory assistance and intubation and vomiting   Alternatives discussed:  Analgesia without sedation, anxiolysis and regional anesthesia Universal protocol:    Procedure explained and questions answered to patient or proxy's satisfaction: yes     Relevant documents present and verified: yes     Test results available and properly labeled: yes     Imaging studies available: yes     Required blood products, implants, devices, and special equipment available: yes     Site/side marked: yes     Immediately prior to procedure a time out was called: yes     Patient identity confirmation method:  Verbally with patient Indications:    Procedure performed:  Dislocation reduction   Procedure necessitating sedation performed by:  Physician performing sedation Pre-sedation assessment:    Time since last food or drink:  8   ASA classification: class 1 - normal, healthy patient     Neck mobility: normal     Mouth opening:  3 or more finger widths   Thyromental distance:  4 finger widths   Mallampati score:  I - soft palate, uvula, fauces, pillars visible   Pre-sedation assessments completed and reviewed: airway patency, cardiovascular function, hydration status, mental status, nausea/vomiting, pain level, respiratory function and temperature   Immediate pre-procedure details:    Reassessment: Patient  reassessed immediately prior to procedure     Reviewed: vital signs, relevant labs/tests and NPO status     Verified: bag valve mask available, emergency equipment available, intubation equipment available, IV patency confirmed, oxygen available and suction available   Procedure details (see MAR for exact dosages):    Preoxygenation:  Nasal cannula   Sedation:  Propofol   Intended level of sedation: deep   Intra-procedure monitoring:  Blood pressure monitoring, cardiac monitor, continuous pulse oximetry, frequent LOC  assessments, frequent vital sign checks and continuous capnometry   Intra-procedure events: none     Total Provider sedation time (minutes):  8 Post-procedure details:    Attendance: Constant attendance by certified staff until patient recovered     Recovery: Patient returned to pre-procedure baseline     Post-sedation assessments completed and reviewed: airway patency, cardiovascular function, hydration status, mental status, nausea/vomiting, pain level, respiratory function and temperature     Patient is stable for discharge or admission: yes     Patient tolerance:  Tolerated well, no immediate complications .Ortho Injury Treatment  Date/Time: 05/08/2019 11:13 PM Performed by: Quintella Reichert, MD Authorized by: Quintella Reichert, MD   Consent:    Consent obtained:  Verbal   Consent given by:  Patient   Risks discussed:  Fracture and irreducible dislocationInjury location: shoulder Location details: right shoulder Injury type: dislocation Hill-Sachs deformity: yes Chronicity: new Pre-procedure distal perfusion: normal Pre-procedure neurological function: diminished Pre-procedure neurological function comment: unable to extend wrist Pre-procedure range of motion: reduced  Anesthesia: Local anesthesia used: no Manipulation performed: yes Reduction successful: yes Immobilization: sling Post-procedure distal perfusion: normal Post-procedure neurological function:  diminished Post-procedure range of motion: improved    (including critical care time)  Medications Ordered in ED Medications  HYDROmorphone (DILAUDID) injection 0.5 mg (0.5 mg Intravenous Given 05/08/19 1953)  HYDROmorphone (DILAUDID) injection 0.5 mg (0.5 mg Intravenous Given 05/08/19 2157)  propofol (DIPRIVAN) 10 mg/mL bolus/IV push 43.8 mg (43.8 mg Intravenous Given 05/08/19 2251)  propofol (DIPRIVAN) 10 mg/mL bolus/IV push (50 mg Intravenous Given 05/08/19 2246)    ED Course  I have reviewed the triage vital signs and the nursing notes.  Pertinent labs & imaging results that were available during my care of the patient were reviewed by me and considered in my medical decision making (see chart for details).    MDM Rules/Calculators/A&P                     Patient here for evaluation of injuries following a mechanical fall downstairs. He is isolated pain and deformity to the right shoulder. He does have weakness of the right hand as well as paresthesias to the right hand with sensation intact, good distal pulses. Imaging is significant for a right shoulder dislocation. Discussed patient's neurologic examination with Dr. Marlou Sa with orthopedics, who recommends reduction with outpatient follow-up. Shoulder was reduced per note, patient tolerated the procedure well. Post reduction patient had persistent right radial pulses. He did have improved sensation and strength but continued to have ongoing weakness of extension of the hand. Discussed with patient home care following shoulder dislocation with importance of orthopedics follow-up.  Final Clinical Impression(s) / ED Diagnoses Final diagnoses:  Dislocation of right shoulder joint, initial encounter  Fall, initial encounter    Rx / DC Orders ED Discharge Orders         Ordered    HYDROcodone-acetaminophen (NORCO/VICODIN) 5-325 MG tablet  Every 4 hours PRN     05/08/19 2335           Quintella Reichert, MD 05/08/19 2351

## 2019-05-08 NOTE — ED Triage Notes (Signed)
Patient arrived by EMS from fall on steps hitting right shoulder. C/O right shoulder pain, numbness. EMS administered fentanyl 100 mcg. Stopped taking blood thinner 3-4 days ago

## 2019-05-12 ENCOUNTER — Ambulatory Visit: Payer: PRIVATE HEALTH INSURANCE | Admitting: Orthopedic Surgery

## 2019-05-19 ENCOUNTER — Telehealth: Payer: Self-pay

## 2019-05-19 NOTE — Telephone Encounter (Signed)
OK to hold Plavix but I would not want to hold ASA as well.

## 2019-05-19 NOTE — Telephone Encounter (Signed)
   [facility] Pre-operative Risk Assessment    Request for surgical clearance:  1. What type of surgery is being performed? Right shoulder scope, RCR  2. When is this surgery scheduled? 05/25/19  3. What type of clearance is required (medical clearance vs. Pharmacy clearance to hold med vs. Both)? Both  4. Are there any medications that need to be held prior to surgery and how long? ASA/ Plavix  5. Practice name and name of physician performing surgery? [facility]  6. What is your office phone number [phone]    7.   What is your office fax number [phone]  8.   Anesthesia type (None, local, MAC, general) ? General   Steven Mathews 05/19/2019, 9:17 AM  _________________________________________________________________   (provider comments below)

## 2019-05-19 NOTE — Telephone Encounter (Signed)
   Primary Cardiologist: Minus Breeding, MD  Left a voicemail for patient to call back for ongoing preoperative assessment.  Abigail Butts, PA-C 05/19/2019, 2:10 PM

## 2019-05-24 NOTE — Telephone Encounter (Signed)
   Primary Cardiologist: Minus Breeding, MD  Chart reviewed as part of pre-operative protocol coverage. Patient was contacted 05/24/2019 in reference to pre-operative risk assessment for pending surgery as outlined below.  Steven Mathews was last seen on 02/2019 by Dr. Percival Spanish.  Since that day, Steven Mathews has done well. He is been holding his plavix (restart post surgery) and advised to continue ASA as recommended,   Therefore, based on ACC/AHA guidelines, the patient would be at acceptable risk for the planned procedure without further cardiovascular testing.   I will route this recommendation to the requesting party via Epic fax function and remove from pre-op pool.  Please call with questions.  [place] Hall, [place] 05/24/2019, 4:23 PM

## 2019-06-25 ENCOUNTER — Other Ambulatory Visit: Payer: Self-pay | Admitting: *Deleted

## 2019-06-28 MED ORDER — ASPIRIN 81 MG PO CHEW: 81.0000 mg | CHEWABLE_TABLET | Freq: Every day | ORAL | 3 refills | Status: AC

## 2019-06-28 NOTE — Telephone Encounter (Signed)
Rx request sent to pharmacy.  

## 2020-03-28 ENCOUNTER — Other Ambulatory Visit: Payer: Self-pay | Admitting: Cardiology

## 2020-04-10 NOTE — Progress Notes (Signed)
Cardiology Office Note   Date:  04/12/2020   ID:  Steven Mathews, DOB 1945-09-12, MRN [id number]  PCP:  Cathlean Sauer, MD  Cardiologist:   Minus Breeding, MD  Chief Complaint  Patient presents with  . Fatigue      History of Present Illness: Steven Mathews is a 75 y.o. male with CAD s/p CABG. He presented to the hospital on 03/13/2018 with inferior STEMI. Cardiac catheterization performed on 03/13/2018 showed 40% ostial to proximal LAD lesion, 30% proximal LAD lesion, 55% proximal left circumflex lesion, 100% occlusion of SVG to distal RCA with extensive thrombus. He had a patent LIMA to mid LAD, chronically occluded SVG to obtuse marginal branch. EF 40 to 45%. Medical therapy was recommended. Echocardiogram obtained on 03/15/2018 showed EF 60 to 65%, moderately reduced RVEF, mild dilatation of the aortic root. 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 Beta-blocker was initially held, however later was reintroduced at a low dose.  Since I last saw him he fell and injured his shoulder.  He has not had syncope.  However, he has had fatigue and he thinks it was related to racing heart rate but in retrospect he really cannot tell whether it is slow heart rate he simply says it is skipping.  He does still stay active when he walks.  He golfs.  He has a fitness membership that he also has an elliptical.  He denies any chest pressure, neck or arm discomfort.  He has had no new shortness of breath, PND or orthopnea.  Of note I do note a resting tremor which he said has been going on for 3 to 6 months.  1. NSR with sinus bradycardia 2. Periods of AV WB and nocturnal high grade AV block are present 3. Brief episodes of SVT at 140-160/min 4. Accelerated junctional rhythm is also demonstrated    Past Medical History:  Diagnosis Date  . Atrial fibrillation ([place])   . CAD (coronary artery disease)    CABG 2008; 03/13/2018 NSTEMI  due to acute thrombotic occlusion of RCA graft, patent LIMA to LAD.  Marland Kitchen Chronic cough   . GERD (gastroesophageal reflux disease)   . Hx of adenomatous colonic polyps 04/04/2016  . Hyperlipidemia   . Hypertension   . Myocardial infarct ([place])   . SVT (supraventricular tachycardia) ([facility])     Past Surgical History:  Procedure Laterality Date  . CORONARY ARTERY BYPASS GRAFT  03/2006  . LEFT HEART CATH AND CORS/GRAFTS ANGIOGRAPHY N/A 03/13/2018   Procedure: LEFT HEART CATH AND CORS/GRAFTS ANGIOGRAPHY;  Surgeon: Troy Sine, MD;  Location: [facility] CV LAB;  Service: Cardiovascular;  Laterality: N/A;  . SUPRAVENTRICULAR TACHYCARDIA ABLATION N/A 09/03/2012   Procedure: SUPRAVENTRICULAR TACHYCARDIA ABLATION;  Surgeon: Evans Lance, MD;  Location: [facility] CATH LAB;  Service: Cardiovascular;  Laterality: N/A;     Current Outpatient Medications  Medication Sig Dispense Refill  . aspirin 81 MG chewable tablet Chew 1 tablet (81 mg total) by mouth daily. 90 tablet 3  . atorvastatin (LIPITOR) 80 MG tablet TAKE 1 TABLET BY MOUTH DAILY AT 6:00 PM 60 tablet 1  . clopidogrel (PLAVIX) 75 MG tablet Take 1 tablet (75 mg total) by mouth daily with breakfast. 90 tablet 3  . sildenafil (VIAGRA) 50 MG tablet Take 1 tablet (50 mg total) by mouth daily as needed for erectile dysfunction. 20 tablet 3   No current facility-administered medications for this visit.  Allergies:   Patient has no known allergies.    ROS:  Please see the history of present illness.   Otherwise, review of systems are positive for ED.   All other systems are reviewed and negative.    PHYSICAL EXAM: VS:  BP 125/74   Pulse (!) 53   Ht 6' (1.829 m)   Wt 202 lb 9.6 oz (91.9 kg)   SpO2 94%   BMI 27.48 kg/m  , BMI Body mass index is 27.48 kg/m. GENERAL:  Well appearing NECK:  No jugular venous distention, waveform within normal limits, carotid upstroke brisk and symmetric, no bruits, no thyromegaly LUNGS:  Clear to auscultation  bilaterally CHEST:  Unremarkable HEART:  PMI not displaced or sustained,S1 and S2 within normal limits, no S3, no S4, no clicks, no rubs, no murmurs ABD:  Flat, positive bowel sounds normal in frequency in pitch, no bruits, no rebound, no guarding, no midline pulsatile mass, no hepatomegaly, no splenomegaly EXT:  2 plus pulses throughout, no edema, no cyanosis no clubbing NEURO:  Resting tremor.    EKG:  EKG is  ordered today. Sinus rhythm, rate 53, right bundle branch block, no acute ST-T wave.  Mobitz type II heart block  Recent Labs: No results found for requested labs within last 8760 hours.    Lipid Panel    Component Value Date/Time   CHOL 135 09/16/2018 0939   TRIG 109 09/16/2018 0939   HDL 40 09/16/2018 0939   CHOLHDL 3.4 09/16/2018 0939   CHOLHDL 4.2 03/13/2018 2055   VLDL 14 03/13/2018 2055   LDLCALC 73 09/16/2018 0939   LDLDIRECT 114.1 06/16/2012 1032      Wt Readings from Last 3 Encounters:  04/12/20 202 lb 9.6 oz (91.9 kg)  05/08/19 193 lb (87.5 kg)  03/03/19 201 lb 3.2 oz (91.3 kg)      Other studies Reviewed: Additional studies/ records that were reviewed today include: None Review of the above records demonstrates:  NA   ASSESSMENT AND PLAN:  CADs/p CABG:     The patient has no new sypmtoms.  No further cardiovascular testing is indicated.  We will continue with aggressive risk reduction and meds as listed.  Hypertension:The blood pressure is at target.  No change in therapy  Hyperlipidemia:   LDL was at target but has not been checked-he will come back for fasting lipid profile and other labs.   Tremor: I am going to refer him to neurology.  Bradycardia: I am concerned that he has symptomatic bradycardia arrhythmias now.  He certainly has high degree conduction block.  I think he is going to probably need a pacemaker but admitted to a 3-day monitor and most likely refer him back to Dr. Lovena Le.  I will stop his low-dose beta-blocker.  Of note  he did have some runs of SVT and some Mobitz type II mostly at sleep a few years ago.  We decided to manage this expectantly as he had no symptoms.  ED: I will prescribe Viagra as needed.   Current medicines are reviewed at length with the patient today.  The patient does not have concerns regarding medicines.  The following changes have been made:  As above.    Labs/ tests ordered today include:       Orders Placed This Encounter  Procedures  . Lipid panel  . Hepatic function panel  . CBC  . Comprehensive Metabolic Panel (CMET)  . Ambulatory referral to Neurology  . LONG TERM MONITOR (3-14  DAYS)  . EKG 12-Lead     Disposition:   FU with me in 3 months.    Signed, Minus Breeding, MD  04/12/2020 9:28 AM    [facility]

## 2020-04-12 ENCOUNTER — Encounter: Payer: Self-pay | Admitting: Radiology

## 2020-04-12 ENCOUNTER — Other Ambulatory Visit: Payer: Self-pay

## 2020-04-12 ENCOUNTER — Ambulatory Visit (INDEPENDENT_AMBULATORY_CARE_PROVIDER_SITE_OTHER): Payer: Medicare (Managed Care)

## 2020-04-12 ENCOUNTER — Ambulatory Visit (INDEPENDENT_AMBULATORY_CARE_PROVIDER_SITE_OTHER): Payer: Medicare (Managed Care) | Admitting: Cardiology

## 2020-04-12 ENCOUNTER — Encounter: Payer: Self-pay | Admitting: Cardiology

## 2020-04-12 VITALS — BP 125/74 | HR 53 | Ht 72.0 in | Wt 202.6 lb

## 2020-04-12 DIAGNOSIS — I2581 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: Secondary | ICD-10-CM

## 2020-04-12 DIAGNOSIS — I498 Other specified cardiac arrhythmias: Secondary | ICD-10-CM

## 2020-04-12 DIAGNOSIS — I10 Essential (primary) hypertension: Secondary | ICD-10-CM | POA: Diagnosis not present

## 2020-04-12 DIAGNOSIS — Z79899 Other long term (current) drug therapy: Secondary | ICD-10-CM

## 2020-04-12 DIAGNOSIS — E785 Hyperlipidemia, unspecified: Secondary | ICD-10-CM

## 2020-04-12 DIAGNOSIS — G252 Other specified forms of tremor: Secondary | ICD-10-CM

## 2020-04-12 MED ORDER — SILDENAFIL CITRATE 50 MG PO TABS: 50.0000 mg | ORAL_TABLET | Freq: Every day | ORAL | 3 refills | Status: DC | PRN

## 2020-04-12 NOTE — Progress Notes (Signed)
Enrolled patient for a 3 day Zio XT Monitor to be mailed to patients home.  °

## 2020-04-12 NOTE — Patient Instructions (Signed)
Medication Instructions:  STOP- Metoprolol Succinate  *If you need a refill on your cardiac medications before your next appointment, please call your pharmacy*   Lab Work: Fasting Lipids, CMP, CBC and Liver function  If you have labs (blood work) drawn today and your tests are completely normal, you will receive your results only by: Marland Kitchen MyChart Message (if you have MyChart) OR . A paper copy in the mail If you have any lab test that is abnormal or we need to change your treatment, we will call you to review the results.   Testing/Procedures: Your physician has recommended that you wear a 3 day Zio monitor. Holter monitors are medical devices that record the heart's electrical activity. Doctors most often use these monitors to diagnose arrhythmias. Arrhythmias are problems with the speed or rhythm of the heartbeat. The monitor is a small, portable device. You can wear one while you do your normal daily activities. This is usually used to diagnose what is causing palpitations/syncope (passing out).   Follow-Up: At [facility], you and your health needs are our priority.  As part of our continuing mission to provide you with exceptional heart care, we have created designated Provider Care Teams.  These Care Teams include your primary Cardiologist (physician) and Advanced Practice Providers (APPs -  Physician Assistants and Nurse Practitioners) who all work together to provide you with the care you need, when you need it.  We recommend signing up for the patient portal called "MyChart".  Sign up information is provided on this After Visit Summary.  MyChart is used to connect with patients for Virtual Visits (Telemedicine).  Patients are able to view lab/test results, encounter notes, upcoming appointments, etc.  Non-urgent messages can be sent to your provider as well.   To learn more about what you can do with MyChart, go to [email or web address].    Your next appointment:   3  month(s)  The format for your next appointment:   In Person  Provider:   You may see Minus Breeding, MD or one of the following Advanced Practice Providers on your designated Care Team:    Rosaria Ferries, PA-C  Jory Sims, DNP, ANP    Other Instructions You have been referred to Neurologist  [place] Monitor Instructions   Your physician has requested you wear your ZIO patch monitor 3 days.   This is a single patch monitor.  [employer] supplies one patch monitor per enrollment.  Additional stickers are not available.   Please do not apply patch if you will be having a Nuclear Stress Test, Echocardiogram, Cardiac CT, MRI, or Chest Xray during the time frame you would be wearing the monitor. The patch cannot be worn during these tests.  You cannot remove and re-apply the ZIO XT patch monitor.   Your ZIO patch monitor will be sent USPS Priority mail from [facility] directly to your home address. The monitor may also be mailed to a PO BOX if home delivery is not available.   It may take 3-5 days to receive your monitor after you have been enrolled.   Once you have received you monitor, please review enclosed instructions.  Your monitor has already been registered assigning a specific monitor serial # to you.   Applying the monitor   Shave hair from upper left chest.   Hold abrader disc by orange tab.  Rub abrader in 40 strokes over left upper chest as indicated in your monitor instructions.   Clean area with  4 enclosed alcohol pads .  Use all pads to assure are is cleaned thoroughly.  Let dry.   Apply patch as indicated in monitor instructions.  Patch will be place under collarbone on left side of chest with arrow pointing upward.   Rub patch adhesive wings for 2 minutes.Remove white label marked "1".  Remove white label marked "2".  Rub patch adhesive wings for 2 additional minutes.   While looking in a mirror, press and release button in center of patch.   A small green light will flash 3-4 times .  This will be your only indicator the monitor has been turned on.     Do not shower for the first 24 hours.  You may shower after the first 24 hours.   Press button if you feel a symptom. You will hear a small click.  Record Date, Time and Symptom in the Patient Log Book.   When you are ready to remove patch, follow instructions on last 2 pages of Patient Log Book.  Stick patch monitor onto last page of Patient Log Book.   Place Patient Log Book in [place] box.  Use locking tab on box and tape box closed securely.  The [employer] has [employer] on it.  Please place in mailbox as soon as possible.  Your physician should have your test results approximately 7 days after the monitor has been mailed back to [facility].   Call [place] at [phone] if you have questions regarding your ZIO XT patch monitor.  Call them immediately if you see an orange light blinking on your monitor.   If your monitor falls off in less than 4 days contact our Monitor department at [phone].  If your monitor becomes loose or falls off after 4 days call [employer] at [phone] for suggestions on securing your monitor.

## 2020-04-18 NOTE — Progress Notes (Signed)
Assessment/Plan:   1.    Essential Tremor.  -This is evidenced by the symmetrical nature and longstanding hx of gradually getting worse.  We discussed nature and pathophysiology.  We discussed that this can continue to gradually get worse with time.  We discussed that some medications can worsen this, as can caffeine use.  We discussed medication therapy as well as surgical therapy.  Ultimately, the patient decided to hold meds.  I think that risks of meds outweigh benefits given that just taken off of beta blocker for bradycardia.  Don't think that tremor is bothersome enough to start primidone and he agrees.    -Patient needs lab work.  Last labs from what I can tell her from February, 2020.  Does not appear that patient has seen his primary care physician since that time.  However, pt pointed out that Dr. Percival Spanish just ordered it and he needs to get it done so I will just add a TSH to that.  -Reassured patient that he does not have Parkinson's disease, at least at this point.  Does not meet [place] brain bank or modified MDS criteria for this diagnosis.  2.  Follow-up as needed.  Subjective:   Steven Mathews was seen today in the movement disorders clinic for neurologic consultation at the request of Minus Breeding, MD.  The consultation is for the evaluation of rest tremor.   Specific Symptoms:  Tremor: Yes.   x 3-4 years per pt, started in the R hand but about a year ago it involves the L hand.  Mostly with activation - holding coffee/wine.  Drinks 2 cups per day - doesn't think that it changes tremor.  Drinks 2-3 glasses wine/night - no change in tremor.  No change with anxiety/stress.  No problems with shaving but uses electric.   Family hx of similar:  No. Voice: no change Sleep: sleeps well  Vivid Dreams: some  Acting out dreams:  No. Wet Pillows: No. Postural symptoms:  Yes.  , some change  Falls?  Yes.   , Had a fall and dislocated his right shoulder in April, 2021.  Emergency room  records reviewed.  Patient was at his son's house and fell down the stairs.  No other falls Bradykinesia symptoms: no bradykinesia noted Loss of smell:  No. Loss of taste:  No. Urinary Incontinence:  No. Difficulty Swallowing:  Some GERD  Handwriting, micrographia: No. Trouble with ADL's:  No.  Trouble buttoning clothing: No. Depression:  No. Memory changes:  Some trouble - good with finances/business.  Some trouble with names.   Hallucinations:  No.  visual distortions: No. N/V:  No. Syncope: No.  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  He just stopped his beta-blocker. Diplopia:  No. Dyskinesia:  No. Prior exposure to reglan/antipsychotics: No.  Neuroimaging of the brain has not previously been performed.     ALLERGIES:  No Known Allergies  CURRENT MEDICATIONS:  Current Outpatient Medications  Medication Instructions  . aspirin 81 mg, Oral, Daily  . atorvastatin (LIPITOR) 80 MG tablet TAKE 1 TABLET BY MOUTH DAILY AT 6:00 PM  . clopidogrel (PLAVIX) 75 mg, Oral, Daily with breakfast  . sildenafil (VIAGRA) 50 mg, Oral, Daily PRN    Objective:   VITALS:   Vitals:   04/20/20 1336  BP: 131/80  Pulse: 90  Resp: 20  SpO2: 97%  Weight: 198 lb (89.8 kg)  Height: 6' (  1.829 m)    GEN:  The patient appears stated age and is in NAD. HEENT:  Normocephalic, atraumatic.  The mucous membranes are moist. The superficial temporal arteries are without ropiness or tenderness. CV:  RRR Lungs:  CTAB Neck/HEME:  There are no carotid bruits bilaterally.  Neurological examination:  Orientation: The patient is alert and oriented x3.  Cranial nerves: There is good facial symmetry. Extraocular muscles are intact. The visual fields are full to confrontational testing. The speech is fluent and clear. Soft palate rises symmetrically and there is no tongue deviation. Hearing is intact to conversational  tone. Sensation: Sensation is intact to light and pinprick throughout (facial, trunk, extremities). Vibration is intact at the bilateral big toe. There is no extinction with double simultaneous stimulation. There is no sensory dermatomal level identified. Motor: Strength is 5/5 in the bilateral upper and lower extremities.   Shoulder shrug is equal and symmetric.  There is no pronator drift. Deep tendon reflexes: Deep tendon reflexes are 2/4 at the bilateral biceps, triceps, brachioradialis, patella and achilles. Plantar responses are downgoing bilaterally.  Movement examination: Tone: There is normal tone in the bilateral upper extremities.  The tone in the lower extremities is normal.  Abnormal movements: No rest tremor.  He does have mild intention tremor, right greater than left.  Mild evidence of tremor with Archimedes spirals on the right.  He does fairly well on the left.  Tremor is evident when he pours water from one glass to another, but he does not spill the water. Coordination:  There is no decremation with RAM's, with any form of RAMS, including alternating supination and pronation of the forearm, hand opening and closing, finger taps, heel taps and toe taps. Gait and Station: The patient has no difficulty arising out of a deep-seated chair without the use of the hands. The patient's stride length is good.   I have reviewed and interpreted the following labs independently   Chemistry      Component Value Date/Time   NA 137 03/16/2018 0237   K 3.9 03/16/2018 0237   CL 103 03/16/2018 0237   CO2 24 03/16/2018 0237   BUN 13 03/16/2018 0237   CREATININE 1.27 (H) 03/16/2018 0237      Component Value Date/Time   CALCIUM 8.8 (L) 03/16/2018 0237   ALKPHOS 48 03/13/2018 2055   AST 47 (H) 03/13/2018 2055   ALT 53 (H) 03/13/2018 2055   BILITOT 0.4 03/13/2018 2055      Lab Results  Component Value Date   TSH 3.575 10/02/2015   Lab Results  Component Value Date   WBC 12.8 (H)  03/16/2018   HGB 12.0 (L) 03/16/2018   HCT 36.2 (L) 03/16/2018   MCV 93.8 03/16/2018   PLT 173 03/16/2018   (Last notes and labs from primary medicine clinic from February, 2020)  Total time spent on today's visit was 45 minutes, including both face-to-face time and nonface-to-face time.  Time included that spent on review of records (prior notes available to me/labs/imaging if pertinent), discussing treatment and goals, answering patient's questions and coordinating care.  Cc:  Cathlean Sauer, MD

## 2020-04-19 DIAGNOSIS — I498 Other specified cardiac arrhythmias: Secondary | ICD-10-CM

## 2020-04-20 ENCOUNTER — Encounter: Payer: Self-pay | Admitting: Neurology

## 2020-04-20 ENCOUNTER — Ambulatory Visit (INDEPENDENT_AMBULATORY_CARE_PROVIDER_SITE_OTHER): Payer: Medicare (Managed Care) | Admitting: Neurology

## 2020-04-20 ENCOUNTER — Other Ambulatory Visit: Payer: Self-pay

## 2020-04-20 VITALS — BP 131/80 | HR 90 | Resp 20 | Ht 72.0 in | Wt 198.0 lb

## 2020-04-20 DIAGNOSIS — G250 Essential tremor: Secondary | ICD-10-CM | POA: Diagnosis not present

## 2020-04-20 DIAGNOSIS — R251 Tremor, unspecified: Secondary | ICD-10-CM | POA: Diagnosis not present

## 2020-04-20 NOTE — Patient Instructions (Signed)

## 2020-04-25 ENCOUNTER — Other Ambulatory Visit: Payer: Self-pay

## 2020-04-25 ENCOUNTER — Other Ambulatory Visit (INDEPENDENT_AMBULATORY_CARE_PROVIDER_SITE_OTHER): Payer: Medicare (Managed Care)

## 2020-04-25 DIAGNOSIS — R251 Tremor, unspecified: Secondary | ICD-10-CM

## 2020-04-25 LAB — LIPID PANEL
Chol/HDL Ratio: 3.4 ratio (ref 0.0–5.0)
Cholesterol, Total: 158 mg/dL (ref 100–199)
HDL: 47 mg/dL (ref >39)
LDL Chol Calc (NIH): 86 mg/dL (ref 0–99)
Triglycerides: 142 mg/dL (ref 0–149)
VLDL Cholesterol Cal: 25 mg/dL (ref 5–40)

## 2020-04-25 LAB — COMPREHENSIVE METABOLIC PANEL
ALT: 20 IU/L (ref 0–44)
AST: 21 IU/L (ref 0–40)
Albumin/Globulin Ratio: 1.6 (ref 1.2–2.2)
Albumin: 4.2 g/dL (ref 3.7–4.7)
Alkaline Phosphatase: 60 IU/L (ref 44–121)
BUN/Creatinine Ratio: 18 (ref 10–24)
BUN: 18 mg/dL (ref 8–27)
Bilirubin Total: 0.4 mg/dL (ref 0.0–1.2)
CO2: 20 mmol/L (ref 20–29)
Calcium: 9.4 mg/dL (ref 8.6–10.2)
Chloride: 103 mmol/L (ref 96–106)
Creatinine, Ser: 1.02 mg/dL (ref 0.76–1.27)
Globulin, Total: 2.6 g/dL (ref 1.5–4.5)
Glucose: 100 mg/dL — ABNORMAL HIGH (ref 65–99)
Potassium: 4.5 mmol/L (ref 3.5–5.2)
Sodium: 138 mmol/L (ref 134–144)
Total Protein: 6.8 g/dL (ref 6.0–8.5)
eGFR: 77 mL/min/{1.73_m2} (ref >59)

## 2020-04-25 LAB — CBC
Hematocrit: 40.4 % (ref 37.5–51.0)
Hemoglobin: 13.8 g/dL (ref 13.0–17.7)
MCH: 31.9 pg (ref 26.6–33.0)
MCHC: 34.2 g/dL (ref 31.5–35.7)
MCV: 94 fL (ref 79–97)
Platelets: 248 10*3/uL (ref 150–450)
RBC: 4.32 x10E6/uL (ref 4.14–5.80)
RDW: 12.9 % (ref 11.6–15.4)
WBC: 6.5 10*3/uL (ref 3.4–10.8)

## 2020-04-25 LAB — HEPATIC FUNCTION PANEL: Bilirubin, Direct: 0.13 mg/dL (ref 0.00–0.40)

## 2020-04-25 LAB — TSH: TSH: 3.41 u[IU]/mL (ref 0.35–4.50)

## 2020-05-03 ENCOUNTER — Telehealth: Payer: Self-pay | Admitting: *Deleted

## 2020-05-03 DIAGNOSIS — Z5181 Encounter for therapeutic drug level monitoring: Secondary | ICD-10-CM

## 2020-05-03 DIAGNOSIS — I10 Essential (primary) hypertension: Secondary | ICD-10-CM

## 2020-05-03 DIAGNOSIS — E785 Hyperlipidemia, unspecified: Secondary | ICD-10-CM

## 2020-05-03 MED ORDER — ROSUVASTATIN CALCIUM 40 MG PO TABS: 40.0000 mg | ORAL_TABLET | Freq: Every day | ORAL | 3 refills | Status: DC

## 2020-05-03 NOTE — Telephone Encounter (Signed)
Advised patient of lab results, new medication sent to pharmacy and mailed lab orders

## 2020-05-03 NOTE — Telephone Encounter (Signed)
-----   Message from Minus Breeding, MD sent at 04/29/2020 10:51 AM EDT ----- LDL is mildly elevated and not quite at target.  I would like to change him from Lipitor to Crestor 40 mg PO daily.  Repeat a lipid and liver profile in 10 weeks.  Call Mr. Werts with the results and send results to Cathlean Sauer, MD

## 2020-05-30 ENCOUNTER — Telehealth: Payer: Self-pay | Admitting: Cardiology

## 2020-05-30 NOTE — Telephone Encounter (Signed)
Pt is returning a call from Thursday  05/25/20. Pt is not sure what this phone call was in regards too! Please advise

## 2020-05-30 NOTE — Telephone Encounter (Signed)
Spoke to patient stated he was calling back for monitor results.Monitor results given.Dr.Hochrein advised to see Dr.Taylor.EP scheduler called left message to call patient with appointment.

## 2020-06-06 NOTE — Telephone Encounter (Signed)
Appointment scheduled with Dr.Taylor 06/13/20 at 8:30 am.

## 2020-06-13 ENCOUNTER — Other Ambulatory Visit: Payer: Self-pay

## 2020-06-13 ENCOUNTER — Encounter: Payer: Self-pay | Admitting: Internal Medicine

## 2020-06-13 ENCOUNTER — Ambulatory Visit (INDEPENDENT_AMBULATORY_CARE_PROVIDER_SITE_OTHER): Payer: Medicare (Managed Care) | Admitting: Internal Medicine

## 2020-06-13 VITALS — BP 136/74 | HR 71 | Ht 72.0 in | Wt 200.6 lb

## 2020-06-13 DIAGNOSIS — I459 Conduction disorder, unspecified: Secondary | ICD-10-CM | POA: Diagnosis not present

## 2020-06-13 DIAGNOSIS — I471 Supraventricular tachycardia: Secondary | ICD-10-CM | POA: Diagnosis not present

## 2020-06-13 LAB — BASIC METABOLIC PANEL
BUN/Creatinine Ratio: 14 (ref 10–24)
BUN: 16 mg/dL (ref 8–27)
CO2: 24 mmol/L (ref 20–29)
Calcium: 10 mg/dL (ref 8.6–10.2)
Chloride: 103 mmol/L (ref 96–106)
Creatinine, Ser: 1.12 mg/dL (ref 0.76–1.27)
Glucose: 99 mg/dL (ref 65–99)
Potassium: 5 mmol/L (ref 3.5–5.2)
Sodium: 140 mmol/L (ref 134–144)
eGFR: 69 mL/min/{1.73_m2} (ref >59)

## 2020-06-13 LAB — CBC WITH DIFFERENTIAL/PLATELET
Basophils Absolute: 0.1 10*3/uL (ref 0.0–0.2)
Basos: 1 %
EOS (ABSOLUTE): 0.3 10*3/uL (ref 0.0–0.4)
Eos: 5 %
Hematocrit: 43 % (ref 37.5–51.0)
Hemoglobin: 15.1 g/dL (ref 13.0–17.7)
Immature Grans (Abs): 0 10*3/uL (ref 0.0–0.1)
Immature Granulocytes: 0 %
Lymphocytes Absolute: 2.3 10*3/uL (ref 0.7–3.1)
Lymphs: 34 %
MCH: 33.1 pg — ABNORMAL HIGH (ref 26.6–33.0)
MCHC: 35.1 g/dL (ref 31.5–35.7)
MCV: 94 fL (ref 79–97)
Monocytes Absolute: 0.6 10*3/uL (ref 0.1–0.9)
Monocytes: 8 %
Neutrophils Absolute: 3.5 10*3/uL (ref 1.4–7.0)
Neutrophils: 52 %
Platelets: 264 10*3/uL (ref 150–450)
RBC: 4.56 x10E6/uL (ref 4.14–5.80)
RDW: 12.6 % (ref 11.6–15.4)
WBC: 6.8 10*3/uL (ref 3.4–10.8)

## 2020-06-13 NOTE — Patient Instructions (Addendum)
Medication Instructions:  Your physician recommends that you continue on your current medications as directed. Please refer to the Current Medication list given to you today.  Labwork: You will get lab work today:  BMP and CBC  Testing/Procedures: None ordered.  Follow-Up:  SEE INSTRUCTION LETTER   Pacemaker Implantation, Adult Pacemaker implantation is a procedure to place a pacemaker inside the chest. A pacemaker is a small computer that sends electrical signals to the heart and helps the heart beat normally. A pacemaker also stores information about heart rhythms. You may need pacemaker implantation if you have:  A slow heartbeat (bradycardia).  Loss of consciousness that happens repeatedly (syncope) or repeated episodes of dizziness or light-headedness because of an irregular heart rate.  Shortness of breath (dyspnea) due to heart problems. The pacemaker usually attaches to your heart through a wire called a lead. One or two leads may be needed. There are different types of pacemakers:  Transvenous pacemaker. This type is placed under the skin or muscle of your upper chest area. The lead goes through a vein in the chest area to reach the inside of the heart.  Epicardial pacemaker. This type is placed under the skin or muscle of your chest or abdomen. The lead goes through your chest to the outside of the heart. Tell a health care provider about:  Any allergies you have.  All medicines you are taking, including vitamins, herbs, eye drops, creams, and over-the-counter medicines.  Any problems you or family members have had with anesthetic medicines.  Any blood or bone disorders you have.  Any surgeries you have had.  Any medical conditions you have.  Whether you are pregnant or may be pregnant. What are the risks? Generally, this is a safe procedure. However, problems may occur, including:  Infection.  Bleeding.  Failure of the pacemaker or the lead.  Collapse of  a lung or bleeding into a lung.  Blood clot inside a blood vessel with a lead.  Damage to the heart.  Infection inside the heart (endocarditis).  Allergic reactions to medicines. What happens before the procedure? Staying hydrated Follow instructions from your health care provider about hydration, which may include:  Up to 2 hours before the procedure - you may continue to drink clear liquids, such as water, clear fruit juice, black coffee, and plain tea.   Eating and drinking restrictions Follow instructions from your health care provider about eating and drinking, which may include:  8 hours before the procedure - stop eating heavy meals or foods, such as meat, fried foods, or fatty foods.  6 hours before the procedure - stop eating light meals or foods, such as toast or cereal.  6 hours before the procedure - stop drinking milk or drinks that contain milk.  2 hours before the procedure - stop drinking clear liquids. Medicines Ask your health care provider about:  Changing or stopping your regular medicines. This is especially important if you are taking diabetes medicines or blood thinners.  Taking medicines such as aspirin and ibuprofen. These medicines can thin your blood. Do not take these medicines unless your health care provider tells you to take them.  Taking over-the-counter medicines, vitamins, herbs, and supplements. Tests You may have:  A heart evaluation. This may include: ? An electrocardiogram (ECG). This involves placing patches on your skin to check your heart rhythm. ? A chest X-ray. ? An echocardiogram. This is a test that uses sound waves (ultrasound) to produce an image of the heart. ?  A cardiac rhythm monitor. This is used to record your heart rhythm and any events for a longer period of time.  Blood tests.  Genetic testing. General instructions  Do not use any products that contain nicotine or tobacco for at least 4 weeks before the procedure.  These products include cigarettes, e-cigarettes, and chewing tobacco. If you need help quitting, ask your health care provider.  Ask your health care provider: ? How your surgery site will be marked. ? What steps will be taken to help prevent infection. These steps may include:  Removing hair at the surgery site.  Washing skin with a germ-killing soap.  Receiving antibiotic medicine.  Plan to have someone take you home from the hospital or clinic.  If you will be going home right after the procedure, plan to have someone with you for 24 hours. What happens during the procedure?  An IV will be inserted into one of your veins.  You will be given one or more of the following: ? A medicine to help you relax (sedative). ? A medicine to numb the area (local anesthetic). ? A medicine to make you fall asleep (general anesthetic).  The next steps vary depending on the type of pacemaker you will be getting. ? If you are getting a transvenous pacemaker:  An incision will be made in your upper chest.  A pocket will be made for the pacemaker. It may be placed under the skin or between layers of muscle.  The lead will be inserted into a blood vessel that goes to the heart.  While X-rays are taken by an imaging machine (fluoroscopy), the lead will be advanced through the vein to the inside of your heart.  The other end of the lead will be tunneled under the skin and attached to the pacemaker. ? If you are getting an epicardial pacemaker:  An incision will be made near your ribs or breastbone (sternum) for the lead.  The lead will be attached to the outside of your heart.  Another incision will be made in your chest or upper abdomen to create a pocket for the pacemaker.  The free end of the lead will be tunneled under the skin and attached to the pacemaker.  The transvenous or epicardial pacemaker will be tested. Imaging studies may be done to check the lead position.  The incisions  will be closed with stitches (sutures), adhesive strips, or skin glue.  Bandages (dressings) will be placed over the incisions. The procedure may vary among health care providers and hospitals. What happens after the procedure?  Your blood pressure, heart rate, breathing rate, and blood oxygen level will be monitored until you leave the hospital or clinic.  You may be given antibiotics.  You will be given pain medicine.  An ECG and chest X-rays will be done.  You may need to wear a continuous type of ECG (Holter monitor) to check your heart rhythm.  Your health care provider will program the pacemaker.  If you were given a sedative during the procedure, it can affect you for several hours. Do not drive or operate machinery until your health care provider says that it is safe.  You will be given a pacemaker identification card. This card lists the implant date, device model, and manufacturer of your pacemaker. Summary  A pacemaker is a small computer that sends electrical signals to the heart and helps the heart beat normally.  There are different types of pacemakers. A pacemaker may be placed  under the skin or muscle of your chest or abdomen.  Follow instructions from your health care provider about eating and drinking and about taking medicines before the procedure. This information is not intended to replace advice given to you by your health care provider. Make sure you discuss any questions you have with your health care provider. Document Revised: 12/23/2018 Document Reviewed: 12/23/2018 Elsevier Patient Education  2021 [employer].

## 2020-06-13 NOTE — H&P (View-Only) (Signed)
HPI: [name] returns today for followup. He is a pleasant 75 yo man with CAD, s/p CABG, HTN, and dyslipidemia. He had SVT and underwent ablation but had recurrence. 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 He remains symptomatic. He has not passed out and no near syncope. I have not seen him in almost 3 years. 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  He has not had syncope.   No Known Allergies   Current Outpatient Medications  Medication Sig Dispense Refill  . aspirin 81 MG chewable tablet Chew 1 tablet (81 mg total) by mouth daily. 90 tablet 3  . clopidogrel (PLAVIX) 75 MG tablet Take 1 tablet (75 mg total) by mouth daily with breakfast. 90 tablet 3  . rosuvastatin (CRESTOR) 40 MG tablet Take 1 tablet (40 mg total) by mouth daily. 90 tablet 3  . sildenafil (VIAGRA) 50 MG tablet Take 1 tablet (50 mg total) by mouth daily as needed for erectile dysfunction. 20 tablet 3   No current facility-administered medications for this visit.     Past Medical History:  Diagnosis Date  . Atrial fibrillation ([place])   . CAD (coronary artery disease)    CABG 2008; 03/13/2018 NSTEMI due to acute thrombotic occlusion of RCA graft, patent LIMA to LAD.  Marland Kitchen Chronic cough   . GERD (gastroesophageal reflux disease)   . Hx of adenomatous colonic polyps 04/04/2016  . Hyperlipidemia   . Hypertension   . Myocardial infarct ([place])   . SVT (supraventricular tachycardia) ([facility])     ROS:   All systems reviewed and negative except as noted in the HPI.   Past Surgical History:  Procedure Laterality Date  . CORONARY ARTERY BYPASS GRAFT  03/2006  . LEFT HEART CATH AND CORS/GRAFTS ANGIOGRAPHY N/A 03/13/2018   Procedure: LEFT HEART CATH AND CORS/GRAFTS ANGIOGRAPHY;  Surgeon: Troy Sine, MD;  Location: [facility];  Service: Cardiovascular;  Laterality: N/A;  . SUPRAVENTRICULAR TACHYCARDIA ABLATION N/A 09/03/2012   Procedure: SUPRAVENTRICULAR TACHYCARDIA ABLATION;  Surgeon: Evans Lance, MD;  Location: [facility] CATH LAB;  Service: Cardiovascular;  Laterality: N/A;     Family History  Problem Relation Age of Onset  . Liver cancer Father   . Cancer Mother        bladder cancer  . Colon cancer Brother 39     Social History   Socioeconomic History  . Marital status: Married    Spouse name: Not on file  . Number of children: Not on file  . Years of education: Not on file  . Highest education level: Not on file  Occupational History  . Not on file  Tobacco Use  . Smoking status: Never Smoker  . Smokeless tobacco: Never Used  Vaping Use  . Vaping Use: Never used  Substance and Sexual Activity  . Alcohol use: Yes    Comment: 2-3 glasses wine/day  . Drug use: No  . Sexual activity: Not on file  Other Topics Concern  . Not on file  Social History Narrative   Right handed   Two story home   Drinks caffeine   Social Determinants of Health   Financial Resource Strain: Not on file  Food Insecurity: Not on file  Transportation Needs: Not on file  Physical Activity:  Not on file  Stress: Not on file  Social Connections: Not on file  Intimate Partner Violence: Not on file     BP 136/74   Pulse 71   Ht 6' (1.829 m)   Wt 200 lb 9.6 oz (91 kg)   SpO2 98%   BMI 27.21 kg/m   Physical Exam:  Well appearing NAD HEENT: Unremarkable Neck:  No JVD, no thyromegally Lymphatics:  No adenopathy Back:  No CVA tenderness Lungs:  Clear HEART:  Regular rate rhythm, no murmurs, no rubs, no clicks Abd:  soft, positive bowel sounds, no organomegally, no rebound, no guarding Ext:  2 plus pulses, no edema, no cyanosis, no clubbing Skin:  No rashes no nodules Neuro:  CN II through XII intact, motor grossly intact  EKG - reviewed. NSR with mobitz 2, second degree AV block   Assess/Plan: 1.  Worsening heart block - he now has both mobitz 1 and 2 AV block. In the setting of SVT, I have recommended PPM Insertion with initiation of a beta blocker. 2. SVT  - he has episodes of SVT at 140/min. I offered him repeat ablation as welll as uptitration of his meds after PPM insertion. He would like the PPM and uptitration of medical therapy. 3. CAD - he has minimal anginal symptoms.  4. Dyslipidemia - he will continue crestor.  Carleene Overlie Shi Blankenship,MD

## 2020-06-13 NOTE — Progress Notes (Signed)
HPI: [name] returns today for followup. He is a pleasant 75 yo man with CAD, s/p CABG, HTN, and dyslipidemia. He had SVT and underwent ablation but had recurrence. 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 He remains symptomatic. He has not passed out and no near syncope. I have not seen him in almost 3 years. 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  He has not had syncope.   No Known Allergies   Current Outpatient Medications  Medication Sig Dispense Refill  . aspirin 81 MG chewable tablet Chew 1 tablet (81 mg total) by mouth daily. 90 tablet 3  . clopidogrel (PLAVIX) 75 MG tablet Take 1 tablet (75 mg total) by mouth daily with breakfast. 90 tablet 3  . rosuvastatin (CRESTOR) 40 MG tablet Take 1 tablet (40 mg total) by mouth daily. 90 tablet 3  . sildenafil (VIAGRA) 50 MG tablet Take 1 tablet (50 mg total) by mouth daily as needed for erectile dysfunction. 20 tablet 3   No current facility-administered medications for this visit.     Past Medical History:  Diagnosis Date  . Atrial fibrillation ([place])   . CAD (coronary artery disease)    CABG 2008; 03/13/2018 NSTEMI due to acute thrombotic occlusion of RCA graft, patent LIMA to LAD.  Marland Kitchen Chronic cough   . GERD (gastroesophageal reflux disease)   . Hx of adenomatous colonic polyps 04/04/2016  . Hyperlipidemia   . Hypertension   . Myocardial infarct ([place])   . SVT (supraventricular tachycardia) (HCC)     ROS:   All systems reviewed and negative except as noted in the HPI.   Past Surgical History:  Procedure Laterality Date  . CORONARY ARTERY BYPASS GRAFT  03/2006  . LEFT HEART CATH AND CORS/GRAFTS ANGIOGRAPHY N/A 03/13/2018   Procedure: LEFT HEART CATH AND CORS/GRAFTS ANGIOGRAPHY;  Surgeon: Troy Sine, MD;  Location: [place] [facility];  Service: Cardiovascular;  Laterality: N/A;  . SUPRAVENTRICULAR TACHYCARDIA ABLATION N/A 09/03/2012   Procedure: SUPRAVENTRICULAR TACHYCARDIA ABLATION;  Surgeon: Evans Lance, MD;  Location: [facility] CATH LAB;  Service: Cardiovascular;  Laterality: N/A;     Family History  Problem Relation Age of Onset  . Liver cancer Father   . Cancer Mother        bladder cancer  . Colon cancer Brother 59     Social History   Socioeconomic History  . Marital status: Married    Spouse name: Not on file  . Number of children: Not on file  . Years of education: Not on file  . Highest education level: Not on file  Occupational History  . Not on file  Tobacco Use  . Smoking status: Never Smoker  . Smokeless tobacco: Never Used  Vaping Use  . Vaping Use: Never used  Substance and Sexual Activity  . Alcohol use: Yes    Comment: 2-3 glasses wine/day  . Drug use: No  . Sexual activity: Not on file  Other Topics Concern  . Not on file  Social History Narrative   Right handed   Two story home   Drinks caffeine   Social Determinants of Health   Financial Resource Strain: Not on file  Food Insecurity: Not on file  Transportation Needs: Not on file  Physical Activity:  Not on file  Stress: Not on file  Social Connections: Not on file  Intimate Partner Violence: Not on file     BP 136/74   Pulse 71   Ht 6' (1.829 m)   Wt 200 lb 9.6 oz (91 kg)   SpO2 98%   BMI 27.21 kg/m   Physical Exam:  Well appearing NAD HEENT: Unremarkable Neck:  No JVD, no thyromegally Lymphatics:  No adenopathy Back:  No CVA tenderness Lungs:  Clear HEART:  Regular rate rhythm, no murmurs, no rubs, no clicks Abd:  soft, positive bowel sounds, no organomegally, no rebound, no guarding Ext:  2 plus pulses, no edema, no cyanosis, no clubbing Skin:  No rashes no nodules Neuro:  CN II through XII intact, motor grossly intact  EKG - reviewed. NSR with mobitz 2, second degree AV block   Assess/Plan: 1.  Worsening heart block - he now has both mobitz 1 and 2 AV block. In the setting of SVT, I have recommended PPM Insertion with initiation of a beta blocker. 2. SVT  - he has episodes of SVT at 140/min. I offered him repeat ablation as welll as uptitration of his meds after PPM insertion. He would like the PPM and uptitration of medical therapy. 3. CAD - he has minimal anginal symptoms.  4. Dyslipidemia - he will continue crestor.  [name],MD

## 2020-06-23 ENCOUNTER — Other Ambulatory Visit (HOSPITAL_COMMUNITY): Payer: Medicare (Managed Care)

## 2020-06-26 ENCOUNTER — Other Ambulatory Visit: Payer: Self-pay

## 2020-06-26 ENCOUNTER — Encounter (HOSPITAL_COMMUNITY)
Admission: RE | Disposition: A | Discharge status: Home / Self Care | Payer: Self-pay | Source: Home / Self Care | Attending: Internal Medicine

## 2020-06-26 ENCOUNTER — Ambulatory Visit (HOSPITAL_COMMUNITY)
Admission: RE | Admit: 2020-06-26 | Discharge: 2020-06-26 | Disposition: A | Discharge status: Home / Self Care | Payer: Medicare (Managed Care) | Attending: Internal Medicine | Admitting: Internal Medicine

## 2020-06-26 ENCOUNTER — Ambulatory Visit (HOSPITAL_COMMUNITY): Payer: Medicare (Managed Care)

## 2020-06-26 DIAGNOSIS — Z79899 Other long term (current) drug therapy: Secondary | ICD-10-CM | POA: Diagnosis not present

## 2020-06-26 DIAGNOSIS — Z7902 Long term (current) use of antithrombotics/antiplatelets: Secondary | ICD-10-CM | POA: Insufficient documentation

## 2020-06-26 DIAGNOSIS — Z7982 Long term (current) use of aspirin: Secondary | ICD-10-CM | POA: Insufficient documentation

## 2020-06-26 DIAGNOSIS — I10 Essential (primary) hypertension: Secondary | ICD-10-CM | POA: Insufficient documentation

## 2020-06-26 DIAGNOSIS — E785 Hyperlipidemia, unspecified: Secondary | ICD-10-CM | POA: Insufficient documentation

## 2020-06-26 DIAGNOSIS — I2510 Atherosclerotic heart disease of native coronary artery without angina pectoris: Secondary | ICD-10-CM | POA: Insufficient documentation

## 2020-06-26 DIAGNOSIS — I252 Old myocardial infarction: Secondary | ICD-10-CM | POA: Diagnosis not present

## 2020-06-26 DIAGNOSIS — Z950 Presence of cardiac pacemaker: Secondary | ICD-10-CM

## 2020-06-26 DIAGNOSIS — I441 Atrioventricular block, second degree: Secondary | ICD-10-CM

## 2020-06-26 DIAGNOSIS — I471 Supraventricular tachycardia: Secondary | ICD-10-CM | POA: Diagnosis not present

## 2020-06-26 DIAGNOSIS — Z951 Presence of aortocoronary bypass graft: Secondary | ICD-10-CM | POA: Diagnosis not present

## 2020-06-26 DIAGNOSIS — R001 Bradycardia, unspecified: Secondary | ICD-10-CM

## 2020-06-26 HISTORY — PX: PACEMAKER IMPLANT: EP1218

## 2020-06-26 SURGERY — PACEMAKER IMPLANT

## 2020-06-26 MED ORDER — ACETAMINOPHEN 325 MG PO TABS
325.0000 mg | ORAL_TABLET | ORAL | Status: DC | PRN
Start: 2020-06-26 — End: 2020-06-26

## 2020-06-26 MED ORDER — SODIUM CHLORIDE 0.9 % IV SOLN: INTRAVENOUS | Status: DC

## 2020-06-26 MED ORDER — CHLORHEXIDINE GLUCONATE 4 % EX LIQD: 4.0000 "application " | Freq: Once | CUTANEOUS | Status: DC

## 2020-06-26 MED ORDER — LIDOCAINE HCL (PF) 1 % IJ SOLN
INTRAMUSCULAR | Status: AC
  Filled 2020-06-26: qty 30

## 2020-06-26 MED ORDER — MIDAZOLAM HCL 5 MG/5ML IJ SOLN
INTRAMUSCULAR | Status: DC | PRN
  Administered 2020-06-26 (×2): 2 mg via INTRAVENOUS

## 2020-06-26 MED ORDER — CEFAZOLIN SODIUM-DEXTROSE 1-4 GM/50ML-% IV SOLN
1.0000 g | Freq: Once | INTRAVENOUS | Status: AC
  Administered 2020-06-26: 1 g via INTRAVENOUS
  Filled 2020-06-26: qty 50

## 2020-06-26 MED ORDER — FENTANYL CITRATE (PF) 100 MCG/2ML IJ SOLN
INTRAMUSCULAR | Status: DC | PRN
  Administered 2020-06-26 (×2): 25 ug via INTRAVENOUS

## 2020-06-26 MED ORDER — ONDANSETRON HCL 4 MG/2ML IJ SOLN: 4.0000 mg | Freq: Four times a day (QID) | INTRAMUSCULAR | Status: DC | PRN

## 2020-06-26 MED ORDER — HEPARIN (PORCINE) IN NACL 1000-0.9 UT/500ML-% IV SOLN
INTRAVENOUS | Status: DC | PRN
  Administered 2020-06-26: 500 mL

## 2020-06-26 MED ORDER — CEFAZOLIN SODIUM-DEXTROSE 2-4 GM/100ML-% IV SOLN
INTRAVENOUS | Status: AC
  Filled 2020-06-26: qty 100

## 2020-06-26 MED ORDER — SODIUM CHLORIDE 0.9 % IV SOLN
80.0000 mg | INTRAVENOUS | Status: AC
  Administered 2020-06-26: 80 mg

## 2020-06-26 MED ORDER — FENTANYL CITRATE (PF) 100 MCG/2ML IJ SOLN
INTRAMUSCULAR | Status: AC
  Filled 2020-06-26: qty 2

## 2020-06-26 MED ORDER — CEFAZOLIN SODIUM-DEXTROSE 2-4 GM/100ML-% IV SOLN
2.0000 g | INTRAVENOUS | Status: AC
  Administered 2020-06-26: 2 g via INTRAVENOUS

## 2020-06-26 MED ORDER — POVIDONE-IODINE 10 % EX SWAB: 2.0000 "application " | Freq: Once | CUTANEOUS | Status: DC

## 2020-06-26 MED ORDER — HEPARIN (PORCINE) IN NACL 1000-0.9 UT/500ML-% IV SOLN
INTRAVENOUS | Status: AC
  Filled 2020-06-26: qty 500

## 2020-06-26 MED ORDER — LIDOCAINE HCL (PF) 1 % IJ SOLN
INTRAMUSCULAR | Status: DC | PRN
  Administered 2020-06-26: 50 mL

## 2020-06-26 MED ORDER — MIDAZOLAM HCL 5 MG/5ML IJ SOLN
INTRAMUSCULAR | Status: AC
  Filled 2020-06-26: qty 5

## 2020-06-26 MED ORDER — SODIUM CHLORIDE 0.9 % IV SOLN
INTRAVENOUS | Status: AC
  Filled 2020-06-26: qty 2

## 2020-06-26 SURGICAL SUPPLY — 12 items
CABLE SURGICAL S-101-97-12 (CABLE) ×2 IMPLANT
CATH RIGHTSITE C315HIS02 (CATHETERS) ×2 IMPLANT
IPG PACE AZUR XT DR MRI W1DR01 (Pacemaker) ×1 IMPLANT
LEAD CAPSURE NOVUS 5076-52CM (Lead) ×2 IMPLANT
LEAD SELECT SECURE 3830 383069 (Lead) ×1 IMPLANT
PACE AZURE XT DR MRI W1DR01 (Pacemaker) ×2 IMPLANT
PAD PRO RADIOLUCENT 2001M-C (PAD) ×2 IMPLANT
SELECT SECURE 3830 383069 (Lead) ×2 IMPLANT
SHEATH 7FR PRELUDE SNAP 13 (SHEATH) ×4 IMPLANT
SLITTER 6232ADJ (MISCELLANEOUS) ×2 IMPLANT
TRAY PACEMAKER INSERTION (PACKS) ×2 IMPLANT
WIRE HI TORQ VERSACORE-J 145CM (WIRE) ×2 IMPLANT

## 2020-06-26 NOTE — Discharge Instructions (Signed)
Supplemental Discharge Instructions for  Pacemaker/Defibrillator Patients  Tomorrow, 06/27/20, send in a device transmission  Activity No heavy lifting or vigorous activity with your left/right arm for 6 to 8 weeks.  Do not raise your left/right arm above your head for one week.  Gradually raise your affected arm as drawn below.              07/03/20                    07/04/20                    07/05/20                     07/06/20                  __  NO DRIVING  you may begin driving on   05/11[birth year]  .  WOUND CARE - Keep the wound area clean and dry.  Do not get this area wet , no showers until cleared to at your wound check visit - Tomorrow, 06/27/20, remove the arm sling - Tomorrow, 06/27/20 remove the LARGE outer plastic bandage.  Underneath the plastic bandage there are steri strips (paper tapes), DO NOT remove these. - The tape/steri-strips on your wound will fall off; do not pull them off.  No bandage is needed on the site.  DO  NOT apply any creams, oils, or ointments to the wound area. - If you notice any drainage or discharge from the wound, any swelling or bruising at the site, or you develop a fever > 101? F after you are discharged home, call the office at once.  Special Instructions - You are still able to use cellular telephones; use the ear opposite the side where you have your pacemaker/defibrillator.  Avoid carrying your cellular phone near your device. - When traveling through airports, show security personnel your identification card to avoid being screened in the metal detectors.  Ask the security personnel to use the hand wand. - Avoid arc welding equipment, MRI testing (magnetic resonance imaging), TENS units (transcutaneous nerve stimulators).  Call the office for questions about other devices. - Avoid electrical appliances that are in poor condition or are not properly grounded. - Microwave ovens are safe to be near or to operate.     Pacemaker Implantation, Adult,  Care After This sheet gives you information about how to care for yourself after your procedure. Your health care provider may also give you more specific instructions. If you have problems or questions, contact your health care provider. What can I expect after the procedure? After the procedure, it is common to have:  Mild pain.  Slight bruising.  Some swelling over the incisions.  A slight bump over the skin where the device was placed (if it was implanted in the upper chest area). Sometimes, it is possible to feel the device under the skin. This is normal. Follow these instructions at home: Medicines  Take over-the-counter and prescription medicines only as told by your health care provider.  If you were prescribed an antibiotic medicine, take it as told by your health care provider. Do not stop taking the antibiotic even if you start to feel better.  Ask your health care provider if the medicine prescribed to you requires you to avoid driving or using machinery. Incision site care  Do not remove the bandage (dressing) on your chest until you are told  to do so by your health care provider.  After your dressing is removed, you may see pieces of tape called skin adhesive strips over the incision site. Let the strips fall off on their own.  Check your incision area every day for signs of infection. Check for: ? More redness, swelling, or pain. ? Fluid or blood. ? Warmth. ? Pus or a bad smell.  Do not use lotions or ointments near the incision site unless you are told to do so.  Keep the incision area clean and dry for 2-3 days after the procedure or as told by your health care provider. It takes several weeks for the incision site to completely heal.  Women may want to place a small pad over the incision site to protect it from their bra strap.  Do not take baths, swim, or use a hot tub for 7-10 days or until your health care provider approves. Ask your health care provider if  you may take showers. You may only be allowed to take sponge baths.   Activity  If you were given a medicine to help you relax (sedative) during the procedure, it can affect you for several hours. Do not drive or operate machinery until your health care provider says that it is safe.  Return to your normal activities as told by your health care provider. Ask your health care provider what activities are safe for you.  Do not lift anything that is heavier than 10 lb (4.5 kg), or the limit that you are told, until your health care provider says that it is safe.  Avoid sudden jerking, pulling, or chopping movements that pull your upper arm far away from your body. Avoid these movements for at least 6 weeks or as long as told by your health care provider.  Do not lift your upper arm above your shoulders for at least 6 weeks or as long as told by your health care provider. This includes activities like tennis, golf, or swimming.  You may go back to work when your health care provider says it is okay.   Electricity and magnetic fields  Avoid places or objects that have a strong electric or magnetic field, including: ? Airport Data processing manager. When at the airport, let officials know that you have a pacemaker. Carry your pacemaker ID card. ? Metal detectors. If you must pass through a metal detector, walk through it quickly. Do not stop under the detector or stand near it. ? Power plants. ? Large electrical generators. ? Radiofrequency transmission towers, such as mobile phone and radio towers.  Do not use amateur Chief of Staff. If you are unsure of whether something is safe to use, ask your health care provider. Some devices may be safe to use if you hold them at least 1 ft (0.3 m) from your pacemaker. These devices may include power tools, lawn mowers, and speakers.  When using your mobile phone, hold it to the ear opposite the pacemaker. Do not leave your mobile  phone in a pocket over the pacemaker. Long-term care  You may be shown how to transfer data from your pacemaker through the phone to your health care provider.  Always let all health care providers, including dentists, know about your pacemaker before you have any medical procedures or tests.  Wear a medical ID bracelet or necklace stating that you have a pacemaker.  Carry a pacemaker ID card with you at all times.  Your pacemaker battery will  last for 5-15 years. Your health care provider will do routine checks to know when the battery is starting to run down. When this happens, the pacemaker will need to be replaced. General instructions  Do not use any products that contain nicotine or tobacco, such as cigarettes, e-cigarettes, and chewing tobacco. These can delay incision healing after surgery. If you need help quitting, ask your health care provider.  Follow instructions from your health care provider about eating or drinking restrictions.  Weigh yourself every day. If you suddenly gain weight, fluid may be building up in your body.  Keep all follow-up visits as told by your health care provider. This is important. During follow-up visits, your pacemaker will be checked and reprogrammed if necessary. Contact a health care provider if:  You gain weight suddenly.  Your legs or feet swell.  It feels like your heart is fluttering or skipping beats (you have heart palpitations).  You have any of these signs of infection: ? More redness, swelling, or pain around an incision. ? Fluid or blood coming from an incision. ? Warmth coming from an incision. ? Pus or a bad smell coming from an incision. ? A fever or chills. Get help right away if:  You have chest pain.  You have trouble breathing or are short of breath.  You become extremely tired.  You are light-headed or you faint. These symptoms may represent a serious problem that is an emergency. Do not wait to see if the  symptoms will go away. Get medical help right away. Call your local emergency services (911 in the [place] Do not drive yourself to the hospital. Summary  After the procedure, it is common to have mild pain, swelling, or bruising over the incision.  Take over-the-counter and prescription medicines only as told by your health care provider.  Do not raise your arm above your shoulder or lift anything that is heavier than 10 lb (4.5 kg), or the limit that you are told, until your health care provider says that it is safe.  Carry a pacemaker ID card with you at all times. This information is not intended to replace advice given to you by your health care provider. Make sure you discuss any questions you have with your health care provider. Document Revised: 12/23/2018 Document Reviewed: 12/23/2018 Elsevier Patient Education  2021 [employer].

## 2020-06-26 NOTE — Interval H&P Note (Signed)
History and Physical Interval Note:  06/26/2020 12:44 PM  Steven Mathews  has presented today for surgery, with the diagnosis of heart block.  The various methods of treatment have been discussed with the patient and family. After consideration of risks, benefits and other options for treatment, the patient has consented to  Procedure(s): PACEMAKER IMPLANT (N/A) as a surgical intervention.  The patient's history has been reviewed, patient examined, no change in status, stable for surgery.  I have reviewed the patient's chart and labs.  Questions were answered to the patient's satisfaction.     Steven Mathews

## 2020-06-26 NOTE — Progress Notes (Signed)
Dr Taylor notified of CXR results and ok to d/c home 

## 2020-06-27 ENCOUNTER — Encounter (HOSPITAL_COMMUNITY): Payer: Self-pay | Admitting: Internal Medicine

## 2020-06-27 ENCOUNTER — Telehealth: Payer: Self-pay

## 2020-06-27 NOTE — Telephone Encounter (Signed)
-----   Message from [place], [place] sent at 06/26/2020  5:57 PM EDT ----- Same day d/c MDT pacer  GT

## 2020-06-27 NOTE — Telephone Encounter (Signed)
Spoke with patient to send a remote transmission from the app per Leigh. Patient states he will send that transmission and call us if he has trouble

## 2020-07-06 ENCOUNTER — Ambulatory Visit (INDEPENDENT_AMBULATORY_CARE_PROVIDER_SITE_OTHER): Payer: Medicare (Managed Care) | Admitting: Emergency Medicine

## 2020-07-06 ENCOUNTER — Other Ambulatory Visit: Payer: Self-pay

## 2020-07-06 DIAGNOSIS — I459 Conduction disorder, unspecified: Secondary | ICD-10-CM

## 2020-07-07 LAB — CUP PACEART INCLINIC DEVICE CHECK
Battery Remaining Longevity: 148 mo
Battery Voltage: 3.21 V
Brady Statistic AP VP Percent: 0.71 %
Brady Statistic AP VS Percent: 0.01 %
Brady Statistic AS VP Percent: 90.81 %
Brady Statistic AS VS Percent: 8.47 %
Brady Statistic RA Percent Paced: 0.84 %
Brady Statistic RV Percent Paced: 91.51 %
Date Time Interrogation Session: 20220602121300
Implantable Lead Implant Date: 20220523
Implantable Lead Implant Date: 20220523
Implantable Lead Location: 753859
Implantable Lead Location: 753860
Implantable Lead Model: 3830
Implantable Lead Model: 5076
Implantable Pulse Generator Implant Date: 20220523
Lead Channel Impedance Value: 285 Ohm
Lead Channel Impedance Value: 456 Ohm
Lead Channel Impedance Value: 570 Ohm
Lead Channel Impedance Value: 703 Ohm
Lead Channel Pacing Threshold Amplitude: 0.75 V
Lead Channel Pacing Threshold Amplitude: 1 V
Lead Channel Pacing Threshold Pulse Width: 0.4 ms
Lead Channel Pacing Threshold Pulse Width: 0.4 ms
Lead Channel Sensing Intrinsic Amplitude: 19.125 mV
Lead Channel Sensing Intrinsic Amplitude: 4.25 mV
Lead Channel Setting Pacing Amplitude: 3.5 V
Lead Channel Setting Pacing Amplitude: 3.5 V
Lead Channel Setting Pacing Pulse Width: 0.4 ms
Lead Channel Setting Sensing Sensitivity: 1.2 mV

## 2020-07-07 NOTE — Progress Notes (Signed)
Wound check appointment. Steri-strips removed by patient two days ago. Wound without redness or edema. Incision edges approximated, wound well healed. Normal device function. Thresholds, sensing, and impedances consistent with implant measurements. Device programmed at 3.5V for extra safety margin until 3 month visit. Histogram distribution appropriate for patient and level of activity. No mode switches or high ventricular rates noted. Patient enrolled in remote transmissions with next transmission schedued 09/28/20. 91 day follow up with Dr Lovena Le 09/28/20. Patient educated about wound care, arm mobility, lifting restrictions.

## 2020-07-11 NOTE — Telephone Encounter (Signed)
Remote transmission received and attended f/u wound check.

## 2020-07-13 ENCOUNTER — Ambulatory Visit: Payer: PRIVATE HEALTH INSURANCE | Admitting: Cardiology

## 2020-07-26 ENCOUNTER — Telehealth: Payer: Self-pay | Admitting: Internal Medicine

## 2020-07-26 NOTE — Telephone Encounter (Signed)
From device standpoint, extraction will not affect PPM as long as Cautery is not being used.

## 2020-07-26 NOTE — Telephone Encounter (Signed)
I will need more information - what is being asked? What dentist and what is the phone number? If urgent, may also touch base with the DOD.

## 2020-07-26 NOTE — Telephone Encounter (Signed)
Steven Mathews wants to know if its okay for the patient to have a took extractions. I spoke with Amy,rn and she states as long as they are not using any cautery he can have the extraction. Steven Mathews verbalized understanding.

## 2020-07-26 NOTE — Telephone Encounter (Signed)
New message:   Pt is at the dentist office now

## 2020-08-01 DIAGNOSIS — R001 Bradycardia, unspecified: Secondary | ICD-10-CM | POA: Insufficient documentation

## 2020-08-01 NOTE — Progress Notes (Signed)
Cardiology Office Note   Date:  08/03/2020   ID:  Steven Mathews, DOB May 30, 1945, MRN [id number]  PCP:  Cathlean Sauer, MD  Cardiologist:   Minus Breeding, MD  Chief Complaint  Patient presents with   Coronary Artery Disease       History of Present Illness: Steven Mathews is a 75 y.o. male with CAD s/p CABG.  He presented to the hospital on 03/13/2018 with inferior STEMI.  Cardiac catheterization performed on 03/13/2018 showed 40% ostial to proximal LAD lesion, 30% proximal LAD lesion, 55% proximal left circumflex lesion, 100% occlusion of SVG to distal RCA with extensive thrombus.  He had a patent LIMA to mid LAD, chronically occluded SVG to obtuse marginal branch.  EF 40 to 45%.  Medical therapy was recommended.  Echocardiogram obtained on 03/15/2018 showed EF 60 to 65%, moderately reduced RVEF, mild dilatation of the aortic root.  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  Beta-blocker was initially held, however later was reintroduced at a low dose.   However he had symptomatic bradycardia with Mobitz Type II and had dual chamber pacemaker.    He has done much better he thinks with fewer palpitations but he still getting some rapid rates on a daily basis.  He is not having any presyncope or syncope.  He denies any chest pressure, neck or arm discomfort.  He has no new shortness of breath, PND or orthopnea.  He is staying active but he is not yet released to do golfing until next week    Past Medical History:  Diagnosis Date   Atrial fibrillation ([place])    CAD (coronary artery disease)    CABG 2008; 03/13/2018 NSTEMI due to acute thrombotic occlusion of RCA graft, patent LIMA to LAD.   Chronic cough    GERD (gastroesophageal reflux disease)    Hx of adenomatous colonic polyps 04/04/2016   Hyperlipidemia    Hypertension    Myocardial infarct [facility])    SVT (supraventricular tachycardia) ([place])     Past Surgical History:   Procedure Laterality Date   CORONARY ARTERY BYPASS GRAFT  03/2006   LEFT HEART CATH AND CORS/GRAFTS ANGIOGRAPHY N/A 03/13/2018   Procedure: LEFT HEART CATH AND CORS/GRAFTS ANGIOGRAPHY;  Surgeon: Troy Sine, MD;  Location: [place] [facility];  Service: Cardiovascular;  Laterality: N/A;   PACEMAKER IMPLANT N/A 06/26/2020   Procedure: PACEMAKER IMPLANT;  Surgeon: Evans Lance, MD;  Location: [place] [facility];  Service: Cardiovascular;  Laterality: N/A;   SUPRAVENTRICULAR TACHYCARDIA ABLATION N/A 09/03/2012   Procedure: SUPRAVENTRICULAR TACHYCARDIA ABLATION;  Surgeon: Evans Lance, MD;  Location: [facility] C[facility] LAB;  Service: Cardiovascular;  Laterality: N/A;     Current Outpatient Medications  Medication Sig Dispense Refill   aspirin 81 MG chewable tablet Chew 1 tablet (81 mg total) by mouth daily. 90 tablet 3   metoprolol succinate (TOPROL-XL) 50 MG 24 hr tablet Take 1 tablet (50 mg total) by mouth daily. Take with or immediately following a meal. 90 tablet 3   Multiple Vitamins-Minerals (MULTIVITAMIN WITH MINERALS) tablet Take 1 tablet by mouth daily. Vitafusion     OVER THE COUNTER MEDICATION Take 2 tablets by mouth daily. Vitafusion gummies Vitamin D     rosuvastatin (CRESTOR) 40 MG tablet Take 1 tablet (40 mg total) by mouth daily. 90 tablet 3   sildenafil (VIAGRA) 50 MG tablet Take 1 tablet (50 mg total) by mouth daily as needed  for erectile dysfunction. 20 tablet 3   No current facility-administered medications for this visit.    Allergies:   Patient has no known allergies.    ROS:  Please see the history of present illness.   Otherwise, review of systems are positive for none.   All other systems are reviewed and negative.    PHYSICAL EXAM: VS:  BP 136/82   Pulse 92   Ht 6' (1.829 m)   Wt 199 lb (90.3 kg)   SpO2 96%   BMI 26.99 kg/m  , BMI Body mass index is 26.99 kg/m. GENERAL:  Well appearing NECK:  No jugular venous distention, waveform within normal limits, carotid  upstroke brisk and symmetric, no bruits, no thyromegaly LUNGS:  Clear to auscultation bilaterally CHEST:  Well healed pacemaker pocket, well-healed sternotomy scar HEART:  PMI not displaced or sustained,S1 and S2 within normal limits, no S3, no S4, no clicks, no rubs, no murmurs ABD:  Flat, positive bowel sounds normal in frequency in pitch, no bruits, no rebound, no guarding, no midline pulsatile mass, no hepatomegaly, no splenomegaly EXT:  2 plus pulses throughout, no edema, no cyanosis no clubbing   EKG:  EKG is not ordered today.   Recent Labs: 04/25/2020: ALT 20; TSH 3.41 06/13/2020: BUN 16; Creatinine, Ser 1.12; Hemoglobin 15.1; Platelets 264; Potassium 5.0; Sodium 140    Lipid Panel    Component Value Date/Time   CHOL 158 04/25/2020 0857   TRIG 142 04/25/2020 0857   HDL 47 04/25/2020 0857   CHOLHDL 3.4 04/25/2020 0857   CHOLHDL 4.2 03/13/2018 2055   VLDL 14 03/13/2018 2055   LDLCALC 86 04/25/2020 0857   LDLDIRECT 114.1 06/16/2012 1032      Wt Readings from Last 3 Encounters:  08/03/20 199 lb (90.3 kg)  06/26/20 195 lb (88.5 kg)  06/13/20 200 lb 9.6 oz (91 kg)      Other studies Reviewed: Additional studies/ records that were reviewed today include: EP records Review of the above records demonstrates:   See elsewhere   ASSESSMENT AND PLAN:  CAD s/p CABG:    The patient has no new sypmtoms.  No further cardiovascular testing is indicated.  We will continue with aggressive risk reduction and meds as listed.  Hypertension:   The blood pressure is at target.  No change in therapy.  Meds are changed as above.   Hyperlipidemia:    LDL was mildly elevated ag47.  We talked about a plant based diet  Bradycardia:   He is now status post pacemaker.  I will manage his rapid rates as above.   SVT: He is still having tachypalpitations so I will prescribe Toprol-XL 50 mg daily.  He has not tolerated this prior to the pacemaker.  ED: I will prescribe Viagra as  needed.   Current medicines are reviewed at length with the patient today.  The patient does not have concerns regarding medicines.  The following changes have been made:  As above.    Labs/ tests ordered today include:       No orders of the defined types were placed in this encounter.    Disposition:   FU with me in 6 months.    Signed, Minus Breeding, MD  08/03/2020 9:05 AM    [place]

## 2020-08-03 ENCOUNTER — Ambulatory Visit (INDEPENDENT_AMBULATORY_CARE_PROVIDER_SITE_OTHER): Payer: Medicare (Managed Care) | Admitting: Cardiology

## 2020-08-03 ENCOUNTER — Other Ambulatory Visit: Payer: Self-pay

## 2020-08-03 ENCOUNTER — Encounter: Payer: Self-pay | Admitting: Cardiology

## 2020-08-03 VITALS — BP 136/82 | HR 92 | Ht 72.0 in | Wt 199.0 lb

## 2020-08-03 DIAGNOSIS — I10 Essential (primary) hypertension: Secondary | ICD-10-CM | POA: Diagnosis not present

## 2020-08-03 DIAGNOSIS — E785 Hyperlipidemia, unspecified: Secondary | ICD-10-CM | POA: Diagnosis not present

## 2020-08-03 DIAGNOSIS — R001 Bradycardia, unspecified: Secondary | ICD-10-CM | POA: Diagnosis not present

## 2020-08-03 DIAGNOSIS — R251 Tremor, unspecified: Secondary | ICD-10-CM

## 2020-08-03 DIAGNOSIS — I498 Other specified cardiac arrhythmias: Secondary | ICD-10-CM | POA: Diagnosis not present

## 2020-08-03 MED ORDER — ROSUVASTATIN CALCIUM 40 MG PO TABS: 40.0000 mg | ORAL_TABLET | Freq: Every day | ORAL | 3 refills | Status: DC

## 2020-08-03 MED ORDER — METOPROLOL SUCCINATE ER 50 MG PO TB24: 50.0000 mg | ORAL_TABLET | Freq: Every day | ORAL | 3 refills | Status: DC

## 2020-08-03 MED ORDER — SILDENAFIL CITRATE 50 MG PO TABS: 50.0000 mg | ORAL_TABLET | Freq: Every day | ORAL | 3 refills | Status: DC | PRN

## 2020-08-03 NOTE — Patient Instructions (Signed)
Medication Instructions:   START METOPROLOL SUCC ER 50 MG ONCE DAILY  *If you need a refill on your cardiac medications before your next appointment, please call your pharmacy*  Follow-Up: At [facility], you and your health needs are our priority.  As part of our continuing mission to provide you with exceptional heart care, we have created designated Provider Care Teams.  These Care Teams include your primary Cardiologist (physician) and Advanced Practice Providers (APPs -  Physician Assistants and Nurse Practitioners) who all work together to provide you with the care you need, when you need it.  We recommend signing up for the patient portal called "MyChart".  Sign up information is provided on this After Visit Summary.  MyChart is used to connect with patients for Virtual Visits (Telemedicine).  Patients are able to view lab/test results, encounter notes, upcoming appointments, etc.  Non-urgent messages can be sent to your provider as well.   To learn more about what you can do with MyChart, go to [email or web address].    Your next appointment:   6 month(s)  The format for your next appointment:   In Person  Provider:   Minus Breeding, MD

## 2020-09-26 ENCOUNTER — Ambulatory Visit (INDEPENDENT_AMBULATORY_CARE_PROVIDER_SITE_OTHER): Payer: Medicare (Managed Care)

## 2020-09-26 DIAGNOSIS — I459 Conduction disorder, unspecified: Secondary | ICD-10-CM

## 2020-09-27 LAB — CUP PACEART REMOTE DEVICE CHECK
Battery Remaining Longevity: 143 mo
Battery Voltage: 3.18 V
Brady Statistic AP VP Percent: 8.26 %
Brady Statistic AP VS Percent: 0.04 %
Brady Statistic AS VP Percent: 90.76 %
Brady Statistic AS VS Percent: 0.94 %
Brady Statistic RA Percent Paced: 8.2 %
Brady Statistic RV Percent Paced: 99.02 %
Date Time Interrogation Session: 20220824104541
Implantable Lead Implant Date: 20220523
Implantable Lead Implant Date: 20220523
Implantable Lead Location: 753859
Implantable Lead Location: 753860
Implantable Lead Model: 3830
Implantable Lead Model: 5076
Implantable Pulse Generator Implant Date: 20220523
Lead Channel Impedance Value: 266 Ohm
Lead Channel Impedance Value: 361 Ohm
Lead Channel Impedance Value: 475 Ohm
Lead Channel Impedance Value: 608 Ohm
Lead Channel Pacing Threshold Amplitude: 0.625 V
Lead Channel Pacing Threshold Amplitude: 0.875 V
Lead Channel Pacing Threshold Pulse Width: 0.4 ms
Lead Channel Pacing Threshold Pulse Width: 0.4 ms
Lead Channel Sensing Intrinsic Amplitude: 2.125 mV
Lead Channel Sensing Intrinsic Amplitude: 2.125 mV
Lead Channel Sensing Intrinsic Amplitude: 21 mV
Lead Channel Sensing Intrinsic Amplitude: 21 mV
Lead Channel Setting Pacing Amplitude: 1.75 V
Lead Channel Setting Pacing Amplitude: 2.5 V
Lead Channel Setting Pacing Pulse Width: 0.4 ms
Lead Channel Setting Sensing Sensitivity: 1.2 mV

## 2020-09-28 ENCOUNTER — Encounter: Payer: Medicare (Managed Care) | Admitting: Internal Medicine

## 2020-10-11 NOTE — Progress Notes (Signed)
Remote pacemaker transmission.   

## 2020-11-06 ENCOUNTER — Other Ambulatory Visit: Payer: Self-pay | Admitting: Otolaryngology

## 2020-11-06 ENCOUNTER — Other Ambulatory Visit (HOSPITAL_COMMUNITY): Payer: Self-pay | Admitting: Otolaryngology

## 2020-11-06 DIAGNOSIS — C770 Secondary and unspecified malignant neoplasm of lymph nodes of head, face and neck: Secondary | ICD-10-CM

## 2020-11-13 ENCOUNTER — Other Ambulatory Visit: Payer: Self-pay | Admitting: Otolaryngology

## 2020-11-13 ENCOUNTER — Other Ambulatory Visit (HOSPITAL_COMMUNITY): Payer: Self-pay | Admitting: Otolaryngology

## 2020-11-13 DIAGNOSIS — C770 Secondary and unspecified malignant neoplasm of lymph nodes of head, face and neck: Secondary | ICD-10-CM

## 2020-11-22 ENCOUNTER — Other Ambulatory Visit: Payer: Medicare (Managed Care)

## 2020-11-23 ENCOUNTER — Ambulatory Visit: Payer: Medicare (Managed Care)

## 2020-11-29 ENCOUNTER — Ambulatory Visit (HOSPITAL_COMMUNITY): Admission: RE | Admit: 2020-11-29 | Payer: Medicare (Managed Care) | Source: Ambulatory Visit

## 2020-11-29 ENCOUNTER — Other Ambulatory Visit: Payer: Self-pay

## 2020-11-29 ENCOUNTER — Encounter (HOSPITAL_COMMUNITY)
Admission: RE | Admit: 2020-11-29 | Discharge: 2020-11-29 | Disposition: A | Discharge status: Home / Self Care | Payer: Medicare (Managed Care) | Source: Ambulatory Visit | Attending: Otolaryngology | Admitting: Otolaryngology

## 2020-11-29 DIAGNOSIS — C770 Secondary and unspecified malignant neoplasm of lymph nodes of head, face and neck: Secondary | ICD-10-CM | POA: Diagnosis not present

## 2020-11-29 DIAGNOSIS — I700 Atherosclerosis of aorta: Secondary | ICD-10-CM | POA: Diagnosis not present

## 2020-11-29 DIAGNOSIS — R590 Localized enlarged lymph nodes: Secondary | ICD-10-CM | POA: Diagnosis not present

## 2020-11-29 LAB — GLUCOSE, CAPILLARY: Glucose-Capillary: 107 mg/dL — ABNORMAL HIGH (ref 70–99)

## 2020-11-29 MED ORDER — FLUDEOXYGLUCOSE F - 18 (FDG) INJECTION
9.9000 | Freq: Once | INTRAVENOUS | Status: AC | PRN
  Administered 2020-11-29: 9.9 via INTRAVENOUS

## 2020-12-04 NOTE — H&P (Signed)
HPI:   Steven Mathews is a 75 y.o. male who presents as a consult Patient.   Referring Provider: Self, A Referral  Chief complaint: Neck mass.  HPI: About 5 weeks ago he had an infected right maxillary third molar treated and then ultimately extracted. Shortly thereafter he developed a mass in the right neck. It has not really changed since then. He does not smoke. He drinks a couple of glasses of wine each evening. He drinks a few servings of coffee daily and enjoys chocolate on a somewhat regular basis. He denies any sore throat but he does have some difficulty swallowing and sometimes food gets stuck in the area that he points to the upper esophagus. That has been going on for about a year and a half. He saw a GI physician recently who recommended endoscopy but wants to wait and see what the neck mass ends up being. He denies ear pain except when his hearing aids are bothering him. No weight loss or fevers. 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  PMH/Meds/All/SocHx/FamHx/ROS:   Past Medical History:  Diagnosis Date   Hyperlipemia   Hypertension   Past Surgical History:  Procedure Laterality Date   SHOULDER SURGERY   No family history of bleeding disorders, wound healing problems or difficulty with anesthesia.   Social History   Socioeconomic History   Marital status: Unknown  Spouse name: Not on file   Number of children: Not on file   Years of education: Not on file   Highest education level: Not on file  Occupational History   Not on file  Tobacco Use   Smoking status: Never Smoker   Smokeless tobacco: Never Used  Vaping Use   Vaping Use: Never used  Substance and Sexual Activity   Alcohol use: Yes  Comment: wine daily   Drug use: Never   Sexual activity: Not on file  Other Topics Concern   Not on file   Social History Narrative   Not on file   Social Determinants of Health   Financial Resource Strain: Not on file  Food Insecurity: Not on file  Transportation Needs: Not on file  Physical Activity: Not on file  Stress: Not on file  Social Connections: Not on file  Housing Stability: Not on file   Current Outpatient Medications:   aspirin 81 MG chewable tablet *ANTIPLATELET*, Take 81 mg by mouth daily., Disp: , Rfl:   metoPROLOL succinate (TOPROL-XL) 50 MG 24 hr tablet, Take 50 mg by mouth daily., Disp: , Rfl:   rosuvastatin (CRESTOR) 40 MG tablet, Take 40 mg by mouth daily., Disp: , Rfl:   sildenafiL (VIAGRA) 50 MG tablet, Take 50 mg by mouth as needed., Disp: , Rfl:   A complete ROS was performed with pertinent positives/negatives noted in the HPI. The remainder of the ROS are negative.   Physical Exam:   BP 135/66  Pulse 66  Temp 97.2 F (36.2 C)  Ht 1.829 m (6')  Wt 90.3 kg (199 lb)  BMI 26.99 kg/m   General: Healthy and alert, in no distress, breathing easily. Normal affect. In a pleasant mood. Head: Normocephalic, atraumatic. No masses, or scars. Eyes: Pupils are equal, and reactive to light. Vision is grossly intact. No spontaneous or gaze nystagmus. Ears: Ear canals are clear. Tympanic membranes are intact, with normal  landmarks and the middle ears are clear and healthy. Hearing: Grossly normal. Nose: Nasal cavities are clear with healthy mucosa, no polyps or exudate. Airways are patent. Face: No masses or scars, facial nerve function is symmetric. Oral Cavity: No mucosal abnormalities are noted. Tongue with normal mobility. Dentition appears healthy. Oropharynx: Tonsils are symmetric. There are no mucosal masses identified. Tongue base appears normal and healthy. Larynx/Hypopharynx: indirect exam reveals healthy, mobile vocal cords, without mucosal lesions in the hypopharynx or larynx. Chest: Deferred Neck: 3-4 cm soft mass right anterior level 2 area, no other  adenopathy or masses palpable, no thyroid nodules or enlargement. Neuro: Cranial nerves II-XII with normal function. Balance: Normal gate. Other findings: none.  Independent Review of Additional Tests or Records:  none  Procedures:  Procedure Note:  Indications for procedure: neck mass  Details of the procedure were discussed with the patient and all questions were answered.  Procedure:  2% xylocaine with epinephrine was infiltrated into the overlying skin. First pass was made with a 25 gauge needle and 10 cc syringe. Second pass was made with a 22 gauge needle. Specimen was placed on microscopic slides and air-dried. Additional material was placed in Cytolyte solution for cell block preparation. Only 2 needles were used. Thick slightly dark fluid was obtained.  A bandage was applied.   He tolerated the procedure well. Results will be discussed when available.  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 FNA performed today. If it is negative then we will proceed with excision understanding that there is still a chance the final pathology could show metastatic carcinoma. If it is positive for carcinoma then we will continue the work-up and figure out the best treatment.  We discussed causes of reflux, including lifestyle and dietary factors. Recommend strict avoidance of all tobacco, caffeine, alcohol, chocolate and peppermint. A reflux handout with more detailed instructions was provided to the patient.

## 2020-12-06 ENCOUNTER — Encounter: Payer: Self-pay | Admitting: Internal Medicine

## 2020-12-06 NOTE — Progress Notes (Signed)
Surgical Instructions    Your procedure is scheduled on 12/11/20.  Report to [facility] Main Entrance "A" at 6:30 A.M., then check in with the Admitting office.  Call this number if you have problems the morning of surgery:  [phone]   If you have any questions prior to your surgery date call [phone]: Open Monday-Friday 8am-4pm    Remember:  Do not eat after midnight the night before your surgery     Take these medicines the morning of surgery with A SIP OF WATER: metoprolol succinate (TOPROL-XL) omeprazole (PRILOSEC)  rosuvastatin (CRESTOR)    As of today, STOP taking any Aspirin (unless otherwise instructed by your surgeon) Aleve, Naproxen, Ibuprofen, Motrin, Advil, Goody's, BC's, all herbal medications, fish oil, and all vitamins.    After your COVID test   You are not required to quarantine however you are required to wear a well-fitting mask when you are out and around people not in your household.  If your mask becomes wet or soiled, replace with a new one.  Wash your hands often with soap and water for 20 seconds or clean your hands with an alcohol-based hand sanitizer that contains at least 60% alcohol.  Do not share personal items.  Notify your provider: if you are in close contact with someone who has COVID  or if you develop a fever of 100.4 or greater, sneezing, cough, sore throat, shortness of breath or body aches.           Do not wear jewelry  Do not wear lotions, powders, colognes, or deodorant. Do not shave 48 hours prior to surgery.  Men may shave face and neck. Do not bring valuables to the hospital.              [facility] is not responsible for any belongings or valuables.  Do NOT Smoke (Tobacco/Vaping)  24 hours prior to your procedure  If you use a CPAP at night, you may bring your mask for your overnight stay.   Contacts, glasses, hearing aids, dentures or partials may not be worn into surgery, please bring cases for these  belongings   For patients admitted to the hospital, discharge time will be determined by your treatment team.   Patients discharged the day of surgery will not be allowed to drive home, and someone needs to stay with them for 24 hours.  NO VISITORS WILL BE ALLOWED IN PRE-OP WHERE PATIENTS ARE PREPPED FOR SURGERY.  ONLY 1 SUPPORT PERSON MAY BE PRESENT IN THE WAITING ROOM WHILE YOU ARE IN SURGERY.  IF YOU ARE TO BE ADMITTED, ONCE YOU ARE IN YOUR ROOM YOU WILL BE ALLOWED TWO (2) VISITORS. 1 (ONE) VISITOR MAY STAY OVERNIGHT BUT MUST ARRIVE TO THE ROOM BY 8pm.  Minor children may have two parents present. Special consideration for safety and communication needs will be reviewed on a case by case basis.  Special instructions:    Oral Hygiene is also important to reduce your risk of infection.  Remember - BRUSH YOUR TEETH THE MORNING OF SURGERY WITH YOUR REGULAR TOOTHPASTE   [place]- Preparing For Surgery  Before surgery, you can play an important role. Because skin is not sterile, your skin needs to be as free of germs as possible. You can reduce the number of germs on your skin by washing with CHG (chlorahexidine gluconate) Soap before surgery.  CHG is an antiseptic cleaner which kills germs and bonds with the skin to continue killing germs even after washing.  Please do not use if you have an allergy to CHG or antibacterial soaps. If your skin becomes reddened/irritated stop using the CHG.  Do not shave (including legs and underarms) for at least 48 hours prior to first CHG shower. It is OK to shave your face.  Please follow these instructions carefully.     Shower the NIGHT BEFORE SURGERY and the MORNING OF SURGERY with CHG Soap.   If you chose to wash your hair, wash your hair first as usual with your normal shampoo. After you shampoo, rinse your hair and body thoroughly to remove the shampoo.  Then [employer] and genitals (private parts) with your normal soap and rinse thoroughly to  remove soap.  After that Use CHG Soap as you would any other liquid soap. You can apply CHG directly to the skin and wash gently with a scrungie or a clean washcloth.   Apply the CHG Soap to your body ONLY FROM THE NECK DOWN.  Do not use on open wounds or open sores. Avoid contact with your eyes, ears, mouth and genitals (private parts). Wash Face and genitals (private parts)  with your normal soap.   Wash thoroughly, paying special attention to the area where your surgery will be performed.  Thoroughly rinse your body with warm water from the neck down.  DO NOT shower/wash with your normal soap after using and rinsing off the CHG Soap.  Pat yourself dry with a CLEAN TOWEL.  Wear CLEAN PAJAMAS to bed the night before surgery  Place CLEAN SHEETS on your bed the night before your surgery  DO NOT SLEEP WITH PETS.   Day of Surgery:  Take a shower with CHG soap. Wear Clean/Comfortable clothing the morning of surgery Do not apply any deodorants/lotions.   Remember to brush your teeth WITH YOUR REGULAR TOOTHPASTE.   Please read over the following fact sheets that you were given.

## 2020-12-06 NOTE — Progress Notes (Signed)
[facility] DEVICE PROGRAMMING  Patient Information: Name:  Steven Mathews  DOB:  [birth year]  MRN:  [id number]    Patient: Steven Mathews, DOB 12/06/1945  Planned Procedure:  endoscopy, biopsy, esophagoscopy, tonsillectomy, right modified neck dissection  Surgeon:  Dr. Izora Gala  Date of Procedure:  12/11/20  Cautery will be used.  Position during surgery:     Please send documentation back to:  [facility] (Fax # [phone])  Device Information:  Clinic EP Physician:  Cristopher Peru, MD   Device Type:  Pacemaker Manufacturer and Phone #:  [employer]: [id number] Pacemaker Dependent?:  Yes.   Date of Last Device Check:  09/27/20 (remote) Normal Device Function?:  Yes.    Electrophysiologist's Recommendations:  Have magnet available. Provide continuous ECG monitoring when magnet is used or reprogramming is to be performed.  Device should be programmed prior to procedure due to interference.   Per Device Clinic Standing Orders, Simone Curia, RN  2:04 PM 12/06/2020

## 2020-12-07 ENCOUNTER — Encounter (HOSPITAL_COMMUNITY): Payer: Self-pay

## 2020-12-07 ENCOUNTER — Other Ambulatory Visit: Payer: Self-pay

## 2020-12-07 ENCOUNTER — Encounter (HOSPITAL_COMMUNITY)
Admission: RE | Admit: 2020-12-07 | Discharge: 2020-12-07 | Disposition: A | Discharge status: Home / Self Care | Payer: Medicare (Managed Care) | Source: Ambulatory Visit | Attending: Otolaryngology | Admitting: Otolaryngology

## 2020-12-07 VITALS — BP 120/74 | HR 67 | Temp 98.1°F | Resp 17 | Ht 72.0 in | Wt 199.4 lb

## 2020-12-07 DIAGNOSIS — Z950 Presence of cardiac pacemaker: Secondary | ICD-10-CM | POA: Diagnosis not present

## 2020-12-07 DIAGNOSIS — Z951 Presence of aortocoronary bypass graft: Secondary | ICD-10-CM | POA: Diagnosis not present

## 2020-12-07 DIAGNOSIS — I4891 Unspecified atrial fibrillation: Secondary | ICD-10-CM | POA: Diagnosis not present

## 2020-12-07 DIAGNOSIS — Z7982 Long term (current) use of aspirin: Secondary | ICD-10-CM | POA: Diagnosis not present

## 2020-12-07 DIAGNOSIS — R221 Localized swelling, mass and lump, neck: Secondary | ICD-10-CM | POA: Insufficient documentation

## 2020-12-07 DIAGNOSIS — Z01812 Encounter for preprocedural laboratory examination: Secondary | ICD-10-CM | POA: Insufficient documentation

## 2020-12-07 DIAGNOSIS — I471 Supraventricular tachycardia: Secondary | ICD-10-CM | POA: Diagnosis not present

## 2020-12-07 DIAGNOSIS — R053 Chronic cough: Secondary | ICD-10-CM | POA: Diagnosis not present

## 2020-12-07 DIAGNOSIS — Z20822 Contact with and (suspected) exposure to covid-19: Secondary | ICD-10-CM | POA: Diagnosis not present

## 2020-12-07 DIAGNOSIS — I252 Old myocardial infarction: Secondary | ICD-10-CM | POA: Insufficient documentation

## 2020-12-07 DIAGNOSIS — R1314 Dysphagia, pharyngoesophageal phase: Secondary | ICD-10-CM | POA: Insufficient documentation

## 2020-12-07 DIAGNOSIS — I2510 Atherosclerotic heart disease of native coronary artery without angina pectoris: Secondary | ICD-10-CM | POA: Insufficient documentation

## 2020-12-07 DIAGNOSIS — Z01818 Encounter for other preprocedural examination: Secondary | ICD-10-CM

## 2020-12-07 DIAGNOSIS — K219 Gastro-esophageal reflux disease without esophagitis: Secondary | ICD-10-CM | POA: Insufficient documentation

## 2020-12-07 DIAGNOSIS — Z79899 Other long term (current) drug therapy: Secondary | ICD-10-CM | POA: Insufficient documentation

## 2020-12-07 DIAGNOSIS — I10 Essential (primary) hypertension: Secondary | ICD-10-CM | POA: Insufficient documentation

## 2020-12-07 HISTORY — DX: Presence of cardiac pacemaker: Z95.0

## 2020-12-07 LAB — CBC
HCT: 41.2 % (ref 39.0–52.0)
Hemoglobin: 13.4 g/dL (ref 13.0–17.0)
MCH: 32.1 pg (ref 26.0–34.0)
MCHC: 32.5 g/dL (ref 30.0–36.0)
MCV: 98.8 fL (ref 80.0–100.0)
Platelets: 219 10*3/uL (ref 150–400)
RBC: 4.17 MIL/uL — ABNORMAL LOW (ref 4.22–5.81)
RDW: 12.5 % (ref 11.5–15.5)
WBC: 7 10*3/uL (ref 4.0–10.5)
nRBC: 0 % (ref 0.0–0.2)

## 2020-12-07 LAB — BASIC METABOLIC PANEL
Anion gap: 6 (ref 5–15)
BUN: 25 mg/dL — ABNORMAL HIGH (ref 8–23)
CO2: 27 mmol/L (ref 22–32)
Calcium: 9.5 mg/dL (ref 8.9–10.3)
Chloride: 107 mmol/L (ref 98–111)
Creatinine, Ser: 1.24 mg/dL (ref 0.61–1.24)
GFR, Estimated: >60 mL/min (ref >60)
Glucose, Bld: 116 mg/dL — ABNORMAL HIGH (ref 70–99)
Potassium: 4.9 mmol/L (ref 3.5–5.1)
Sodium: 140 mmol/L (ref 135–145)

## 2020-12-07 LAB — SARS CORONAVIRUS 2 (TAT 6-24 HRS): SARS Coronavirus 2: NEGATIVE

## 2020-12-07 NOTE — Progress Notes (Signed)
PCP - Dr. Cathlean Sauer Cardiologist - Dr. Minus Breeding  PPM/ICD - [employer] Device Orders - in chart Rep Notified - yes, Leanna Sato notified with [employer]  Chest x-ray - 06/26/20 EKG - 06/26/20 Stress Test - denies ECHO - 03/15/2018 Cardiac Cath - 03/13/2018  Sleep Study - denies   DM- denies  Blood Thinner Instructions: Aspirin Instructions: pt instructed to call surgeon for instructions  ERAS Protcol - no, NPO  COVID TEST- 12/07/20 at PAT appt   Anesthesia review: yes, cardiac hx  Patient denies shortness of breath, fever, cough and chest pain at PAT appointment   All instructions explained to the patient, with a verbal understanding of the material. Patient agrees to go over the instructions while at home for a better understanding. Patient also instructed to wear a mask in public after being tested for COVID-19. The opportunity to ask questions was provided.

## 2020-12-07 NOTE — Progress Notes (Signed)
[employer] rep [place] contacted via Email and phone call to verify that he will need to be present on the day of surgery

## 2020-12-08 NOTE — Anesthesia Preprocedure Evaluation (Addendum)
Anesthesia Evaluation  Patient identified by MRN, date of birth, ID band Patient awake    Reviewed: Allergy & Precautions, NPO status , Patient's Chart, lab work & pertinent test results  History of Anesthesia Complications Negative for: history of anesthetic complications  Airway Mallampati: III  TM Distance: >3 FB Neck ROM: Full    Dental  (+) Dental Advisory Given, Teeth Intact   Pulmonary neg pulmonary ROS,    breath sounds clear to auscultation       Cardiovascular hypertension, Pt. on medications and Pt. on home beta blockers + CAD, + Past MI and + CABG  + dysrhythmias Atrial Fibrillation + pacemaker  Rhythm:Regular  The left ventricle has normal systolic function of 76-28%. The cavity  size was normal. There is concentric left ventricular hypertrophy. Echo  evidence of normal diastolic relaxation.  2. The right ventricle has moderately reduced systolic function. The  cavity was mildly enlarged. There is no increase in right ventricular wall  thickness.  3. The mitral valve is normal in structure. There is mild thickening.  There is mild mitral annular calcification present.  4. The tricuspid valve is normal in structure.  5. The aortic valve is tricuspid There is mild thickening and sclerosis  without any evidence of stenosis of the aortic valve.  6. There is mild dilatation of the aortic root.  7. The inferior vena cava was dilated in size with >50% respiratory  variability.  8. No evidence of left ventricular regional wall motion abnormalities.      Multivessel native coronary obstructive disease with 40 and 30% proximal LAD stenoses with competitive filling of the mid LAD via LIMA graft; small patent ramus intermediate vessel; 50 to 60% eccentric proximal left circumflex stenosis with smooth 70% ostial circumflex marginal stenosis with evidence for a very distal segment of the graft anastomosing into the mid OM  vessel without competitive filling suggesting graft occlusion; 80% and 95% proximal to mid native RCA stenoses with diffuse 60% mid stenosis followed by total occlusion.  There is evidence for left to right collateralization to the PDA and distal vessels via the left coronary injection.  Patent LIMA graft supplying the mid LAD.  Probable old occlusion of the vein graft which had supplied the obtuse marginal vessel.  Probable acutely occluded vein graft supplying the distal RCA with extensive thrombus throughout and TIMI 0 flow beyond the proximal third of the graft.  Mild LV dysfunction with an ejection fraction of 40 to 45% and evidence for focal basal inferior hypocontractility and focal apical to apical inferior hypocontractility.  LVEDP 19 mmHg.    Neuro/Psych negative neurological ROS  negative psych ROS   GI/Hepatic Neg liver ROS, GERD  Medicated and Controlled,  Endo/Other  negative endocrine ROS  Renal/GU negative Renal ROSLab Results      Component                Value               Date                      CREATININE               1.24                12/07/2020                Musculoskeletal   Abdominal   Peds  Hematology negative hematology ROS (+) Lab Results  Component                Value               Date                      WBC                      7.0                 12/07/2020                HGB                      13.4                12/07/2020                HCT                      41.2                12/07/2020                MCV                      98.8                12/07/2020                PLT                      219                 12/07/2020              Anesthesia Other Findings   Reproductive/Obstetrics                            Anesthesia Physical Anesthesia Plan  ASA: 3  Anesthesia Plan: General   Post-op Pain Management:    Induction: Intravenous  PONV Risk Score and Plan: 2 and Ondansetron  and Dexamethasone  Airway Management Planned: Oral ETT  Additional Equipment: None  Intra-op Plan:   Post-operative Plan: Extubation in OR  Informed Consent: I have reviewed the patients History and Physical, chart, labs and discussed the procedure including the risks, benefits and alternatives for the proposed anesthesia with the patient or authorized representative who has indicated his/her understanding and acceptance.     Dental advisory given  Plan Discussed with: CRNA and Anesthesiologist  Anesthesia Plan Comments: (PAT note written 12/08/2020 by Myra Gianotti, PA-C.   Pacemaker set to DOO 70bpm by rep preop )       Anesthesia Quick Evaluation

## 2020-12-08 NOTE — Progress Notes (Addendum)
Anesthesia Chart Review:  Case: [id number] Date/Time: 12/11/20 0815   Procedures:      RIGHT MODIFIED NECK DISSECTION (Right)     DIRECT LARYNGOSCOPY WITH BIOPSY     ESOPHAGOSCOPY     POSSIBLE BILATERAL TONSILLECTOMY (Bilateral)   Anesthesia type: General   Pre-op diagnosis: Neck mass; Esophageal dysphagia   Location: [facility] OR ROOM 06 / [place] [facility]   Surgeons: Izora Gala, MD       DISCUSSION: Patient is a 75 year old male scheduled for the above procedure.  He had a infected right axillary third more a few months ago.  He then developed a mass in his right neck.  Had also been having some difficulty swallowing and food getting stuck in his upper esophagus for about a year.  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 The above procedure recommended. He is s/p EGD 11/27/20 (see OTHER below).  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  Last visit with cardiologist Dr. Percival Spanish on 07/24/20.  No new CAD symptoms, so no further cardiovascular testing felt indicated.  Continue aggressive risk reduction and medications.  Recent pacemaker for bradycardia/Mobitz type II.  Toprol prescribed for intermittent tachy palpitations.  47-month follow-up planned. At that visit Plavix was marked as discontinued, although I didn't see it specifically mentioned in Dr. Rosezella Florida note. (He had been on DAPT due to extensive thrombus found in SVG-PDA graft during 2020 cath.) He is on  ASA 81 mg daily. Cardiology preference was to continue ASA for previous shoulder scope in 2021. By PAT RN note, patient unclear about recommendations for this procedure. He is to follow-up with Dr. Constance Holster, and I also left a message for his assistant Anderson Malta.  (UPDATE 12/08/20 4:29 PM: Patient does not have to hold ASA 81 mg daily for this surgery per Dr. Constance Holster.)  PPM Perioperative Rx: Device Type:  Pacemaker Manufacturer and Phone #:  Medtronic: [id number] Pacemaker Dependent?:  Yes.   Date of Last Device Check:  09/27/20 (remote)        Normal Device Function?:  Yes.     Electrophysiologist's Recommendations: Have magnet available. Provide continuous ECG monitoring when magnet is used or reprogramming is to be performed.  Device should be programmed prior to procedure due to interference.   Anesthesia team to evaluate on the day of surgery.  12/07/2020 presurgical COVID-19 test negative.   VS: BP 120/74   Pulse 67   Temp 36.7 C (Oral)   Resp 17   Ht 6' (1.829 m)   Wt 90.4 kg   SpO2 98%   BMI 27.04 kg/m    PROVIDERS: Cathlean Sauer, MD is PCP  Minus Breeding, MD is cardiologist Cristopher Peru, MD is EP cardiologist Tat, Wells Guiles, DO is neurologist.  Seen on 04/20/2020 for resting tremor felt to be essential tremor.  Medication not prescribed at that time as risk of medication felt to outweigh benefits given bradycardia history (did not have PPM at that time). As needed follow-up.   LABS: Labs reviewed: Acceptable for surgery. LFTs normal 04/05/20.  (  all labs ordered are listed, but only abnormal results are displayed)  Labs Reviewed  BASIC METABOLIC PANEL - Abnormal; Notable for the following components:      Result Value   Glucose, Bld 116 (*)    BUN 25 (*)    All other components within normal limits  CBC - Abnormal; Notable for the following components:   RBC 4.17 (*)    All other components within normal limits  SARS CORONAVIRUS 2 (TAT 6-24 HRS)    OTHER: EGD  11/27/20 ([facility]): Impression  - Dysphagia could be from acid reflux but is not likely from the small  peptic stricture.  Biopsies were obtained to exclude for EoE.  An  esophageal dysmotility has not been evaluated.  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  No definitive evidence of eosinophilic esophagitis or malignancy] Eat in small bites  Trial of omeprazole 40 mg once daily in AM     IMAGES: PET Scan 11/29/20: IMPRESSION: - Findings suspicious for neoplasm of the head and neck with RIGHT neck adenopathy. Asymmetric uptake at the level of the oropharyngeal tonsillar tissue in base of tongue greatest on the LEFT, correlation with direct visualization or cross-sectional imaging with intravenous contrast may be helpful. - Moderate increased metabolic activity associated with what is likely a lymph node at the RIGHT hilum. Nonspecific in a patient with evidence of prior granulomatous disease but suspicious given other findings. - Fatty area within posterior LEFT thigh amidst musculature tracking toward hamstring insertion. Perhaps this represents sequela of prior trauma with asymmetric atrophy. Given boundaries with other, adjacent musculature atypical lipomatous lesion is not entirely excluded, consider correlation with MRI if there is no prior imaging for comparison. - Aortic Atherosclerosis (ICD10-I70.0).  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 However,  there is slight thickening of the wall in multiple areas and a cystic metastasis could have a similar appearance. No visible primary lesion identified. Recommend ENT referral for further evaluation.  CXR 06/26/20  ([facility]): IMPRESSION: 1.  There is no evidence of acute cardiac or pulmonary abnormality.  2.  Intact cardiac pacing device with leads in the expected radiologic positions. No abandoned leads.  3.  Slight right basilar atelectasis.    EKG: 06/26/20: Atrial-sensed ventricular-paced rhythm Abnormal ECG Compared to previous tracing , pacing is new Confirmed by [place] ([id number]) on 06/26/2020 4:40:49 PM   CV: Cardiac cath 03/13/18: Ost LAD to Prox LAD lesion is 40% stenosed. Prox LAD lesion is 30% stenosed. Prox Cx lesion is 55% stenosed. Origin to Prox Graft lesion is 100% stenosed. Prox RCA lesion is 80% stenosed. Prox RCA to Mid RCA lesion is 95% stenosed. Mid RCA to Dist RCA lesion is 100% stenosed. Prox Graft to Dist Graft lesion is 100% stenosed. LV end diastolic pressure is mildly elevated. There is mild left ventricular systolic dysfunction. Ost 1st Mrg lesion is 70% stenosed.   Multivessel native coronary obstructive disease with 40 and 30% proximal LAD stenoses with competitive filling of the mid LAD via LIMA graft; small patent ramus intermediate vessel; 50 to 60% eccentric proximal left circumflex stenosis with smooth 70% ostial circumflex marginal stenosis with evidence for a very distal segment of the graft anastomosing into the mid  OM vessel without competitive filling suggesting graft occlusion; 80% and 95% proximal to mid native RCA stenoses with diffuse 60% mid stenosis followed by total occlusion.  There is evidence for left to right collateralization to the PDA and distal vessels via the left coronary injection.   Patent LIMA graft supplying the mid LAD.   Probable old occlusion of the vein graft which had supplied the obtuse marginal vessel.   Probable acutely occluded vein graft supplying the distal RCA with extensive thrombus throughout and TIMI 0 flow beyond the proximal third of the graft.   Mild LV dysfunction with an ejection fraction of 40 to 45% and  evidence for focal basal inferior hypocontractility and focal apical to apical inferior hypocontractility.  LVEDP 19 mmHg.   RECOMMENDATION: Medical therapy.  The patient's mild ST elevation inferiorly most likely is due to extensive thrombus in the occluded vein graft supplying the distal RCA.  The patient had been off aspirin for over a week.  Will initiate Plavix 300 mg which was given in the lab and 75 mg daily for dual antiplatelet therapy.  Recommend aggressive medical therapy for concomitant CAD.  Aggressive lipid-lowering therapy with target LDL less than 70.  Will obtain 2D echo Doppler study.  We will try to to obtain more specific records of his CABG revascularization.  Echo 03/15/18: IMPRESSIONS   1. The left ventricle has normal systolic function of 94-49%. The cavity  size was normal. There is concentric left ventricular hypertrophy. Echo  evidence of normal diastolic relaxation.   2. The right ventricle has moderately reduced systolic function. The  cavity was mildly enlarged. There is no increase in right ventricular wall  thickness.   3. The mitral valve is normal in structure. There is mild thickening.  There is mild mitral annular calcification present.   4. The tricuspid valve is normal in structure.   5. The aortic valve is tricuspid There is mild thickening and sclerosis  without any evidence of stenosis of the aortic valve.   6. There is mild dilatation of the aortic root.   7. The inferior vena cava was dilated in size with >50% respiratory  variability.   8. No evidence of left ventricular regional wall motion abnormalities.    Past Medical History:  Diagnosis Date   Atrial fibrillation ([place])    CAD (coronary artery disease)    CABG 2008; 03/13/2018 NSTEMI due to acute thrombotic occlusion of RCA graft, patent LIMA to LAD.   Chronic cough    GERD (gastroesophageal reflux disease)    Hx of adenomatous colonic polyps 04/04/2016   Hyperlipidemia    Hypertension     Myocardial infarct [facility])    Presence of permanent cardiac pacemaker    SVT (supraventricular tachycardia) ([place])     Past Surgical History:  Procedure Laterality Date   CORONARY ARTERY BYPASS GRAFT  03/2006   HERNIA REPAIR  1964   LEFT HEART CATH AND CORS/GRAFTS ANGIOGRAPHY N/A 03/13/2018   Procedure: LEFT HEART CATH AND CORS/GRAFTS ANGIOGRAPHY;  Surgeon: Troy Sine, MD;  Location: [place] CV LAB;  Service: Cardiovascular;  Laterality: N/A;   PACEMAKER IMPLANT N/A 06/26/2020   Procedure: PACEMAKER IMPLANT;  Surgeon: Evans Lance, MD;  Location: [place] CV LAB;  Service: Cardiovascular;  Laterality: N/A;   SUPRAVENTRICULAR TACHYCARDIA ABLATION N/A 09/03/2012   Procedure: SUPRAVENTRICULAR TACHYCARDIA ABLATION;  Surgeon: Evans Lance, MD;  Location: [facility] CATH LAB;  Service: Cardiovascular;  Laterality: N/A;    MEDICATIONS:  aspirin  81 MG chewable tablet   cholecalciferol (VITAMIN D3) 25 MCG (1000 UNIT) tablet   metoprolol succinate (TOPROL-XL) 50 MG 24 hr tablet   omeprazole (PRILOSEC) 40 MG capsule   OVER THE COUNTER MEDICATION   rosuvastatin (CRESTOR) 40 MG tablet   sildenafil (VIAGRA) 50 MG tablet   No current facility-administered medications for this encounter.  He is not taking Viagra.    Myra Gianotti, PA-C Surgical Short Stay/Anesthesiology [facility] Phone [phone] [facility] Phone [phone] 12/08/2020 11:07 AM

## 2020-12-11 ENCOUNTER — Encounter (HOSPITAL_COMMUNITY): Payer: Self-pay | Admitting: Otolaryngology

## 2020-12-11 ENCOUNTER — Inpatient Hospital Stay (HOSPITAL_COMMUNITY)
Admission: RE | Admit: 2020-12-11 | Discharge: 2020-12-13 | DRG: 822 | Disposition: A | Discharge status: Home / Self Care | Payer: Medicare (Managed Care) | Attending: Otolaryngology | Admitting: Otolaryngology

## 2020-12-11 ENCOUNTER — Encounter (HOSPITAL_COMMUNITY)
Admission: RE | Disposition: A | Discharge status: Home / Self Care | Payer: Self-pay | Source: Home / Self Care | Attending: Otolaryngology

## 2020-12-11 ENCOUNTER — Inpatient Hospital Stay (HOSPITAL_COMMUNITY): Payer: Medicare (Managed Care) | Admitting: Vascular Surgery

## 2020-12-11 ENCOUNTER — Other Ambulatory Visit: Payer: Self-pay

## 2020-12-11 ENCOUNTER — Inpatient Hospital Stay (HOSPITAL_COMMUNITY): Payer: Medicare (Managed Care) | Admitting: Certified Registered Nurse Anesthetist

## 2020-12-11 DIAGNOSIS — E785 Hyperlipidemia, unspecified: Secondary | ICD-10-CM | POA: Diagnosis present

## 2020-12-11 DIAGNOSIS — Z7902 Long term (current) use of antithrombotics/antiplatelets: Secondary | ICD-10-CM | POA: Diagnosis not present

## 2020-12-11 DIAGNOSIS — I10 Essential (primary) hypertension: Secondary | ICD-10-CM | POA: Diagnosis present

## 2020-12-11 DIAGNOSIS — Z79899 Other long term (current) drug therapy: Secondary | ICD-10-CM | POA: Diagnosis not present

## 2020-12-11 DIAGNOSIS — M6748 Ganglion, other site: Secondary | ICD-10-CM | POA: Diagnosis present

## 2020-12-11 DIAGNOSIS — Q180 Sinus, fistula and cyst of branchial cleft: Secondary | ICD-10-CM | POA: Diagnosis not present

## 2020-12-11 DIAGNOSIS — C770 Secondary and unspecified malignant neoplasm of lymph nodes of head, face and neck: Secondary | ICD-10-CM | POA: Diagnosis present

## 2020-12-11 DIAGNOSIS — Z7982 Long term (current) use of aspirin: Secondary | ICD-10-CM | POA: Diagnosis not present

## 2020-12-11 DIAGNOSIS — R1310 Dysphagia, unspecified: Secondary | ICD-10-CM | POA: Diagnosis present

## 2020-12-11 DIAGNOSIS — R221 Localized swelling, mass and lump, neck: Secondary | ICD-10-CM | POA: Diagnosis present

## 2020-12-11 DIAGNOSIS — C801 Malignant (primary) neoplasm, unspecified: Secondary | ICD-10-CM | POA: Diagnosis present

## 2020-12-11 HISTORY — PX: RADICAL NECK DISSECTION: SHX2284

## 2020-12-11 HISTORY — PX: ESOPHAGOSCOPY: SHX5534

## 2020-12-11 HISTORY — PX: DIRECT LARYNGOSCOPY: SHX5326

## 2020-12-11 HISTORY — PX: TONSILLECTOMY: SHX5217

## 2020-12-11 SURGERY — DISSECTION, NECK, RADICAL
Anesthesia: General | Laterality: Right

## 2020-12-11 MED ORDER — OXYCODONE HCL 5 MG/5ML PO SOLN: 5.0000 mg | Freq: Once | ORAL | Status: DC | PRN

## 2020-12-11 MED ORDER — PHENYLEPHRINE 40 MCG/ML (10ML) SYRINGE FOR IV PUSH (FOR BLOOD PRESSURE SUPPORT)
PREFILLED_SYRINGE | INTRAVENOUS | Status: DC | PRN
Start: 2020-12-11 — End: 2020-12-11
  Administered 2020-12-11: 160 ug via INTRAVENOUS
  Administered 2020-12-11 (×2): 120 ug via INTRAVENOUS

## 2020-12-11 MED ORDER — ONDANSETRON HCL 4 MG/2ML IJ SOLN
INTRAMUSCULAR | Status: DC | PRN
  Administered 2020-12-11: 4 mg via INTRAVENOUS

## 2020-12-11 MED ORDER — ORAL CARE MOUTH RINSE: 15.0000 mL | Freq: Once | OROMUCOSAL | Status: AC

## 2020-12-11 MED ORDER — LIDOCAINE-EPINEPHRINE 1 %-1:100000 IJ SOLN: INTRAMUSCULAR | Status: DC | PRN

## 2020-12-11 MED ORDER — ROCURONIUM BROMIDE 10 MG/ML (PF) SYRINGE
PREFILLED_SYRINGE | INTRAVENOUS | Status: DC | PRN
  Administered 2020-12-11: 30 mg via INTRAVENOUS
  Administered 2020-12-11: 20 mg via INTRAVENOUS
  Administered 2020-12-11: 70 mg via INTRAVENOUS
  Administered 2020-12-11: 20 mg via INTRAVENOUS

## 2020-12-11 MED ORDER — LIDOCAINE 2% (20 MG/ML) 5 ML SYRINGE
INTRAMUSCULAR | Status: DC | PRN
  Administered 2020-12-11: 60 mg via INTRAVENOUS

## 2020-12-11 MED ORDER — SUGAMMADEX SODIUM 200 MG/2ML IV SOLN
INTRAVENOUS | Status: DC | PRN
  Administered 2020-12-11: 200 mg via INTRAVENOUS

## 2020-12-11 MED ORDER — ONDANSETRON HCL 4 MG/2ML IJ SOLN: 4.0000 mg | INTRAMUSCULAR | Status: DC | PRN

## 2020-12-11 MED ORDER — BACITRACIN ZINC 500 UNIT/GM EX OINT
TOPICAL_OINTMENT | CUTANEOUS | Status: AC
  Filled 2020-12-11: qty 28.35

## 2020-12-11 MED ORDER — PROPOFOL 10 MG/ML IV BOLUS
INTRAVENOUS | Status: DC | PRN
  Administered 2020-12-11: 160 mg via INTRAVENOUS

## 2020-12-11 MED ORDER — DEXTROSE-NACL 5-0.9 % IV SOLN: INTRAVENOUS | Status: DC

## 2020-12-11 MED ORDER — PANTOPRAZOLE SODIUM 40 MG PO TBEC
40.0000 mg | DELAYED_RELEASE_TABLET | Freq: Every day | ORAL | Status: DC
  Administered 2020-12-11 – 2020-12-13 (×3): 40 mg via ORAL
  Filled 2020-12-11 (×3): qty 1

## 2020-12-11 MED ORDER — HYDROCODONE-ACETAMINOPHEN 7.5-325 MG/15ML PO SOLN
10.0000 mL | ORAL | Status: DC | PRN
  Administered 2020-12-11 – 2020-12-13 (×11): 15 mL via ORAL
  Filled 2020-12-11 (×11): qty 15

## 2020-12-11 MED ORDER — EPINEPHRINE HCL (NASAL) 0.1 % NA SOLN
NASAL | Status: AC
  Filled 2020-12-11: qty 30

## 2020-12-11 MED ORDER — IBUPROFEN 100 MG/5ML PO SUSP
400.0000 mg | Freq: Four times a day (QID) | ORAL | Status: DC | PRN
  Filled 2020-12-11: qty 20

## 2020-12-11 MED ORDER — METOPROLOL SUCCINATE ER 50 MG PO TB24
50.0000 mg | ORAL_TABLET | Freq: Every day | ORAL | Status: DC
  Administered 2020-12-12 – 2020-12-13 (×2): 50 mg via ORAL
  Filled 2020-12-11 (×3): qty 1

## 2020-12-11 MED ORDER — FENTANYL CITRATE (PF) 250 MCG/5ML IJ SOLN
INTRAMUSCULAR | Status: AC
  Filled 2020-12-11: qty 5

## 2020-12-11 MED ORDER — LACTATED RINGERS IV SOLN: INTRAVENOUS | Status: DC

## 2020-12-11 MED ORDER — MELATONIN 3 MG PO TABS
3.0000 mg | ORAL_TABLET | Freq: Every day | ORAL | Status: DC
  Administered 2020-12-12 (×2): 3 mg via ORAL
  Filled 2020-12-11 (×2): qty 1

## 2020-12-11 MED ORDER — FENTANYL CITRATE (PF) 100 MCG/2ML IJ SOLN
25.0000 ug | INTRAMUSCULAR | Status: DC | PRN
  Administered 2020-12-11 (×2): 50 ug via INTRAVENOUS

## 2020-12-11 MED ORDER — BACITRACIN ZINC 500 UNIT/GM EX OINT
TOPICAL_OINTMENT | CUTANEOUS | Status: DC | PRN
  Administered 2020-12-11: 1 via TOPICAL

## 2020-12-11 MED ORDER — CHLORHEXIDINE GLUCONATE 0.12 % MT SOLN
15.0000 mL | Freq: Once | OROMUCOSAL | Status: AC
  Administered 2020-12-11: 15 mL via OROMUCOSAL
  Filled 2020-12-11: qty 15

## 2020-12-11 MED ORDER — BACITRACIN ZINC 500 UNIT/GM EX OINT
1.0000 "application " | TOPICAL_OINTMENT | Freq: Three times a day (TID) | CUTANEOUS | Status: DC
  Administered 2020-12-11 – 2020-12-13 (×7): 1 via TOPICAL
  Filled 2020-12-11: qty 28.4

## 2020-12-11 MED ORDER — ACETAMINOPHEN 160 MG/5ML PO SOLN: 1000.0000 mg | Freq: Once | ORAL | Status: DC | PRN

## 2020-12-11 MED ORDER — ACETAMINOPHEN 10 MG/ML IV SOLN
1000.0000 mg | Freq: Once | INTRAVENOUS | Status: DC | PRN
  Administered 2020-12-11: 1000 mg via INTRAVENOUS

## 2020-12-11 MED ORDER — PHENYLEPHRINE HCL (PRESSORS) 10 MG/ML IV SOLN
INTRAVENOUS | Status: AC
  Filled 2020-12-11: qty 2

## 2020-12-11 MED ORDER — LIDOCAINE-EPINEPHRINE 1 %-1:100000 IJ SOLN
INTRAMUSCULAR | Status: AC
  Filled 2020-12-11: qty 1

## 2020-12-11 MED ORDER — PROPOFOL 10 MG/ML IV BOLUS
INTRAVENOUS | Status: AC
  Filled 2020-12-11: qty 20

## 2020-12-11 MED ORDER — FENTANYL CITRATE (PF) 100 MCG/2ML IJ SOLN
INTRAMUSCULAR | Status: AC
  Filled 2020-12-11: qty 2

## 2020-12-11 MED ORDER — METOPROLOL SUCCINATE ER 25 MG PO TB24: 50.0000 mg | ORAL_TABLET | Freq: Once | ORAL | Status: AC

## 2020-12-11 MED ORDER — FENTANYL CITRATE (PF) 250 MCG/5ML IJ SOLN
INTRAMUSCULAR | Status: DC | PRN
  Administered 2020-12-11 (×2): 50 ug via INTRAVENOUS
  Administered 2020-12-11: 100 ug via INTRAVENOUS
  Administered 2020-12-11: 50 ug via INTRAVENOUS

## 2020-12-11 MED ORDER — METOPROLOL SUCCINATE ER 25 MG PO TB24
ORAL_TABLET | ORAL | Status: AC
  Administered 2020-12-11: 50 mg via ORAL
  Filled 2020-12-11: qty 2

## 2020-12-11 MED ORDER — ACETAMINOPHEN 500 MG PO TABS: 1000.0000 mg | ORAL_TABLET | Freq: Once | ORAL | Status: DC | PRN

## 2020-12-11 MED ORDER — EPINEPHRINE PF 1 MG/ML IJ SOLN
INTRAMUSCULAR | Status: AC
  Filled 2020-12-11: qty 1

## 2020-12-11 MED ORDER — ROSUVASTATIN CALCIUM 20 MG PO TABS
40.0000 mg | ORAL_TABLET | Freq: Every day | ORAL | Status: DC
  Administered 2020-12-11 – 2020-12-13 (×3): 40 mg via ORAL
  Filled 2020-12-11 (×3): qty 2

## 2020-12-11 MED ORDER — VITAMIN D 25 MCG (1000 UNIT) PO TABS
1000.0000 [IU] | ORAL_TABLET | ORAL | Status: DC
  Administered 2020-12-12: 1000 [IU] via ORAL
  Filled 2020-12-11 (×3): qty 1

## 2020-12-11 MED ORDER — 0.9 % SODIUM CHLORIDE (POUR BTL) OPTIME
TOPICAL | Status: DC | PRN
  Administered 2020-12-11 (×2): 1000 mL

## 2020-12-11 MED ORDER — ACETAMINOPHEN 10 MG/ML IV SOLN
INTRAVENOUS | Status: AC
  Filled 2020-12-11: qty 100

## 2020-12-11 MED ORDER — ASPIRIN 81 MG PO CHEW
81.0000 mg | CHEWABLE_TABLET | Freq: Every day | ORAL | Status: DC
  Administered 2020-12-11 – 2020-12-13 (×3): 81 mg via ORAL
  Filled 2020-12-11 (×3): qty 1

## 2020-12-11 MED ORDER — PHENYLEPHRINE HCL-NACL 20-0.9 MG/250ML-% IV SOLN
INTRAVENOUS | Status: DC | PRN
  Administered 2020-12-11: 15 ug/min via INTRAVENOUS

## 2020-12-11 MED ORDER — DEXAMETHASONE SODIUM PHOSPHATE 10 MG/ML IJ SOLN
INTRAMUSCULAR | Status: DC | PRN
  Administered 2020-12-11: 10 mg via INTRAVENOUS

## 2020-12-11 MED ORDER — SILDENAFIL CITRATE 50 MG PO TABS: 50.0000 mg | ORAL_TABLET | Freq: Every day | ORAL | Status: DC | PRN

## 2020-12-11 MED ORDER — ONDANSETRON HCL 4 MG PO TABS: 4.0000 mg | ORAL_TABLET | ORAL | Status: DC | PRN

## 2020-12-11 MED ORDER — OXYCODONE HCL 5 MG PO TABS: 5.0000 mg | ORAL_TABLET | Freq: Once | ORAL | Status: DC | PRN

## 2020-12-11 SURGICAL SUPPLY — 72 items
APPLIER CLIP 9.375 SM OPEN (CLIP)
BAG COUNTER SPONGE SURGICOUNT (BAG) ×4 IMPLANT
BAG SURGICOUNT SPONGE COUNTING (BAG) ×1
BLADE SURG 10 STRL SS (BLADE) ×5 IMPLANT
BLADE SURG 15 STRL LF DISP TIS (BLADE) IMPLANT
BLADE SURG 15 STRL SS (BLADE)
CANISTER SUCT 3000ML PPV (MISCELLANEOUS) ×5 IMPLANT
CATH ROBINSON RED A/P 10FR (CATHETERS) ×5 IMPLANT
CLEANER TIP ELECTROSURG 2X2 (MISCELLANEOUS) ×10 IMPLANT
CLIP APPLIE 9.375 SM OPEN (CLIP) IMPLANT
CNTNR URN SCR LID CUP LEK RST (MISCELLANEOUS) ×12 IMPLANT
COAGULATOR SUCT 6 FR SWTCH (ELECTROSURGICAL) ×1
COAGULATOR SUCT SWTCH 10FR 6 (ELECTROSURGICAL) ×4 IMPLANT
CONT SPEC 4OZ STRL OR WHT (MISCELLANEOUS) ×20
CORD BIPOLAR FORCEPS 12FT (ELECTRODE) ×5 IMPLANT
COVER BACK TABLE 60X90IN (DRAPES) ×5 IMPLANT
COVER MAYO STAND STRL (DRAPES) ×5 IMPLANT
COVER SURGICAL LIGHT HANDLE (MISCELLANEOUS) ×5 IMPLANT
DRAIN CHANNEL 15F RND FF W/TCR (WOUND CARE) ×5 IMPLANT
DRAPE HALF SHEET 40X57 (DRAPES) ×5 IMPLANT
DRAPE INCISE 23X17 IOBAN STRL (DRAPES)
DRAPE INCISE IOBAN 23X17 STRL (DRAPES) IMPLANT
ELECT COATED BLADE 2.86 ST (ELECTRODE) ×10 IMPLANT
ELECT REM PT RETURN 9FT ADLT (ELECTROSURGICAL) ×5
ELECTRODE REM PT RTRN 9FT ADLT (ELECTROSURGICAL) ×3 IMPLANT
EVACUATOR SILICONE 100CC (DRAIN) ×5 IMPLANT
FORCEPS BIPOLAR SPETZLER 8 1.0 (NEUROSURGERY SUPPLIES) ×5 IMPLANT
GAUZE 4X4 16PLY ~~LOC~~+RFID DBL (SPONGE) ×15 IMPLANT
GLOVE BIOGEL PI IND STRL 7.5 (GLOVE) ×6 IMPLANT
GLOVE BIOGEL PI INDICATOR 7.5 (GLOVE) ×4
GLOVE SURG ENC MOIS LTX SZ6.5 (GLOVE) ×5 IMPLANT
GLOVE SURG ENC MOIS LTX SZ7 (GLOVE) ×20 IMPLANT
GLOVE SURG LTX SZ7.5 (GLOVE) ×5 IMPLANT
GOWN STRL REUS W/ TWL LRG LVL3 (GOWN DISPOSABLE) ×6 IMPLANT
GOWN STRL REUS W/TWL LRG LVL3 (GOWN DISPOSABLE) ×10
GUARD TEETH (MISCELLANEOUS) ×5 IMPLANT
KIT BASIN OR (CUSTOM PROCEDURE TRAY) ×5 IMPLANT
KIT TURNOVER KIT B (KITS) ×5 IMPLANT
LOCATOR NERVE 3 VOLT (DISPOSABLE) IMPLANT
NEEDLE PRECISIONGLIDE 27X1.5 (NEEDLE) ×5 IMPLANT
NS IRRIG 1000ML POUR BTL (IV SOLUTION) ×5 IMPLANT
PAD ARMBOARD 7.5X6 YLW CONV (MISCELLANEOUS) ×10 IMPLANT
PENCIL FOOT CONTROL (ELECTRODE) ×10 IMPLANT
SHEARS HARMONIC 9CM CVD (BLADE) ×5 IMPLANT
SOL ANTI FOG 6CC (MISCELLANEOUS) ×3 IMPLANT
SOLUTION ANTI FOG 6CC (MISCELLANEOUS) ×2
SPECIMEN JAR MEDIUM (MISCELLANEOUS) IMPLANT
SPONGE INTESTINAL PEANUT (DISPOSABLE) ×5 IMPLANT
SPONGE T-LAP 18X18 ~~LOC~~+RFID (SPONGE) IMPLANT
SPONGE TONSIL TAPE 1 RFD (DISPOSABLE) ×5 IMPLANT
STAPLER VISISTAT 35W (STAPLE) ×5 IMPLANT
SUT CHROMIC 3 0 SH 27 (SUTURE) ×10 IMPLANT
SUT ETHILON 3 0 PS 1 (SUTURE) ×5 IMPLANT
SUT ETHILON 5 0 PS 2 18 (SUTURE) IMPLANT
SUT SILK 2 0 REEL (SUTURE) IMPLANT
SUT SILK 3 0 SH CR/8 (SUTURE) ×5 IMPLANT
SUT SILK 4 0 RB 1 (SUTURE) ×5 IMPLANT
SUT SILK 4 0 REEL (SUTURE) ×10 IMPLANT
SUT VIC AB 3-0 SH 18 (SUTURE) IMPLANT
SYR BULB EAR ULCER 3OZ GRN STR (SYRINGE) ×5 IMPLANT
SYR CONTROL 10ML LL (SYRINGE) IMPLANT
TOWEL GREEN STERILE (TOWEL DISPOSABLE) ×5 IMPLANT
TOWEL GREEN STERILE FF (TOWEL DISPOSABLE) ×10 IMPLANT
TRAY ENT MC OR (CUSTOM PROCEDURE TRAY) ×5 IMPLANT
TRAY FOLEY MTR SLVR 14FR STAT (SET/KITS/TRAYS/PACK) IMPLANT
TUBE CONNECTING 12'X1/4 (SUCTIONS) ×2
TUBE CONNECTING 12X1/4 (SUCTIONS) ×8 IMPLANT
TUBE FEEDING 10FR FLEXIFLO (MISCELLANEOUS) IMPLANT
TUBE SALEM SUMP 10F W/ARV (TUBING) ×5 IMPLANT
TUBE SALEM SUMP 12R W/ARV (TUBING) IMPLANT
TUBE SALEM SUMP 14F W/ARV (TUBING) IMPLANT
TUBE SALEM SUMP 16 FR W/ARV (TUBING) IMPLANT

## 2020-12-11 NOTE — Transfer of Care (Signed)
Immediate Anesthesia Transfer of Care Note  Patient: Steven Mathews  Procedure(s) Performed: RIGHT MODIFIED NECK DISSECTION (Right) DIRECT LARYNGOSCOPY WITH BIOPSY ESOPHAGOSCOPY BILATERAL TONSILLECTOMY (Bilateral)  Patient Location: PACU  Anesthesia Type:General  Level of Consciousness: awake, alert  and oriented  Airway & Oxygen Therapy: Patient Spontanous Breathing and Patient connected to face mask oxygen  Post-op Assessment: Report given to RN and Post -op Vital signs reviewed and stable  Post vital signs: Reviewed and stable  Last Vitals:  Vitals Value Taken Time  BP 126/86 12/11/20 1219  Temp    Pulse 69 12/11/20 1222  Resp 17 12/11/20 1222  SpO2 96 % 12/11/20 1222  Vitals shown include unvalidated device data.  Last Pain:  Vitals:   12/11/20 0702  TempSrc:   PainSc: 0-No pain         Complications: No notable events documented.

## 2020-12-11 NOTE — Plan of Care (Signed)
  Problem: Clinical Measurements: Goal: Will remain free from infection Outcome: Progressing   Problem: Activity: Goal: Risk for activity intolerance will decrease Outcome: Progressing   Problem: Pain Managment: Goal: General experience of comfort will improve Outcome: Progressing   

## 2020-12-11 NOTE — Progress Notes (Signed)
Subjective: Doing well.  No complaints.  Objective: Vital signs in last 24 hours: Temp:  [97.4 F (36.3 C)-98.4 F (36.9 C)] 97.4 F (36.3 C) (11/07 1422) Pulse Rate:  [57-87] 62 (11/07 1422) Resp:  [11-19] 15 (11/07 1422) BP: (118-150)/(58-86) 124/71 (11/07 1422) SpO2:  [92 %-97 %] 96 % (11/07 1422) Weight:  [89.4 kg] 89.4 kg (11/07 0650) Wt Readings from Last 1 Encounters:  12/11/20 89.4 kg    Intake/Output from previous day: No intake/output data recorded. Intake/Output this shift: No intake/output data recorded.  General appearance: alert, cooperative, and no distress Neck: right neck incision clean and intact, no fluid collection, drain functioning, good CN XI and XII function, right lower lip very slightly weak  No results for input(s): WBC, HGB, HCT, PLT in the last 72 hours.  No results for input(s): NA, K, CL, CO2, GLUCOSE, BUN, CREATININE, CALCIUM in the last 72 hours.  Medications: I have reviewed the patient's current medications.  Assessment/Plan: S/p right neck dissection  Looks good.  Drain functioning.   LOS: 0 days   Melida Quitter 12/11/2020, 7:35 PM

## 2020-12-11 NOTE — Anesthesia Procedure Notes (Signed)
Procedure Name: Intubation Date/Time: 12/11/2020 8:40 AM Performed by: Griffin Dakin, CRNA Pre-anesthesia Checklist: Patient identified, Emergency Drugs available, Suction available and Patient being monitored Patient Re-evaluated:Patient Re-evaluated prior to induction Oxygen Delivery Method: Circle system utilized Preoxygenation: Pre-oxygenation with 100% oxygen Induction Type: IV induction Ventilation: Mask ventilation without difficulty and Oral airway inserted - appropriate to patient size Laryngoscope Size: Mac and 4 Grade View: Grade II Tube type: Oral Tube size: 7.5 mm Number of attempts: 1 Airway Equipment and Method: Stylet and Oral airway Placement Confirmation: ETT inserted through vocal cords under direct vision, positive ETCO2 and breath sounds checked- equal and bilateral Secured at: 23 cm Tube secured with: Tape Dental Injury: Teeth and Oropharynx as per pre-operative assessment

## 2020-12-11 NOTE — Op Note (Signed)
OPERATIVE REPORT  DATE OF SURGERY: 12/11/2020  PATIENT:  Steven Mathews,  75 y.o. male  PRE-OPERATIVE DIAGNOSIS: Metastatic carcinoma right neck  POST-OPERATIVE DIAGNOSIS: Metastatic carcinoma right neck  PROCEDURE:  Procedure(s): RIGHT MODIFIED NECK DISSECTION DIRECT LARYNGOSCOPY WITH BIOPSY ESOPHAGOSCOPY BILATERAL TONSILLECTOMY  SURGEON:  Beckie Salts, MD  ASSISTANTS: RNFA  ANESTHESIA:   General   EBL: 150 ml  DRAINS: 15 French round JP  LOCAL MEDICATIONS USED:  None  SPECIMEN: 1.  Right tonsil, frozen section negative for carcinoma 2.  Left tonsil frozen section negative for carcinoma. 3.  Right tongue base frozen section negative for carcinoma 4.  Right tongue base for routine pathology. 5.  Left tongue base for routine pathology. 6.  Right modified neck dissection levels 1 through 4, oriented on a towel.  COUNTS:  Correct  PROCEDURE DETAILS: The patient was taken to the operating room and placed on the operating table in the supine position. Following induction of general endotracheal anesthesia, the table was turned 90 degrees and the patient was draped in the standard fashion for upper endoscopy and possible tonsillectomy.  1.  Direct laryngoscopy with biopsy.  The Dedo laryngoscope was used to evaluate the larynx the hypopharynx and the oropharynx.  There were no abnormal findings noted.  There is a slightly hypertrophic mucosa in the right lateral tongue base just inferior to the tonsil.  Biopsy was taken for this and sent for frozen section.  When that came back negative bilateral tongue base biopsies were then performed more medially and were sent for pathologic evaluation with routine pathology.  Suction cautery was used for hemostasis.  2.  Esophagoscopy.  The rigid cervical esophagoscope was passed through the oral cavity and pharynx and into the esophagus.  It was slowly withdrawn while suctioning secretions and inspection of the esophageal wall did not reveal  any abnormal findings.  3.  Tonsillectomy.  A mouthgag was then inserted and used to retract the tongue and mandible and attached to the Mayo stand.  A red rubber catheter was inserted into the right side of the nose withdrawn through the nose and used to retract the soft palate and uvula.  Both tonsils were normal by appearance and by palpation but there is extensive tonsil lithiasis present bilaterally.  Tonsillectomies were performed using electrocautery dissection carefully dissecting the relatively avascular plane between the capsule and constrictor muscles.  Suction cautery was used for completion of hemostasis.  The tonsils were sent separately and labeled right and left tonsil for frozen section analysis.  Pharynx was suctioned blood and secretions irrigated with saline.  4.  Right modified neck dissection.  The table was turned back to its normal position and the right neck was prepped and draped in standard fashion.  A right Hemi apron incision was outlined with a marking pen.  Electrocautery was used to incise the skin and subcutaneous tissue and through the platysma layer.  Subplatysmal flaps were developed superiorly to the mandible and inferiorly to the clavicle.  Stay sutures were used on the upper flap with moist Ray-Tec's to facilitate exposure.  Soft tissue underlying the mandible was then carefully dissected exposing the marginal branch of the facial nerve which was reflected superiorly.  Facial vessels were then identified separately ligated between clamps and divided.  4-0 silk ties were used throughout the case.  The submandibular gland was brought inferiorly.  Mylohyoid was exposed and reflected anteriorly.  The submandibular ganglion was identified and ligated between clamps and divided.  The submandibular  duct and the lingual artery was treated in similar fashion.  The gland was brought inferiorly.  Submental fat was then dissected and brought posteriorly.  The external jugular vein and  the great auricular nerve were identified and preserved.  The fibrofatty tissue just anterior to that was dissected anteriorly off the sternocleidomastoid muscle.  The level 2 node was deep to the muscle and a cuff of SCM muscle was used as margin.  The remainder of the muscle was preserved.  Dissection continued anteriorly exposing the digastric muscle and the spinal accessory nerve.  Accessory nerve was dissected free of surrounding tissue.  The lymph node bearing tissue posterior superior to the nerve was dissected down to the fascia of the splenius capitis and the levator scapular muscles and then brought under the nerve brought forward.  There was some nodes inferior to this adjacent to the cervical plexus but the cervical plexus was mostly preserved.  Going inferiorly the omohyoid muscle was resected since there was a couple of nodes in this area that needed to be making with the specimen.  There is a single lymphatic duct identified which was ligated and divided.  This was all brought forward exposing the internal jugular vein and the carotid system and vagus.  The carotid system and vagus were preserved.  The vein was preserved as well.  Dissection was then continued anteriorly cleaning off the vein.  Small tear occurred in the vein and this was oversewed using a running 4-0 silk tie.  Specimen was then dissected off the strap muscles and oriented and delivered for pathologic evaluation.  The wound was irrigated with saline.  Hypoglossal nerve was preserved.  Valsalva maneuver was used to assess for hemostasis and for any evidence of chyle leak and there was none.  Drain was exited through separate stab incision secured in place with nylon suture.  The platysma layer was reapproximated with interrupted 3-0 chromic and staples were used on the skin.  Drain was charged.  Patient was awakened extubated and transferred to recovery in stable condition.   PATIENT DISPOSITION:  To PACU, stable

## 2020-12-11 NOTE — Interval H&P Note (Signed)
History and Physical Interval Note:  12/11/2020 7:54 AM  Steven Mathews  has presented today for surgery, with the diagnosis of Neck mass; Esophageal dysphagia.  The various methods of treatment have been discussed with the patient and family. After consideration of risks, benefits and other options for treatment, the patient has consented to  Procedure(s): RIGHT MODIFIED NECK DISSECTION (Right) DIRECT LARYNGOSCOPY WITH BIOPSY (N/A) ESOPHAGOSCOPY (N/A) POSSIBLE BILATERAL TONSILLECTOMY (Bilateral) as a surgical intervention.  The patient's history has been reviewed, patient examined, no change in status, stable for surgery.  I have reviewed the patient's chart and labs.  Questions were answered to the patient's satisfaction.     Izora Gala

## 2020-12-12 ENCOUNTER — Inpatient Hospital Stay (HOSPITAL_COMMUNITY): Payer: Medicare (Managed Care)

## 2020-12-12 ENCOUNTER — Encounter (HOSPITAL_COMMUNITY): Payer: Self-pay | Admitting: Otolaryngology

## 2020-12-12 MED ORDER — IOHEXOL 350 MG/ML SOLN
60.0000 mL | Freq: Once | INTRAVENOUS | Status: AC | PRN
  Administered 2020-12-12: 60 mL via INTRAVENOUS

## 2020-12-12 NOTE — Progress Notes (Signed)
Patient ID: Steven Mathews, male   DOB: 1945-06-02, 75 y.o.   MRN: [id number] Subjective: Overall doing very well.  He is taking oral liquids nicely.  His wife is in the room.  She brought him some ice cream.  He has been up walking.  He has been voiding well.  Pain is controlled well with the liquid hydrocodone.  Objective: Vital signs in last 24 hours: Temp:  [97.4 F (36.3 C)-98 F (36.7 C)] 97.9 F (36.6 C) (11/07 2122) Pulse Rate:  [57-87] 74 (11/07 2122) Resp:  [11-19] 18 (11/07 2122) BP: (118-150)/(58-86) 128/67 (11/07 2122) SpO2:  [92 %-97 %] 96 % (11/07 2122) Weight change:     Intake/Output from previous day: 11/07 0701 - 11/08 0700 In: 2032.7 [I.V.:2032.7] Out: 420 [Urine:175; Drains:145; Blood:100] Intake/Output this shift: No intake/output data recorded.  PHYSICAL EXAM: Awake and alert, breathing and voice are clear.  Incision looks excellent.  No swelling or hematoma.  Drain is functioning.  No bleeding from the pharynx.  Lab Results: No results for input(s): WBC, HGB, HCT, PLT in the last 72 hours. BMET No results for input(s): NA, K, CL, CO2, GLUCOSE, BUN, CREATININE, CALCIUM in the last 72 hours.  Studies/Results: No results found.  Medications: I have reviewed the patient's current medications.  Assessment/Plan: Postop day 1, neck dissection, tonsillectomy and endoscopy with biopsies.  Doing very well.  I showed his PET scan to thoracic surgery and they recommended we do a CT of the chest with contrast to evaluate the hilar mass that may or may not be present.  We will try to schedule this while he is in the hospital.  We will consider discharge in a day or 2 either with the drain or without the drain.  Continue with soft diet and with ambulation.  LOS: 1 day   Izora Gala 12/12/2020, 8:59 AM

## 2020-12-13 MED ORDER — HYDROCODONE-ACETAMINOPHEN 7.5-325 MG/15ML PO SOLN: 15.0000 mL | Freq: Four times a day (QID) | ORAL | 0 refills | Status: DC | PRN

## 2020-12-13 NOTE — Discharge Instructions (Signed)
Drink lots of fluids.  Eat what you are able to mainly soft foods.  Avoid any strenuous activity for 3 weeks.  Keep the drain site clean and dry.

## 2020-12-13 NOTE — Plan of Care (Signed)

## 2020-12-13 NOTE — Anesthesia Postprocedure Evaluation (Signed)
Anesthesia Post Note  Patient: Magdaleno Lortie  Procedure(s) Performed: RIGHT MODIFIED NECK DISSECTION (Right) DIRECT LARYNGOSCOPY WITH BIOPSY ESOPHAGOSCOPY BILATERAL TONSILLECTOMY (Bilateral)     Patient location during evaluation: PACU Anesthesia Type: General Level of consciousness: awake and alert Pain management: pain level controlled Vital Signs Assessment: post-procedure vital signs reviewed and stable Respiratory status: spontaneous breathing, nonlabored ventilation, respiratory function stable and patient connected to nasal cannula oxygen Cardiovascular status: blood pressure returned to baseline and stable Postop Assessment: no apparent nausea or vomiting Anesthetic complications: no   No notable events documented.  Last Vitals:  Vitals:   12/13/20 0449 12/13/20 0746  BP: 118/65 127/74  Pulse: 68 64  Resp: 18 17  Temp: 36.6 C 36.4 C  SpO2: 98% 97%    Last Pain:  Vitals:   12/13/20 0900  TempSrc:   PainSc: 4                  Kirill Chatterjee

## 2020-12-13 NOTE — Discharge Summary (Signed)
Physician Discharge Summary  Patient ID: Steven Mathews MRN: [id number] DOB/AGE: 1945/10/11 75 y.o.  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 rate 17, height 6' (1.829 m), weight 89.4 kg, SpO2 97 %. PHYSICAL EXAM: Awake and alert, breathing well.  No bleeding.  Neck looks excellent.  Disposition: Discharge disposition: 01-Home or Self Care       Discharge Instructions     Diet - low sodium heart healthy   Complete by: As directed    Discharge wound care:   Complete by: As directed    Apply bacitracin ointment twice daily to the staples and the drain site.  Empty drain 3 times daily and record the amount.   Increase activity slowly   Complete by: As directed       Allergies as of 12/13/2020   No Known Allergies      Medication List     TAKE these medications    aspirin 81 MG chewable tablet Chew 1 tablet (81 mg total) by mouth daily.   cholecalciferol 25 MCG (1000 UNIT) tablet Commonly known as: VITAMIN D3 Take 1,000 Units by mouth once a week.   HYDROcodone-acetaminophen 7.5-325 mg/15 ml solution Commonly known as: HYCET Take 15 mLs by mouth every 6 (six) hours as needed for moderate pain.   metoprolol succinate 50 MG 24 hr tablet Commonly known as: TOPROL-XL Take 1 tablet (50 mg total) by mouth daily. Take with or immediately following a meal.   omeprazole 40 MG capsule Commonly known as: PRILOSEC Take 40 mg by mouth daily.   OVER THE COUNTER MEDICATION Take 2 tablets by mouth at bedtime  as needed (sleep). Relaxium Sleep   rosuvastatin 40 MG tablet Commonly known as: CRESTOR Take 1 tablet (40 mg total) by mouth daily.   sildenafil 50 MG tablet Commonly known as: Viagra Take 1 tablet (50 mg total) by mouth daily as needed for erectile dysfunction.               Discharge Care Instructions  (From admission, onward)           Start     Ordered   12/13/20 0000  Discharge wound care:       Comments: Apply bacitracin ointment twice daily to the staples and the drain site.  Empty drain 3 times daily and record the amount.   12/13/20 1115            Follow-up Information     Izora Gala, MD Follow up on 12/15/2020.   Specialty: Otolaryngology Why: come to the office at 1 PM, unless the output of the drain is still high. Contact information: [address] [place] [address] [phone]                 Signed: Izora Gala 12/13/2020, 11:16 AM

## 2020-12-19 ENCOUNTER — Other Ambulatory Visit: Payer: Self-pay

## 2020-12-19 ENCOUNTER — Encounter: Payer: Self-pay | Admitting: Internal Medicine

## 2020-12-19 ENCOUNTER — Ambulatory Visit (INDEPENDENT_AMBULATORY_CARE_PROVIDER_SITE_OTHER): Payer: Medicare (Managed Care) | Admitting: Internal Medicine

## 2020-12-19 VITALS — BP 124/72 | HR 93 | Ht 72.0 in | Wt 194.6 lb

## 2020-12-19 DIAGNOSIS — Z950 Presence of cardiac pacemaker: Secondary | ICD-10-CM

## 2020-12-19 DIAGNOSIS — I471 Supraventricular tachycardia: Secondary | ICD-10-CM

## 2020-12-19 DIAGNOSIS — I459 Conduction disorder, unspecified: Secondary | ICD-10-CM

## 2020-12-19 NOTE — Patient Instructions (Addendum)
Medication Instructions:  Your physician recommends that you continue on your current medications as directed. Please refer to the Current Medication list given to you today.  Labwork: None ordered.  Testing/Procedures: None ordered.  Follow-Up: Your physician wants you to follow-up in: one year with Cristopher Peru, MD  Remote monitoring is used to monitor your Pacemaker from home. This monitoring reduces the number of office visits required to check your device to one time per year. It allows [place] to keep an eye on the functioning of your device to ensure it is working properly. You are scheduled for a device check from home on 12/26/2020. You may send your transmission at any time that day. If you have a wireless device, the transmission will be sent automatically. After your physician reviews your transmission, you will receive a postcard with your next transmission date.  Any Other Special Instructions Will Be Listed Below (If Applicable).  If you need a refill on your cardiac medications before your next appointment, please call your pharmacy.

## 2020-12-19 NOTE — Progress Notes (Signed)
HPI Steven Mathews returns today for followup. He is a pleasant 75 yo man with CAD, s/p CABG, HTN, and dyslipidemia. He had SVT and underwent ablation but had recurrence. 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 He has undergone DDD PM insertion. 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 He has undergone right neck surgery and is pending XRT.   No Known Allergies   Current Outpatient Medications  Medication Sig Dispense Refill   aspirin 81 MG chewable tablet Chew 1 tablet (81 mg total) by mouth daily. 90 tablet 3   cholecalciferol (VITAMIN D3) 25 MCG (1000 UNIT) tablet Take 1,000 Units by mouth once a week.     HYDROcodone-acetaminophen (HYCET) 7.5-325 mg/15 ml solution Take 15 mLs by mouth every 6 (six) hours as needed for moderate pain. 420 mL 0   omeprazole (PRILOSEC) 40 MG capsule Take 40 mg by mouth daily.     OVER THE COUNTER MEDICATION Take 2 tablets by mouth at bedtime as needed (sleep). Relaxium Sleep     sildenafil (VIAGRA) 50 MG tablet Take 1 tablet (50 mg total) by mouth daily as needed for erectile dysfunction. 20 tablet 3   metoprolol succinate (TOPROL-XL) 50 MG 24 hr tablet Take 1 tablet (50 mg total) by mouth daily. Take with or immediately following a meal. 90 tablet 3   rosuvastatin (CRESTOR) 40 MG tablet Take 1 tablet (40 mg total) by mouth daily. 90 tablet 3   No current facility-administered medications for this visit.     Past Medical History:  Diagnosis Date   Atrial fibrillation ([facility])    CAD (coronary artery disease)    CABG 2008; 03/13/2018 NSTEMI due to acute thrombotic occlusion of RCA graft, patent LIMA to LAD.   Chronic cough    GERD (gastroesophageal reflux disease)    Hx of adenomatous colonic polyps 04/04/2016   Hyperlipidemia    Hypertension    Myocardial infarct ([place])    Presence of permanent cardiac pacemaker    SVT  (supraventricular tachycardia) ([facility])     ROS:   All systems reviewed and negative except as noted in the HPI.   Past Surgical History:  Procedure Laterality Date   CORONARY ARTERY BYPASS GRAFT  03/2006   DIRECT LARYNGOSCOPY N/A 12/11/2020   Procedure: DIRECT LARYNGOSCOPY WITH BIOPSY;  Surgeon: Izora Gala, MD;  Location: [facility];  Service: ENT;  Laterality: N/A;   ESOPHAGOSCOPY N/A 12/11/2020   Procedure: ESOPHAGOSCOPY;  Surgeon: Izora Gala, MD;  Location: [facility] OR;  Service: ENT;  Laterality: N/A;   [place] CATH AND CORS/GRAFTS ANGIOGRAPHY N/A 03/13/2018   Procedure: LEFT HEART CATH AND CORS/GRAFTS ANGIOGRAPHY;  Surgeon: Troy Sine, MD;  Location: [facility] CV LAB;  Service: Cardiovascular;  Laterality: N/A;   PACEMAKER IMPLANT N/A 06/26/2020   Procedure: PACEMAKER IMPLANT;  Surgeon: Evans Lance, MD;  Location: [facility] CV LAB;  Service: Cardiovascular;  Laterality: N/A;   RADICAL NECK DISSECTION Right 12/11/2020   Procedure: RIGHT MODIFIED NECK DISSECTION;  Surgeon: Izora Gala, MD;  Location: [facility];  Service: ENT;  Laterality: Right;   SUPRAVENTRICULAR TACHYCARDIA ABLATION N/A 09/03/2012   Procedure: SUPRAVENTRICULAR TACHYCARDIA ABLATION;  Surgeon: Evans Lance, MD;  Location: [facility] CATH LAB;  Service: Cardiovascular;  Laterality: N/A;   TONSILLECTOMY Bilateral 12/11/2020   Procedure: BILATERAL TONSILLECTOMY;  Surgeon: Izora Gala, MD;  Location: [facility] OR;  Service: ENT;  Laterality: Bilateral;     Family History  Problem Relation Age of Onset   Liver cancer Father    Cancer Mother        bladder cancer   Colon cancer Brother 59     Social History   Socioeconomic History   Marital status: Married    Spouse name: Not on file   Number of children: Not on file   Years of education: Not on file   Highest education level: Not on file  Occupational History   Not on file  Tobacco Use   Smoking status: Never   Smokeless tobacco: Never   Vaping Use   Vaping Use: Never used  Substance and Sexual Activity   Alcohol use: Yes    Comment: 2-3 glasses wine/day   Drug use: No   Sexual activity: Not on file  Other Topics Concern   Not on file  Social History Narrative   Right handed   Two story home   Drinks caffeine   Social Determinants of Health   Financial Resource Strain: Not on file  Food Insecurity: Not on file  Transportation Needs: Not on file  Physical Activity: Not on file  Stress: Not on file  Social Connections: Not on file  Intimate Partner Violence: Not on file     BP 124/72   Pulse 93   Ht 6' (1.829 m)   Wt 194 lb 9.6 oz (88.3 kg)   SpO2 97%   BMI 26.39 kg/m   Physical Exam:  Well appearing NAD HEENT: Unremarkable Neck:  No JVD, no thyromegally Lymphatics:  No adenopathy Back:  No CVA tenderness Lungs:  Clear with no wheezes HEART:  Regular rate rhythm, no murmurs, no rubs, no clicks Abd:  soft, positive bowel sounds, no organomegally, no rebound, no guarding Ext:  2 plus pulses, no edema, no cyanosis, no clubbing Skin:  No rashes no nodules Neuro:  CN II through XII intact, motor grossly intact  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  See PaceArt for details.   Assess/Plan:  1. Worsening heart block - he has now undergone DDD PM insertion and appears to be doing well. He notes that his heart has regularized. 2. SVT  - he has had no episodes of SVT. He has undergone PPM and uptitration of medical therapy and is doing well.  3. CAD - he has minimal anginal symptoms.  4. Dyslipidemia - he will continue crestor.   Carleene Overlie Aylen Rambert,MD

## 2020-12-21 ENCOUNTER — Telehealth: Payer: Self-pay | Admitting: Radiation Oncology

## 2020-12-21 NOTE — Telephone Encounter (Signed)
Called patient to schedule consultation with Dr. Squire. No answer, LVM for return call. 

## 2020-12-22 LAB — CUP PACEART INCLINIC DEVICE CHECK
Battery Remaining Longevity: 136 mo
Battery Voltage: 3.14 V
Brady Statistic AP VP Percent: 13.39 %
Brady Statistic AP VS Percent: 0.19 %
Brady Statistic AS VP Percent: 79.09 %
Brady Statistic AS VS Percent: 7.33 %
Brady Statistic RA Percent Paced: 14.29 %
Brady Statistic RV Percent Paced: 92.48 %
Date Time Interrogation Session: 20221115164200
Implantable Lead Implant Date: 20220523
Implantable Lead Implant Date: 20220523
Implantable Lead Location: 753859
Implantable Lead Location: 753860
Implantable Lead Model: 3830
Implantable Lead Model: 5076
Implantable Pulse Generator Implant Date: 20220523
Lead Channel Impedance Value: 285 Ohm
Lead Channel Impedance Value: 361 Ohm
Lead Channel Impedance Value: 494 Ohm
Lead Channel Impedance Value: 494 Ohm
Lead Channel Pacing Threshold Amplitude: 0.75 V
Lead Channel Pacing Threshold Amplitude: 0.75 V
Lead Channel Pacing Threshold Pulse Width: 0.4 ms
Lead Channel Pacing Threshold Pulse Width: 0.4 ms
Lead Channel Sensing Intrinsic Amplitude: 19 mV
Lead Channel Sensing Intrinsic Amplitude: 19.5 mV
Lead Channel Sensing Intrinsic Amplitude: 2.375 mV
Lead Channel Sensing Intrinsic Amplitude: 2.75 mV
Lead Channel Setting Pacing Amplitude: 1.5 V
Lead Channel Setting Pacing Amplitude: 2.5 V
Lead Channel Setting Pacing Pulse Width: 0.4 ms
Lead Channel Setting Sensing Sensitivity: 1.2 mV

## 2020-12-25 ENCOUNTER — Other Ambulatory Visit: Payer: Self-pay

## 2020-12-25 DIAGNOSIS — C770 Secondary and unspecified malignant neoplasm of lymph nodes of head, face and neck: Secondary | ICD-10-CM

## 2020-12-26 ENCOUNTER — Ambulatory Visit (INDEPENDENT_AMBULATORY_CARE_PROVIDER_SITE_OTHER): Payer: Medicare (Managed Care)

## 2020-12-26 ENCOUNTER — Telehealth: Payer: Self-pay | Admitting: Hematology

## 2020-12-26 DIAGNOSIS — I459 Conduction disorder, unspecified: Secondary | ICD-10-CM | POA: Diagnosis not present

## 2020-12-26 NOTE — Telephone Encounter (Signed)
Scheduled appt per 11/21 referral. Pt is aware of appt date and time.  

## 2020-12-26 NOTE — Progress Notes (Signed)
Radiation Oncology         [phone] ________________________________  Initial Outpatient Consultation  Name: Steven Mathews MRN: [id number]  Date: 12/27/2020  DOB: December 04, 1945  CC:Cathlean Sauer, MD  Izora Gala, MD   REFERRING PHYSICIAN: Izora Gala, MD  DIAGNOSIS: C01   ICD-10-CM   1. 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 Malignant neoplasm of base of tongue (HCC)  C01       Cancer Staging  Malignant neoplasm of base of tongue (HCC) Staging form: Pharynx - HPV-Mediated Oropharynx, AJCC 8th Edition - Clinical stage from 12/27/2020: Stage II (cT1, cN2, cM0, p16+) - Signed by Eppie Gibson, MD on 12/27/2020 Stage prefix: Initial diagnosis Squamous cell carcinoma of oropharynx, p16(+) and EBV (-)  CHIEF COMPLAINT: Here to discuss management of oropharyngeal cancer  HISTORY OF PRESENT ILLNESS::Steven Mathews is a 75 y.o. male who presented to his PCP, Dr. Lorne Skeens on 09/01/20 with the chief complaint of esophageal dysphagia. The patient was then referred to his gastroenterologist, Dr. Noralee Stain on 09/28/20. During which time, the patient reported progressive dysphagia over the past several months and described a sensation that food bolus stops mid-esophagus before advancing to his stomach. Ultimately, the patient was recommended by Dr. Noralee Stain to undergo endoscopy.  MRI of the neck on 09/26/20 revealed a cystic mass measuring 3.5 x 3.5 x 4.7 cm, located posterior to the right submandibular gland, and favored to be a second branchial cleft cyst. However, some areas of thickening of the wall were appreciated, noted as concerning for the possibility of metastatic carcinoma.  Subsequently, the patient was referred Dr. Dr Constance Holster on 10/02/20. Per Dr. Janeice Robinson initial consult note, the patient reported that he had an infected right maxillary third molar treated and then ultimately extracted about 5 weeks ago. Shortly after it was extracted, he developed the mass to his right  neck. Patient reported the mass to remain the same size since then.  Accordingly, Dr. Constance Holster performed fine needle aspiration of the right neck mass during this visit which revealed squamous cell carcinoma.   The patient underwent upper endoscopy and biopsy on 11/27/20 under the care of Dr. Noralee Stain. Pathology from esophageal biopsy revealed squamous esophagea mucosa with acanthosis, spongiosis, chronic inflammation, and rare eosinophils. Findings were noted as most consisted with changes of reflux esophagitis. No evidence of malignancy was detected.   PET scan on 11/29/20 revealed findings suspicious for neoplasm of the head and neck with right neck adenopathy. Asymmetric uptake at the level of the oropharyngeal tonsillar tissue in base of tongue was appreciated as well, noted to be greatest on the left. Moderate increased metabolic activity was also seen associated with a (presumed) lymph node at the right hilum. Lastly, a fatty area within the posterior left thigh amidst musculature, tracking towards the hamstring insertion was seen. Given boundaries with other adjacent musculature's appreciated, the possibility of an atypical lipomatous lesion could not be entirely excluded.  On 12/11/20, the patient proceeded to undergo multiple biopsies and excisions under the care of Dr. Constance Holster. Pathology from the procedure revealed:  --Right neck lymph node dissection: metastatic squamous cell carcinoma involving 1/14 lymph nodes (P16 positive,  EBV by in-situ hybridization negative).  No comment on extracapsular extension, we are looking into this with pathology --Left tongue base biopsy: non-keratinizing SCC --Right tongue base biopsy: negative for carcinoma  --Left and right tonsil excisions: negative for carcinoma   During follow-up with Dr. Constance Holster on 12/15/20, the patient was noted to  be healing quite well from his recent procedure. Drain was removed during this visit. (Dr. Constance Holster also referred the patient to a  pulmonologist for further evaluation of the hilar mass).   Pertinent imaging thus far includes Chest CT with contrast performed on 12/12/20 revealing again the non-specific enlarged right hilar lymph node, noted as possibly infectious, inflammatory, or neoplastic in etiology. No pulmonary nodules or mass appreciated within the chest otherwise.   Swallowing issues, if any: does report having some difficulty swallowing and sometimes gets food stuck in the area that he points to the upper esophagus. Patient reports that this has been going on for about a year and a half (reported this to Dr. Constance Holster on 10/02/20).  Weight Changes: none  Pain status: none   Other symptoms/concerns: pacemaker  Tobacco history, if any: none  ETOH abuse, if any: reports daily consumption of wine each night  Prior cancers, if any: none  He is here with his wife today who appears very supportive.  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  PREVIOUS RADIATION THERAPY: No  PAST MEDICAL HISTORY:  has a past medical history of Atrial fibrillation ([place]), CAD (coronary artery disease), Chronic cough, GERD (gastroesophageal reflux disease), adenomatous colonic polyps (04/04/2016), Hyperlipidemia, Hypertension, Myocardial infarct ([place]), Presence of permanent cardiac pacemaker, and SVT (supraventricular tachycardia) ([place]).    PAST SURGICAL HISTORY: Past Surgical History:  Procedure Laterality Date   CORONARY ARTERY BYPASS GRAFT  03/2006   DIRECT LARYNGOSCOPY N/A 12/11/2020   Procedure: DIRECT LARYNGOSCOPY WITH BIOPSY;  Surgeon: Izora Gala, MD;  Location: [facility];  Service: ENT;  Laterality: N/A;   ESOPHAGOSCOPY N/A 12/11/2020   Procedure: ESOPHAGOSCOPY;  Surgeon: Izora Gala, MD;  Location: [facility] OR;  Service: ENT;  Laterality: N/A;   [place] CATH AND CORS/GRAFTS ANGIOGRAPHY N/A 03/13/2018   Procedure: LEFT HEART CATH AND CORS/GRAFTS ANGIOGRAPHY;  Surgeon:  Troy Sine, MD;  Location: [facility];  Service: Cardiovascular;  Laterality: N/A;   PACEMAKER IMPLANT N/A 06/26/2020   Procedure: PACEMAKER IMPLANT;  Surgeon: Evans Lance, MD;  Location: [place] [facility];  Service: Cardiovascular;  Laterality: N/A;   RADICAL NECK DISSECTION Right 12/11/2020   Procedure: RIGHT MODIFIED NECK DISSECTION;  Surgeon: Izora Gala, MD;  Location: [place];  Service: ENT;  Laterality: Right;   SUPRAVENTRICULAR TACHYCARDIA ABLATION N/A 09/03/2012   Procedure: SUPRAVENTRICULAR TACHYCARDIA ABLATION;  Surgeon: Evans Lance, MD;  Location: [facility] CATH LAB;  Service: Cardiovascular;  Laterality: N/A;   TONSILLECTOMY Bilateral 12/11/2020   Procedure: BILATERAL TONSILLECTOMY;  Surgeon: Izora Gala, MD;  Location: [facility] OR;  Service: ENT;  Laterality: Bilateral;    FAMILY HISTORY: family history includes Cancer in his mother; Colon cancer (age of onset: 66) in his brother; Liver cancer in his father.  SOCIAL HISTORY:  reports that he has never smoked. He has never used smokeless tobacco. He reports current alcohol use. He reports that he does not use drugs.  ALLERGIES: Patient has no known allergies.  MEDICATIONS:  Current Outpatient Medications  Medication Sig Dispense Refill   aspirin 81 MG chewable tablet Chew 1 tablet (81 mg total) by mouth daily. 90 tablet 3   cholecalciferol (VITAMIN D3) 25 MCG (1000 UNIT) tablet Take 1,000 Units by mouth once a week.     HYDROcodone-acetaminophen (HYCET) 7.5-325 mg/15 ml solution Take 15 mLs by mouth every 6 (six) hours as needed for moderate pain. 420 mL  0   metoprolol succinate (TOPROL-XL) 50 MG 24 hr tablet Take 1 tablet (50 mg total) by mouth daily. Take with or immediately following a meal. 90 tablet 3   omeprazole (PRILOSEC) 40 MG capsule Take 40 mg by mouth daily.     OVER THE COUNTER MEDICATION Take 2 tablets by mouth at bedtime as needed (sleep). Relaxium Sleep     rosuvastatin (CRESTOR) 40 MG tablet Take 1 tablet  (40 mg total) by mouth daily. 90 tablet 3   sildenafil (VIAGRA) 50 MG tablet Take 1 tablet (50 mg total) by mouth daily as needed for erectile dysfunction. 20 tablet 3   No current facility-administered medications for this encounter.    REVIEW OF SYSTEMS:  Notable for that above.   PHYSICAL EXAM:  height is 6' (1.829 m) and weight is 190 lb 12.8 oz (86.5 kg). His temperature is 97.8 F (36.6 C). His blood pressure is 123/75 and his pulse is 73. His respiration is 20 and oxygen saturation is 99%.   General: Alert and oriented, in no acute distress HEENT: Head is normocephalic. Extraocular movements are intact. Oropharynx is notable for no lesions (he is healing well from bx's); no evidence of tumor noted in mouth or upper throat. 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  Satisfactory healing thus far of right neck dissection scar Heart: Regular in rate and rhythm with no murmurs, rubs, or gallops. Chest: Clear to auscultation bilaterally, with no rhonchi, wheezes, or rales. Abdomen: Soft, nontender, nondistended, with no rigidity or guarding. Lymphatics: see Neck Exam Skin: No concerning lesions. Musculoskeletal: symmetric strength and muscle tone throughout. Neurologic: Cranial nerves II through XII are grossly intact. No obvious focalities. Speech is fluent. Coordination is intact. Psychiatric: Judgment and insight are intact. Affect is appropriate.   ECOG = 1  0 - Asymptomatic (Fully active, able to carry on all predisease activities without restriction)  1 - Symptomatic but completely ambulatory (Restricted in physically strenuous activity but ambulatory and able to carry out work of a light or sedentary nature. For example, light housework, office work)  2 - Symptomatic, <50% in bed during the day (Ambulatory and capable of all self care but unable to carry out any work activities. Up and about more than 50% of waking  hours)  3 - Symptomatic, >50% in bed, but not bedbound (Capable of only limited self-care, confined to bed or chair 50% or more of waking hours)  4 - Bedbound (Completely disabled. Cannot carry on any self-care. Totally confined to bed or chair)  5 - Death   Eustace Pen MM, Creech RH, Tormey DC, et al. [phone]). "Toxicity and response criteria of the [facility]". [place] 5 (6): 649-55   LABORATORY DATA:  Lab Results  Component Value Date   WBC 7.0 12/07/2020   HGB 13.4 12/07/2020   HCT 41.2 12/07/2020   MCV 98.8 12/07/2020   PLT 219 12/07/2020   CMP     Component Value Date/Time   NA 140 12/07/2020 1037   NA 140 06/13/2020 0913   K 4.9 12/07/2020 1037   CL 107 12/07/2020 1037   CO2 27 12/07/2020 1037   GLUCOSE 116 (H) 12/07/2020 1037   BUN 25 (H) 12/07/2020 1037   BUN 16 06/13/2020 0913   CREATININE 1.24 12/07/2020 1037   CALCIUM 9.5 12/07/2020 1037   PROT 6.8 04/25/2020 0857   ALBUMIN 4.2 04/25/2020 0857   AST 21 04/25/2020 0857  ALT 20 04/25/2020 0857   ALKPHOS 60 04/25/2020 0857   BILITOT 0.4 04/25/2020 0857   GFRNONAA >60 12/07/2020 1037   GFRAA >60 03/16/2018 0237      Lab Results  Component Value Date   TSH 3.41 04/25/2020     RADIOGRAPHY: CT CHEST W CONTRAST  Result Date: 12/12/2020 CLINICAL DATA:  Hilar mass seen on PET-CT scan. History of oropharyngeal cancer. EXAM: CT CHEST WITH CONTRAST TECHNIQUE: Multidetector CT imaging of the chest was performed during intravenous contrast administration. CONTRAST:  25mL OMNIPAQUE IOHEXOL 350 MG/ML SOLN COMPARISON:  November 29, 2020 FINDINGS: Cardiovascular: The heart is normal in size and there is no pericardial effusion. Coronary artery calcifications are noted and metallic leads are present in the heart. There is atherosclerotic calcification of the aorta without evidence of aneurysm. The pulmonary trunk is normal in caliber Mediastinum/Nodes: Shotty lymph nodes are present in the  mediastinum. There is a prominent lymph node at the right hilum measuring 1.4 cm in short axis diameter. Calcified lymph nodes are noted at the left hilum and mediastinum. The thyroid gland, trachea, and esophagus are within normal limits. There is a small hiatal hernia. Lungs/Pleura: Mild atelectasis or scarring is noted at the lung bases bilaterally and subsegmental atelectasis is present in the left lower lobe. There is mild apical pleural scarring bilaterally. No pulmonary nodule or mass is seen. No effusion or pneumothorax. Calcified granuloma is present in the left upper lobe Upper Abdomen: No acute abnormality. A small hiatal hernia is noted. Musculoskeletal: Surgical stables and a few foci of air are noted in the soft tissues in the cervical region to the right of midline. Sternotomy wires are present over the midline. Degenerative changes are present in the thoracic spine. No suspicious osseous abnormality is identified. IMPRESSION: 1. Nonspecific enlarged right hilar lymph node which corresponds to lymph node seen on recent PET CT. Findings may be infectious, inflammatory, or neoplastic. 2. No pulmonary nodule or mass. 3. Surgical changes and small foci of free air in the cervical soft tissues to the right of midline. 4. Small hiatal hernia. 5. Coronary artery calcifications. Electronically Signed   By: Brett Fairy M.D.   On: 12/12/2020 21:11   NM PET Image Initial (PI) Whole Body (F-18 FDG)  Result Date: 12/01/2020 CLINICAL DATA:  Initial treatment strategy for staging of suspected head neck cancer to cervical lymph nodes. EXAM: NUCLEAR MEDICINE PET WHOLE BODY TECHNIQUE: 9.9 mCi F-18 FDG was injected intravenously. Full-ring PET imaging was performed from the head to foot after the radiotracer. CT data was obtained and used for attenuation correction and anatomic localization. Fasting blood glucose: 107 mg/dl COMPARISON:  Report from previous MRI of the head and neck. No prior PET exams or CT  imaging of the neck or chest with prior abdominal imaging from 2017. FINDINGS: Mediastinal blood pool activity: SUV max 2.73 HEAD/NECK: Asymmetric tonsillar and base of tongue activity favoring the LEFT but seen diffusely across lymphoid tissue in the oropharynx, greatest SUV of 7 point 8 (image 61/4) Large centrally necrotic lymph node in the RIGHT neck at level II/III shows a maximum SUV of 7.42 measuring 3.0 x 2.7 cm in the axial plane (image 64/4) Incidental CT findings: none CHEST: RIGHT hilar lymph node with a maximum SUV of 4.3 discrete lymph node not visible. Suspect nodal tissue based on location no suspicious pulmonary nodules with increased metabolic activity. Incidental CT findings: Signs of prior granulomatous disease. No effusion or consolidation. Airways are patent. Signs of median  sternotomy for CABG. Cardiac pacer device, power pack over the LEFT chest. Leads over the RIGHT heart. No pericardial effusion. Normal caliber of the aorta and central pulmonary vasculature. Limited assessment of cardiovascular structures given lack of intravenous contrast. ABDOMEN/PELVIS: No abnormal hypermetabolic activity within the liver, pancreas, adrenal glands, or spleen. No hypermetabolic lymph nodes in the abdomen or pelvis. Incidental CT findings: Small hiatal hernia. No acute findings in the abdomen or in the pelvis. Aortic atherosclerosis without aneurysm. SKELETON: Ovoid area in the musculature of the posterior LEFT thigh tracking towards the hamstring insertion. Fat admixed with soft tissue and musculature, some mildly increased metabolic activity in this location with a maximum SUV of 3.8 no increased metabolic activity in visualized skeletal structures select visualized skeletal structures skeletal structures. Incidental CT findings: none EXTREMITIES: No abnormal hypermetabolic activity in the lower extremities. Incidental CT findings: none IMPRESSION: Findings suspicious for neoplasm of the head and neck  with RIGHT neck adenopathy. Asymmetric uptake at the level of the oropharyngeal tonsillar tissue in base of tongue greatest on the LEFT, correlation with direct visualization or cross-sectional imaging with intravenous contrast may be helpful. Moderate increased metabolic activity associated with what is likely a lymph node at the RIGHT hilum. Nonspecific in a patient with evidence of prior granulomatous disease but suspicious given other findings. Fatty area within posterior LEFT thigh amidst musculature tracking toward hamstring insertion. Perhaps this represents sequela of prior trauma with asymmetric atrophy. Given boundaries with other, adjacent musculature atypical lipomatous lesion is not entirely excluded, consider correlation with MRI if there is no prior imaging for comparison. Aortic Atherosclerosis (ICD10-I70.0). Electronically Signed   By: Zetta Bills M.D.   On: 12/01/2020 11:18   CUP PACEART INCLINIC DEVICE CHECK  Result Date: 12/22/2020 Pacemaker check in clinic. Normal device function. Thresholds, sensing, impedances consistent with previous measurements. Device programmed to maximize longevity. No mode switch or high ventricular rates noted. Device programmed at appropriate safety margins. Histogram distribution appropriate for patient activity level. Device programmed to optimize intrinsic conduction. Estimated longevity 11.3 years. [facility] 12/26/21. ROV with GT 12 monthsElizabeth Watts BSN,RN,CCDS     IMPRESSION/PLAN:  This is a delightful patient with head and neck cancer.  Initially his primary was unknown.  When he underwent endoscopy and biopsies, they yielded squamous cell carcinoma of the left base of tongue, contralateral from his right neck adenopathy.  He is status post right neck dissection.  The primary in his throat has not been treated, nor has the left neck.  PET scan does not show any other site of disease other than a nonspecific area of hypermetabolic activity in the  hilum which will be evaluated in the near future by pulmonology.  He is going to be seen by the otolaryngology team at [facility] in [place].  He also has a pending appointment with medical oncology.  The patient has significant cardiac history, and he is not enthusiastic about undergoing chemotherapy.  We do not yet have definitive comment from pathology as to whether or not the right neck node had extracapsular extension.  Since the clinically positive lymph node with has been removed, chemotherapy may have minimal benefits, if any, unless there is extracapsular extension noted from the specimen.  His comorbidities will play a factor in medical oncology's recommendations as well, I expect.  I am not sure that adding more surgery to his treatment protocol will reduce morbidity or significantly improve his chance of cure.  I do not think that surgery will eliminate the  need for chemotherapy (this hinges upon ECE status more than anything).  I am happy to speak with otolaryngology if they ultimately recommend more surgery.  Otherwise I recommend that he receive treatment to the entire base of tongue and the bilateral neck over 6-7 weeks.   With most of his gross disease removed, and a relatively small primary remaining in his BOT, I think he has a good chance of cure with radiation alone.   He understands that if he undergoes more surgery, I would still recommend postoperative radiation given that he had a sizable neck node which was contralateral to his primary.  We discussed the potential risks, benefits, and side effects of radiotherapy. We talked in detail about acute and late effects. We discussed that some of the most bothersome acute effects may be mucositis, dysgeusia, salivary changes, skin irritation, hair loss, dehydration, weight loss and fatigue. We talked about late effects which include but are not necessarily limited to dysphagia, hypothyroidism, nerve injury, vascular injury, spinal  cord injury, xerostomia, trismus, neck edema, and potential injury to any of the tissues in the head and neck region. No guarantees of treatment were given. A consent form was signed and placed in the patient's medical record. The patient is enthusiastic about proceeding with treatment. I look forward to participating in the patient's care.    Simulation (treatment planning) will take place upon clearance by dentistry and otolaryngology  We also discussed that the treatment of head and neck cancer is a multidisciplinary process to maximize treatment outcomes and quality of life. For this reason the following referrals have been or will be made:   Medical oncology to discuss chemotherapy    Dentistry for dental evaluation, possible extractions in the radiation fields, and /or advice on reducing risk of cavities, osteoradionecrosis, or other oral issues.  The patient also has concerns that the right posterior maxillary wisdom tooth that was removed right before his diagnosis has something to do with his cancer (he is concerned about a separate oral cancer primary).  Statistically speaking this is very unlikely, but he finds this to be a huge coincidence (the neck node blossomed soon after his extraction).  He will talk about this more with otolaryngology and with Dr. Benson Norway for reassurance.  On exam today I did not see any mucosal abnormality in the right maxillary alveolus.   Nutritionist for nutrition support during and after treatment.   Speech language pathology for swallowing and/or speech therapy.   Social work for social support.    Physical therapy due to risk of lymphedema in neck and deconditioning.    On date of service, in total, I spent 60 minutes on this encounter. Patient was seen in person.  __________________________________________   Eppie Gibson, MD  This document serves as a record of services personally performed by Eppie Gibson, MD. It was created on her behalf by Roney Mans, a trained medical scribe. The creation of this record is based on the scribe's personal observations and the provider's statements to them. This document has been checked and approved by the attending provider.

## 2020-12-26 NOTE — Progress Notes (Signed)
Oncology Nurse Navigator Documentation   Placed introductory call to new referral patient Lytle Mal[place] Introduced myself as the H&N oncology nurse navigator that works with Dr. Isidore Moos to whom he has been referred by Dr. Constance Holster.  He confirmed understanding of referral. Briefly explained my role as his navigator, provided my contact information.  Confirmed understanding of upcoming appts and [facility] location, explained arrival and registration process. I explained the purpose of a dental evaluation prior to starting RT, indicated he would be contacted by [facility] DM to arrange an appt.   I encouraged him to call with questions/concerns as he moves forward with appts and procedures.   He verbalized understanding of information provided, expressed appreciation for my call.   Navigator Initial Assessment Employment Status: he is retired Currently on [employer] / STD: na Living Situation: he lives with his wife Support System: wife, family PCP: Cathlean Sauer MD PCD: he sees a Pharmacist, community regularly  Financial Concerns:no Transportation Needs: no Sensory Deficits:no Engineer, building services Needed:  no Ambulation Needs: no DME Used in Home: no Psychosocial Needs:  no Concerns/Needs Understanding Cancer:  addressed/answered by navigator to best of ability Self-Expressed Needs: no   Clinical biochemist, BSN, OCN Head & Neck Oncology Nurse [place] Phone # [phone]  Fax # [phone]

## 2020-12-26 NOTE — Progress Notes (Signed)
Head and Neck Cancer Location of Tumor / Histology:  Squamous cell carcinoma of oropharynx, p16(+) and EBV (-)  Patient presented with symptoms of: (from Dr. Janeice Robinson H&P 12/04/20) About 5weeks ago he had an infected right maxillary third molar treated and then ultimately extracted. Shortly thereafter he developed a mass in the right neck. He denies any sore throat but he does have some difficulty swallowing and sometimes food gets stuck in the area that he points to the upper esophagus. He denies ear pain except when his hearing aids are bothering him.  Biopsies revealed:  12/11/2020 FINAL MICROSCOPIC DIAGNOSIS:  A. TONSIL, RIGHT, EXCISION:  -  Benign tonsil  -  No carcinoma identified  B. TONGUE, RIGHT BASE, BIOPSY:  -  Benign lingual tonsil  -  No carcinoma identified  C. TONSIL, LEFT, EXCISION:  -  Benign tonsil  -  No carcinoma identified  D. TONGUE, RIGHT BASE, BIOPSY:  -  No carcinoma identified  E. TONGUE, LEFT BASE, BIOPSY:  -  Squamous cell carcinoma, non-keratinizing  F. 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  A small focus of invasive squamous cell carcinoma is present on the first level of the biopsy.  P16 immunohistochemistry was performed; however, the focus of squamous cell carcinoma has been cut away.   F.  P16 immunohistochemistry is positive. EBV by in-situ hybridization is negative.   Nutrition Status Yes No Comments  Weight changes? [x]  []  (~5-10lbs) Related to difficulty eating after surgery  Swallowing concerns? [x]  []  Continues to experience throat soreness and tenderness to the right side of his neck; Reports he's eating more solid foods, but it is uncomfortable   PEG? []  [x]     Referrals Yes No Comments  Social Work? [x]  []    Dentistry? [x]  []  Appointment with Dr. Sandi Mariscal 01/02/2021  Swallowing therapy? [x]  []    Nutrition? [x]  []    Med/Onc? [x]  []  Dr.  Sullivan Lone   Safety Issues Yes No Comments  Prior radiation? []  [x]    Pacemaker/ICD? [x]  []  Placed on 06/26/2020 by Dr. Cristopher Peru  Possible current pregnancy? []  [x]  N/A  Is the patient on methotrexate? []  [x]     Tobacco/Marijuana/Snuff/ETOH use: Patient has never smoked or used smokeless tobacco. Reports occasional wine consumption. Denies any recreational drug use  Past/Anticipated interventions by otolaryngology, if any:  12/21/2020 (office visit) --Dr. Izora Gala 10 days postop, doing reasonably well. Neck looks excellent. Staples removed.  We discussed the pathologic findings again. I am going to have pathology review of the neck specimen to see if there is evidence of extranodal extension.  If there is not then he may be a candidate for T ORS over at [facility].  12/11/2020 --Dr. Izora Gala RIGHT MODIFIED NECK DISSECTION DIRECT LARYNGOSCOPY WITH BIOPSY ESOPHAGOSCOPY BILATERAL TONSILLECTOMY  Past/Anticipated interventions by medical oncology, if any:  Scheduled for consultation with Dr. Sullivan Lone on 01/11/2021  Current Complaints / other details:  Reports constant right, inner ear pain.

## 2020-12-27 ENCOUNTER — Ambulatory Visit
Admission: RE | Admit: 2020-12-27 | Discharge: 2020-12-27 | Disposition: A | Discharge status: Home / Self Care | Payer: Medicare (Managed Care) | Source: Ambulatory Visit | Attending: Radiation Oncology | Admitting: Radiation Oncology

## 2020-12-27 ENCOUNTER — Other Ambulatory Visit: Payer: Self-pay

## 2020-12-27 ENCOUNTER — Encounter: Payer: Self-pay | Admitting: Radiation Oncology

## 2020-12-27 VITALS — BP 123/75 | HR 73 | Temp 97.8°F | Resp 20 | Ht 72.0 in | Wt 190.8 lb

## 2020-12-27 DIAGNOSIS — Z79899 Other long term (current) drug therapy: Secondary | ICD-10-CM | POA: Insufficient documentation

## 2020-12-27 DIAGNOSIS — E785 Hyperlipidemia, unspecified: Secondary | ICD-10-CM | POA: Diagnosis not present

## 2020-12-27 DIAGNOSIS — Z86010 Personal history of colonic polyps: Secondary | ICD-10-CM | POA: Insufficient documentation

## 2020-12-27 DIAGNOSIS — R1310 Dysphagia, unspecified: Secondary | ICD-10-CM | POA: Diagnosis not present

## 2020-12-27 DIAGNOSIS — I252 Old myocardial infarction: Secondary | ICD-10-CM | POA: Diagnosis not present

## 2020-12-27 DIAGNOSIS — C770 Secondary and unspecified malignant neoplasm of lymph nodes of head, face and neck: Secondary | ICD-10-CM

## 2020-12-27 DIAGNOSIS — C01 Malignant neoplasm of base of tongue: Secondary | ICD-10-CM | POA: Diagnosis not present

## 2020-12-27 DIAGNOSIS — K449 Diaphragmatic hernia without obstruction or gangrene: Secondary | ICD-10-CM | POA: Insufficient documentation

## 2020-12-27 DIAGNOSIS — I10 Essential (primary) hypertension: Secondary | ICD-10-CM | POA: Insufficient documentation

## 2020-12-27 DIAGNOSIS — I4891 Unspecified atrial fibrillation: Secondary | ICD-10-CM | POA: Diagnosis not present

## 2020-12-27 DIAGNOSIS — K219 Gastro-esophageal reflux disease without esophagitis: Secondary | ICD-10-CM | POA: Insufficient documentation

## 2020-12-27 DIAGNOSIS — I2510 Atherosclerotic heart disease of native coronary artery without angina pectoris: Secondary | ICD-10-CM | POA: Diagnosis not present

## 2020-12-27 DIAGNOSIS — K2100 Gastro-esophageal reflux disease with esophagitis, without bleeding: Secondary | ICD-10-CM | POA: Diagnosis not present

## 2020-12-27 NOTE — Progress Notes (Signed)
Oncology Nurse Navigator Documentation   Met with patient during initial consult with Dr. Isidore Moos.  He was accompanied by his wife Wells Guiles.  Further introduced myself as his/their Navigator, explained my role as a member of the Care Team. Provided New Patient Information packet: Contact information for physician, this navigator, other members of the Care Team Advance Directive information ([place] blue pamphlet with LCSW insert); provided [facility] AD booklet at his request,  Fall Prevention Patient [place] Information sheet Symptom Management Clinic information [facility] campus map with highlight of [place] SLP Information sheet Provided and discussed educational handouts for PEG and PAC. Assisted with post-consult appt scheduling. I toured him to the CT simulation and LINAC treatment area, explained procedures for lobby registration, arrival to Radiation Waiting, and arrival to treatment area.  He voiced understanding.   I explained the location of Dr. Raynelle Dick office as reference for future appts, including arrival procedure for these appts.   They verbalized understanding of information provided. I encouraged them to call with questions/concerns moving forward.  Harlow Asa, RN, BSN, OCN Head & Neck Oncology Nurse [place] at [place] [phone]

## 2020-12-29 NOTE — Progress Notes (Signed)
Oncology Nurse Navigator Documentation   At Dr. Pearlie Oyster request, I called [facility] and spoke to [facility] to request a Pathologist comment on if extracapsular extension is present on [facility] 12/11/20 biopsy. Jodi Mourning will speak a Industrial/product designer today and have it completed.   Harlow Asa RN, BSN, OCN Head & Neck Oncology Nurse [place] at [facility] Phone # [phone]  Fax # [phone]

## 2020-12-30 LAB — CUP PACEART REMOTE DEVICE CHECK
Battery Remaining Longevity: 136 mo
Battery Voltage: 3.14 V
Brady Statistic AP VP Percent: 3.53 %
Brady Statistic AP VS Percent: 0.05 %
Brady Statistic AS VP Percent: 92.73 %
Brady Statistic AS VS Percent: 3.69 %
Brady Statistic RA Percent Paced: 3.73 %
Brady Statistic RV Percent Paced: 96.26 %
Date Time Interrogation Session: 20221123141659
Implantable Lead Implant Date: 20220523
Implantable Lead Implant Date: 20220523
Implantable Lead Location: 753859
Implantable Lead Location: 753860
Implantable Lead Model: 3830
Implantable Lead Model: 5076
Implantable Pulse Generator Implant Date: 20220523
Lead Channel Impedance Value: 266 Ohm
Lead Channel Impedance Value: 361 Ohm
Lead Channel Impedance Value: 475 Ohm
Lead Channel Impedance Value: 513 Ohm
Lead Channel Pacing Threshold Amplitude: 0.75 V
Lead Channel Pacing Threshold Amplitude: 0.875 V
Lead Channel Pacing Threshold Pulse Width: 0.4 ms
Lead Channel Pacing Threshold Pulse Width: 0.4 ms
Lead Channel Sensing Intrinsic Amplitude: 19.25 mV
Lead Channel Sensing Intrinsic Amplitude: 19.25 mV
Lead Channel Sensing Intrinsic Amplitude: 2.125 mV
Lead Channel Sensing Intrinsic Amplitude: 2.125 mV
Lead Channel Setting Pacing Amplitude: 1.75 V
Lead Channel Setting Pacing Amplitude: 2.5 V
Lead Channel Setting Pacing Pulse Width: 0.4 ms
Lead Channel Setting Sensing Sensitivity: 1.2 mV

## 2020-12-30 LAB — SURGICAL PATHOLOGY

## 2020-12-31 NOTE — H&P (View-Only) (Signed)
Synopsis: Referred for right hilar adenopathy by Cathlean Sauer, MD  Subjective:   PATIENT ID: Steven Mathews GENDER: male DOB: 1945-11-01, MRN: [id number]  Chief Complaint  Patient presents with   Consult    Patient reports that he is doing well but has concerns.    75yM with history of recently discovered head/neck SCC, CAD, AF, s/p PPM, SVT.  SCC discovered during workup of OP dysphagia, underwent right neck dissection and biopsy of left/right tongue base, excisional biopsy of tonsils. Preceding PET/CT showed hypermetabolic right hilar lymph node for which he was referred to [place].   He says he's overall doing well. Still has sore throat after surgery. No hemoptysis. Still has some trouble swallowing which he attributes to sore throat.   Otherwise pertinent review of systems is negative.  Past Medical History:  Diagnosis Date   Atrial fibrillation ([place])    CAD (coronary artery disease)    CABG 2008; 03/13/2018 NSTEMI due to acute thrombotic occlusion of RCA graft, patent LIMA to LAD.   Chronic cough    GERD (gastroesophageal reflux disease)    Hx of adenomatous colonic polyps 04/04/2016   Hyperlipidemia    Hypertension    Myocardial infarct (H[facility])    Presence of permanent cardiac pacemaker    SVT (supraventricular tachycardia) ([place])      Family History  Problem Relation Age of Onset   Liver cancer Father    Cancer Mother        bladder cancer   Colon cancer Brother 85     Past Surgical History:  Procedure Laterality Date   CORONARY ARTERY BYPASS GRAFT  03/2006   DIRECT LARYNGOSCOPY N/A 12/11/2020   Procedure: DIRECT LARYNGOSCOPY WITH BIOPSY;  Surgeon: Izora Gala, MD;  Location: [place];  Service: ENT;  Laterality: N/A;   ESOPHAGOSCOPY N/A 12/11/2020   Procedure: ESOPHAGOSCOPY;  Surgeon: Izora Gala, MD;  Location: [place];  Service: ENT;  Laterality: N/A;   [place] CATH AND CORS/GRAFTS ANGIOGRAPHY N/A 03/13/2018   Procedure: LEFT HEART CATH AND  CORS/GRAFTS ANGIOGRAPHY;  Surgeon: Troy Sine, MD;  Location: [place] CV LAB;  Service: Cardiovascular;  Laterality: N/A;   PACEMAKER IMPLANT N/A 06/26/2020   Procedure: PACEMAKER IMPLANT;  Surgeon: Evans Lance, MD;  Location: [place] CV LAB;  Service: Cardiovascular;  Laterality: N/A;   RADICAL NECK DISSECTION Right 12/11/2020   Procedure: RIGHT MODIFIED NECK DISSECTION;  Surgeon: Izora Gala, MD;  Location: [place];  Service: ENT;  Laterality: Right;   SUPRAVENTRICULAR TACHYCARDIA ABLATION N/A 09/03/2012   Procedure: SUPRAVENTRICULAR TACHYCARDIA ABLATION;  Surgeon: Evans Lance, MD;  Location[facility] C[facility] LAB;  Service: Cardiovascular;  Laterality: N/A;   TONSILLECTOMY Bilateral 12/11/2020   Procedure: BILATERAL TONSILLECTOMY;  Surgeon: Izora Gala, MD;  Location: [facility];  Service: ENT;  Laterality: Bilateral;    Social History   Socioeconomic History   Marital status: Married    Spouse name: Not on file   Number of children: Not on file   Years of education: Not on file   Highest education level: Not on file  Occupational History   Not on file  Tobacco Use   Smoking status: Never   Smokeless tobacco: Never  Vaping Use   Vaping Use: Never used  Substance and Sexual Activity   Alcohol use: Yes    Comment: 2-3 glasses wine/day   Drug use: No   Sexual activity: Not on file  Other Topics Concern  Not on file  Social History Narrative   Right handed   Two story home   Drinks caffeine   Social Determinants of Health   Financial Resource Strain: Not on file  Food Insecurity: Not on file  Transportation Needs: Not on file  Physical Activity: Not on file  Stress: Not on file  Social Connections: Not on file  Intimate Partner Violence: Not on file     No Known Allergies   Outpatient Medications Prior to Visit  Medication Sig Dispense Refill   aspirin 81 MG chewable tablet Chew 1 tablet (81 mg total) by mouth daily. 90 tablet 3   cholecalciferol (VITAMIN D3)  25 MCG (1000 UNIT) tablet Take 1,000 Units by mouth once a week.     HYDROcodone-acetaminophen (HYCET) 7.5-325 mg/15 ml solution Take 15 mLs by mouth every 6 (six) hours as needed for moderate pain. 420 mL 0   metoprolol succinate (TOPROL-XL) 50 MG 24 hr tablet Take 1 tablet (50 mg total) by mouth daily. Take with or immediately following a meal. 90 tablet 3   omeprazole (PRILOSEC) 40 MG capsule Take 40 mg by mouth daily.     OVER THE COUNTER MEDICATION Take 2 tablets by mouth at bedtime as needed (sleep). Relaxium Sleep     sildenafil (VIAGRA) 50 MG tablet Take 1 tablet (50 mg total) by mouth daily as needed for erectile dysfunction. 20 tablet 3   rosuvastatin (CRESTOR) 40 MG tablet Take 1 tablet (40 mg total) by mouth daily. 90 tablet 3   No facility-administered medications prior to visit.       Objective:   Physical Exam:  General appearance: 75 y.o., male, NAD, conversant  Eyes: anicteric sclerae, moist conjunctivae; no lid-lag; PERRL, tracking appropriately HENT: NCAT; oropharynx, MMM, no mucosal ulcerations; normal hard and soft palate Neck: Trachea midline; no lymphadenopathy, no JVD. Incision over R neck apparetnly healing well Lungs: CTAB, no crackles, no wheeze, with normal respiratory effort CV: RRR, no MRGs  Abdomen: Soft, non-tender; non-distended, BS present  Extremities: No peripheral edema, radial and DP pulses present bilaterally  Skin: Normal temperature, turgor and texture; no rash Psych: Appropriate affect Neuro: Alert and oriented to person and place, no focal deficit    Vitals:   01/01/21 0934  BP: 120/74  Pulse: 78  Temp: 97.9 F (36.6 C)  TempSrc: Oral  SpO2: 93%  Weight: 193 lb (87.5 kg)  Height: 6' (1.829 m)   93% on RA BMI Readings from Last 3 Encounters:  01/01/21 26.18 kg/m  12/27/20 25.88 kg/m  12/19/20 26.39 kg/m   Wt Readings from Last 3 Encounters:  01/01/21 193 lb (87.5 kg)  12/27/20 190 lb 12.8 oz (86.5 kg)  12/19/20 194 lb 9.6  oz (88.3 kg)     CBC    Component Value Date/Time   WBC 7.0 12/07/2020 1037   RBC 4.17 (L) 12/07/2020 1037   HGB 13.4 12/07/2020 1037   HGB 15.1 06/13/2020 0913   HCT 41.2 12/07/2020 1037   HCT 43.0 06/13/2020 0913   PLT 219 12/07/2020 1037   PLT 264 06/13/2020 0913   MCV 98.8 12/07/2020 1037   MCV 94 06/13/2020 0913   MCH 32.1 12/07/2020 1037   MCHC 32.5 12/07/2020 1037   RDW 12.5 12/07/2020 1037   RDW 12.6 06/13/2020 0913   LYMPHSABS 2.3 06/13/2020 0913   MONOABS 0.4 03/13/2018 2055   EOSABS 0.3 06/13/2020 0913   BASOSABS 0.1 06/13/2020 0913     Chest Imaging: PET/CT 11/29/20 reviewed  by me remarkable for hypermetabolic right hilar node (looks like 10R has greatest activity on PET but on subsequent CT Chest 10R looks small, but does have mildly enlarged 11R node). Also hypermetabolic activity right neck, left tongue base    CT Chest 12/12/20 reviewed by me remarkable for 1.4cm R hilar node      Pulmonary Functions Testing Results: No flowsheet data found.    Echocardiogram:   Echo 2014 EF 55-60%     Assessment & Plan:   # hypermetabolic right hilar lymph node # head and neck cancer  Plan: - diagnostic ebus under general  I spent 62 minutes dedicated to the care of this patient on the date of this encounter to include pre-visit review of records, face-to-face time with the patient discussing conditions above, post visit ordering of testing, clinical documentation with the electronic health record, making appropriate referrals as documented, and communicating necessary findings to members of the patients care team.       [name], MD [facility] 01/01/2021 12:56 PM

## 2020-12-31 NOTE — Progress Notes (Signed)
Synopsis: Referred for right hilar adenopathy by Cathlean Sauer, MD  Subjective:   PATIENT ID: Steven Mathews GENDER: male DOB: March 24, 1945, MRN: [id number]  Chief Complaint  Patient presents with   Consult    Patient reports that he is doing well but has concerns.    43yM with history of recently discovered head/neck SCC, CAD, AF, s/p PPM, SVT.  SCC discovered during workup of OP dysphagia, underwent right neck dissection and biopsy of left/right tongue base, excisional biopsy of tonsils. Preceding PET/CT showed hypermetabolic right hilar lymph node for which he was referred to [place].   He says he's overall doing well. Still has sore throat after surgery. No hemoptysis. Still has some trouble swallowing which he attributes to sore throat.   Otherwise pertinent review of systems is negative.  Past Medical History:  Diagnosis Date   Atrial fibrillation ([place])    CAD (coronary artery disease)    CABG 2008; 03/13/2018 NSTEMI due to acute thrombotic occlusion of RCA graft, patent LIMA to LAD.   Chronic cough    GERD (gastroesophageal reflux disease)    Hx of adenomatous colonic polyps 04/04/2016   Hyperlipidemia    Hypertension    Myocardial infarct ([facility])    Presence of permanent cardiac pacemaker    SVT (supraventricular tachycardia) ([place])      Family History  Problem Relation Age of Onset   Liver cancer Father    Cancer Mother        bladder cancer   Colon cancer Brother 54     Past Surgical History:  Procedure Laterality Date   CORONARY ARTERY BYPASS GRAFT  03/2006   DIRECT LARYNGOSCOPY N/A 12/11/2020   Procedure: DIRECT LARYNGOSCOPY WITH BIOPSY;  Surgeon: Izora Gala, MD;  Location: [place];  Service: ENT;  Laterality: N/A;   ESOPHAGOSCOPY N/A 12/11/2020   Procedure: ESOPHAGOSCOPY;  Surgeon: Izora Gala, MD;  Location: [place];  Service: ENT;  Laterality: N/A;   [place] CATH AND CORS/GRAFTS ANGIOGRAPHY N/A 03/13/2018   Procedure: LEFT HEART CATH AND  CORS/GRAFTS ANGIOGRAPHY;  Surgeon: Troy Sine, MD;  Location: [place] CV LAB;  Service: Cardiovascular;  Laterality: N/A;   PACEMAKER IMPLANT N/A 06/26/2020   Procedure: PACEMAKER IMPLANT;  Surgeon: Evans Lance, MD;  Location: [place] CV LAB;  Service: Cardiovascular;  Laterality: N/A;   RADICAL NECK DISSECTION Right 12/11/2020   Procedure: RIGHT MODIFIED NECK DISSECTION;  Surgeon: Izora Gala, MD;  Location: [place];  Service: ENT;  Laterality: Right;   SUPRAVENTRICULAR TACHYCARDIA ABLATION N/A 09/03/2012   Procedure: SUPRAVENTRICULAR TACHYCARDIA ABLATION;  Surgeon: Evans Lance, MD;  Location: [facility] CATH LAB;  Service: Cardiovascular;  Laterality: N/A;   TONSILLECTOMY Bilateral 12/11/2020   Procedure: BILATERAL TONSILLECTOMY;  Surgeon: Izora Gala, MD;  Location: [facility] OR;  Service: ENT;  Laterality: Bilateral;    Social History   Socioeconomic History   Marital status: Married    Spouse name: Not on file   Number of children: Not on file   Years of education: Not on file   Highest education level: Not on file  Occupational History   Not on file  Tobacco Use   Smoking status: Never   Smokeless tobacco: Never  Vaping Use   Vaping Use: Never used  Substance and Sexual Activity   Alcohol use: Yes    Comment: 2-3 glasses wine/day   Drug use: No   Sexual activity: Not on file  Other Topics Concern  Not on file  Social History Narrative   Right handed   Two story home   Drinks caffeine   Social Determinants of Health   Financial Resource Strain: Not on file  Food Insecurity: Not on file  Transportation Needs: Not on file  Physical Activity: Not on file  Stress: Not on file  Social Connections: Not on file  Intimate Partner Violence: Not on file     No Known Allergies   Outpatient Medications Prior to Visit  Medication Sig Dispense Refill   aspirin 81 MG chewable tablet Chew 1 tablet (81 mg total) by mouth daily. 90 tablet 3   cholecalciferol (VITAMIN D3)  25 MCG (1000 UNIT) tablet Take 1,000 Units by mouth once a week.     HYDROcodone-acetaminophen (HYCET) 7.5-325 mg/15 ml solution Take 15 mLs by mouth every 6 (six) hours as needed for moderate pain. 420 mL 0   metoprolol succinate (TOPROL-XL) 50 MG 24 hr tablet Take 1 tablet (50 mg total) by mouth daily. Take with or immediately following a meal. 90 tablet 3   omeprazole (PRILOSEC) 40 MG capsule Take 40 mg by mouth daily.     OVER THE COUNTER MEDICATION Take 2 tablets by mouth at bedtime as needed (sleep). Relaxium Sleep     sildenafil (VIAGRA) 50 MG tablet Take 1 tablet (50 mg total) by mouth daily as needed for erectile dysfunction. 20 tablet 3   rosuvastatin (CRESTOR) 40 MG tablet Take 1 tablet (40 mg total) by mouth daily. 90 tablet 3   No facility-administered medications prior to visit.       Objective:   Physical Exam:  General appearance: 75 y.o., male, NAD, conversant  Eyes: anicteric sclerae, moist conjunctivae; no lid-lag; PERRL, tracking appropriately HENT: NCAT; oropharynx, MMM, no mucosal ulcerations; normal hard and soft palate Neck: Trachea midline; no lymphadenopathy, no JVD. Incision over R neck apparetnly healing well Lungs: CTAB, no crackles, no wheeze, with normal respiratory effort CV: RRR, no MRGs  Abdomen: Soft, non-tender; non-distended, BS present  Extremities: No peripheral edema, radial and DP pulses present bilaterally  Skin: Normal temperature, turgor and texture; no rash Psych: Appropriate affect Neuro: Alert and oriented to person and place, no focal deficit    Vitals:   01/01/21 0934  BP: 120/74  Pulse: 78  Temp: 97.9 F (36.6 C)  TempSrc: Oral  SpO2: 93%  Weight: 193 lb (87.5 kg)  Height: 6' (1.829 m)   93% on RA BMI Readings from Last 3 Encounters:  01/01/21 26.18 kg/m  12/27/20 25.88 kg/m  12/19/20 26.39 kg/m   Wt Readings from Last 3 Encounters:  01/01/21 193 lb (87.5 kg)  12/27/20 190 lb 12.8 oz (86.5 kg)  12/19/20 194 lb 9.6  oz (88.3 kg)     CBC    Component Value Date/Time   WBC 7.0 12/07/2020 1037   RBC 4.17 (L) 12/07/2020 1037   HGB 13.4 12/07/2020 1037   HGB 15.1 06/13/2020 0913   HCT 41.2 12/07/2020 1037   HCT 43.0 06/13/2020 0913   PLT 219 12/07/2020 1037   PLT 264 06/13/2020 0913   MCV 98.8 12/07/2020 1037   MCV 94 06/13/2020 0913   MCH 32.1 12/07/2020 1037   MCHC 32.5 12/07/2020 1037   RDW 12.5 12/07/2020 1037   RDW 12.6 06/13/2020 0913   LYMPHSABS 2.3 06/13/2020 0913   MONOABS 0.4 03/13/2018 2055   EOSABS 0.3 06/13/2020 0913   BASOSABS 0.1 06/13/2020 0913     Chest Imaging: PET/CT 11/29/20 reviewed  by me remarkable for hypermetabolic right hilar node (looks like 10R has greatest activity on PET but on subsequent CT Chest 10R looks small, but does have mildly enlarged 11R node). Also hypermetabolic activity right neck, left tongue base    CT Chest 12/12/20 reviewed by me remarkable for 1.4cm R hilar node      Pulmonary Functions Testing Results: No flowsheet data found.    Echocardiogram:   Echo 2014 EF 55-60%     Assessment & Plan:   # hypermetabolic right hilar lymph node # head and neck cancer  Plan: - diagnostic ebus under general  I spent 62 minutes dedicated to the care of this patient on the date of this encounter to include pre-visit review of records, face-to-face time with the patient discussing conditions above, post visit ordering of testing, clinical documentation with the electronic health record, making appropriate referrals as documented, and communicating necessary findings to members of the patients care team.       [name], MD [place] [facility] 01/01/2021 12:56 PM

## 2021-01-01 ENCOUNTER — Encounter: Payer: Self-pay | Admitting: Student

## 2021-01-01 ENCOUNTER — Ambulatory Visit (INDEPENDENT_AMBULATORY_CARE_PROVIDER_SITE_OTHER): Payer: Medicare (Managed Care) | Admitting: Student

## 2021-01-01 ENCOUNTER — Other Ambulatory Visit: Payer: Self-pay

## 2021-01-01 VITALS — BP 120/74 | HR 78 | Temp 97.9°F | Ht 72.0 in | Wt 193.0 lb

## 2021-01-01 DIAGNOSIS — R590 Localized enlarged lymph nodes: Secondary | ICD-10-CM

## 2021-01-01 DIAGNOSIS — Z23 Encounter for immunization: Secondary | ICD-10-CM | POA: Diagnosis not present

## 2021-01-01 DIAGNOSIS — C760 Malignant neoplasm of head, face and neck: Secondary | ICD-10-CM | POA: Diagnosis not present

## 2021-01-01 NOTE — Patient Instructions (Signed)
-   order placed for EBUS (endobronchial ultrasound) to guide biopsy of lymph node under general anesthesia - you will be called to schedule it, hopefully procedure on Friday - keep taking aspirin as you usually do - nothing to eat or drink after midnight the night before the procedure

## 2021-01-01 NOTE — Progress Notes (Signed)
Pacemaker form faxed to device clinic at [phone]. Received confirmation that fax went through successfully.

## 2021-01-02 ENCOUNTER — Telehealth: Payer: Self-pay

## 2021-01-02 ENCOUNTER — Telehealth: Payer: Self-pay | Admitting: Student

## 2021-01-02 ENCOUNTER — Encounter (HOSPITAL_COMMUNITY): Payer: Self-pay | Admitting: Dentistry

## 2021-01-02 ENCOUNTER — Ambulatory Visit (INDEPENDENT_AMBULATORY_CARE_PROVIDER_SITE_OTHER): Payer: Medicare Other | Admitting: Dentistry

## 2021-01-02 VITALS — BP 122/75 | HR 73 | Temp 99.0°F

## 2021-01-02 DIAGNOSIS — K0850 Unsatisfactory restoration of tooth, unspecified: Secondary | ICD-10-CM

## 2021-01-02 DIAGNOSIS — K030 Excessive attrition of teeth: Secondary | ICD-10-CM

## 2021-01-02 DIAGNOSIS — M2632 Excessive spacing of fully erupted teeth: Secondary | ICD-10-CM

## 2021-01-02 DIAGNOSIS — M270 Developmental disorders of jaws: Secondary | ICD-10-CM

## 2021-01-02 DIAGNOSIS — C01 Malignant neoplasm of base of tongue: Secondary | ICD-10-CM | POA: Diagnosis present

## 2021-01-02 DIAGNOSIS — K06020 Generalized gingival recession, unspecified: Secondary | ICD-10-CM

## 2021-01-02 DIAGNOSIS — K08409 Partial loss of teeth, unspecified cause, unspecified class: Secondary | ICD-10-CM | POA: Insufficient documentation

## 2021-01-02 DIAGNOSIS — C770 Secondary and unspecified malignant neoplasm of lymph nodes of head, face and neck: Secondary | ICD-10-CM

## 2021-01-02 DIAGNOSIS — Z01818 Encounter for other preprocedural examination: Secondary | ICD-10-CM

## 2021-01-02 DIAGNOSIS — R590 Localized enlarged lymph nodes: Secondary | ICD-10-CM

## 2021-01-02 NOTE — Progress Notes (Signed)
Department of Dental Medicine        OUTPATIENT CONSULT   Service Date:   01/02/2021  Patient Name:   Steven Mathews Date of Birth:   1945/12/20 Medical Record Number: [id number]  Referring Provider:                Eppie Gibson, M.D.   PLAN/RECOMMENDATIONS   Assessment There are no current signs of acute odontogenic infection including abscess, edema or erythema, or suspicious lesion requiring biopsy.    Recommendations No dental intervention indicated prior to starting radiation at this time.   Plan Discuss case with medical team; coordinate future care & appointments as needed. Delivery of scatter protection devices (appointment TBD). Follow-up after completion of radiation therapy.  Discussed in detail all treatment options and recommendations with the patient and they are agreeable to the plan.    Thank you for consulting with [facility] and for the opportunity to participate in this patient's treatment.  Should you have any questions or concerns, please contact the [facility] at [phone].   01/02/2021 Consult Note:  COVID-19 SCREENING:  The patient denies symptoms concerning for COVID-19 infection including fever, chills, cough, or newly developed shortness of breath.   HISTORY OF PRESENT ILLNESS: Steven Mathews is a very pleasant 75 y.o. male with h/o GERD, hyperlipidemia, hypertension, chronic cough, coronary artery disease, pacemaker/defibrillator (06/2020) and atrial fibrillation who was recently diagnosed with oropharyngeal cancer and is anticipating head and neck radiation.  The patient presents today for a medically necessary dental consultation as part of their pre-radiation therapy work-up.   DENTAL HISTORY: The patient does have a primary dental office, [facility] off of [address] in [place], that he sees regularly.  His last visit was about 6-8 weeks ago to have a wisdom tooth extracted on the upper right.  He  currently denies any dental/orofacial pain or sensitivity. Patient is able to manage oral secretions. Patient denies fever, rigors and malaise.   CHIEF COMPLAINT:  Here for a pre-head and neck radiation dental exam.   Patient Active Problem List   Diagnosis Date Noted  . Malignant neoplasm of base of tongue ([place]) 12/27/2020  . Pacemaker 12/19/2020  . Neck mass 12/11/2020  . 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 Bradycardia 08/01/2020  . Heart block 06/13/2020  . Therapeutic drug monitoring 09/16/2018  . Rash 05/25/2018  . Essential hypertension 05/25/2018  . Educated about COVID-19 virus infection 05/25/2018  . Acute ST elevation myocardial infarction (STEMI) due to occlusion of right coronary artery ([place]) 03/13/2018  . Hx of CABG   . Hx of adenomatous colonic polyps 04/04/2016  . 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 SVT (supraventricular tachycardia) ([place]) 09/03/2012  . ED (erectile dysfunction) 10/05/2010  . PALPITATIONS 08/29/2008  . BRONCHITIS, ACUTE 06/24/2008  . HYPERLIPIDEMIA 04/14/2008  . Acute ST elevation myocardial infarction (STEMI) ([place]) 04/14/2008  . PULMONARY INFILTRATE INCLUDES (EOSINOPHILIA) 04/14/2008  . Pure hypercholesterolemia 04/14/2008   Past Medical History:  Diagnosis Date  . Atrial fibrillation ([place])   . CAD (coronary artery disease)    CABG 2008; 03/13/2018 NSTEMI due to acute thrombotic occlusion of RCA graft, patent LIMA to LAD.  Marland Kitchen Chronic cough   . GERD (gastroesophageal reflux disease)   . Hx of adenomatous colonic polyps 04/04/2016  . Hyperlipidemia   . Hypertension   . Myocardial infarct ([place])   . Presence of permanent cardiac pacemaker   .  SVT (supraventricular tachycardia) ([facility])    Past Surgical History:  Procedure Laterality Date  . CORONARY ARTERY BYPASS GRAFT  03/2006  . DIRECT LARYNGOSCOPY N/A 12/11/2020   Procedure: DIRECT LARYNGOSCOPY WITH BIOPSY;   Surgeon: Izora Gala, MD;  Location: [facility];  Service: ENT;  Laterality: N/A;  . ESOPHAGOSCOPY N/A 12/11/2020   Procedure: ESOPHAGOSCOPY;  Surgeon: Izora Gala, MD;  Location: [facility];  Service: ENT;  Laterality: N/A;  . [place]  . LEFT HEART CATH AND CORS/GRAFTS ANGIOGRAPHY N/A 03/13/2018   Procedure: LEFT HEART CATH AND CORS/GRAFTS ANGIOGRAPHY;  Surgeon: Troy Sine, MD;  Location: [facility] [place];  Service: Cardiovascular;  Laterality: N/A;  . PACEMAKER IMPLANT N/A 06/26/2020   Procedure: PACEMAKER IMPLANT;  Surgeon: Evans Lance, MD;  Location: [facility];  Service: Cardiovascular;  Laterality: N/A;  . RADICAL NECK DISSECTION Right 12/11/2020   Procedure: RIGHT MODIFIED NECK DISSECTION;  Surgeon: Izora Gala, MD;  Location: [facility];  Service: ENT;  Laterality: Right;  . SUPRAVENTRICULAR TACHYCARDIA ABLATION N/A 09/03/2012   Procedure: SUPRAVENTRICULAR TACHYCARDIA ABLATION;  Surgeon: Evans Lance, MD;  Location[facility] C[facility];  Service: Cardiovascular;  Laterality: N/A;  . TONSILLECTOMY Bilateral 12/11/2020   Procedure: BILATERAL TONSILLECTOMY;  Surgeon: Izora Gala, MD;  Location: [facility];  Service: ENT;  Laterality: Bilateral;   No Known Allergies Current Outpatient Medications  Medication Sig Dispense Refill  . aspirin 81 MG chewable tablet Chew 1 tablet (81 mg total) by mouth daily. 90 tablet 3  . cholecalciferol (VITAMIN D3) 25 MCG (1000 UNIT) tablet Take 1,000 Units by mouth once a week.    Marland Kitchen HYDROcodone-acetaminophen (HYCET) 7.5-325 mg/15 ml solution Take 15 mLs by mouth every 6 (six) hours as needed for moderate pain. 420 mL 0  . metoprolol succinate (TOPROL-XL) 50 MG 24 hr tablet Take 1 tablet (50 mg total) by mouth daily. Take with or immediately following a meal. 90 tablet 3  . omeprazole (PRILOSEC) 40 MG capsule Take 40 mg by mouth daily.    Marland Kitchen OVER THE COUNTER MEDICATION Take 2 tablets by mouth at bedtime as needed (sleep). Relaxium Sleep    .  rosuvastatin (CRESTOR) 40 MG tablet Take 1 tablet (40 mg total) by mouth daily. 90 tablet 3  . sildenafil (VIAGRA) 50 MG tablet Take 1 tablet (50 mg total) by mouth daily as needed for erectile dysfunction. 20 tablet 3   No current facility-administered medications for this visit.    LABS: Lab Results  Component Value Date   WBC 7.0 12/07/2020   HGB 13.4 12/07/2020   HCT 41.2 12/07/2020   MCV 98.8 12/07/2020   PLT 219 12/07/2020      Component Value Date/Time   NA 140 12/07/2020 1037   NA 140 06/13/2020 0913   K 4.9 12/07/2020 1037   CL 107 12/07/2020 1037   CO2 27 12/07/2020 1037   GLUCOSE 116 (H) 12/07/2020 1037   BUN 25 (H) 12/07/2020 1037   BUN 16 06/13/2020 0913   CREATININE 1.24 12/07/2020 1037   CALCIUM 9.5 12/07/2020 1037   GFRNONAA >60 12/07/2020 1037   GFRAA >60 03/16/2018 0237   Lab Results  Component Value Date   INR 1.09 03/13/2018   No results found for: PTT  Social History   Socioeconomic History  . Marital status: Married    Spouse name: Not on file  . Number of children: Not on file  . Years of education: Not on file  .  Highest education level: Not on file  Occupational History  . Not on file  Tobacco Use  . Smoking status: Never  . Smokeless tobacco: Never  Vaping Use  . Vaping Use: Never used  Substance and Sexual Activity  . Alcohol use: Yes    Comment: 2-3 glasses wine/day  . Drug use: No  . Sexual activity: Not on file  Other Topics Concern  . Not on file  Social History Narrative   Right handed   Two story home   Drinks caffeine   Social Determinants of Health   Financial Resource Strain: Not on file  Food Insecurity: Not on file  Transportation Needs: Not on file  Physical Activity: Not on file  Stress: Not on file  Social Connections: Not on file  Intimate Partner Violence: Not on file   Family History  Problem Relation Age of Onset  . Liver cancer Father   . Cancer Mother        bladder cancer  . Colon cancer  Brother 27     REVIEW OF SYSTEMS:  Reviewed with the patient as per HPI. Psych: Patient denies having dental phobia.   VITAL SIGNS: BP 122/75 (BP Location: Right Arm, Patient Position: Sitting, Cuff Size: Normal)   Pulse 73   Temp 99 F (37.2 C) (Oral)    PHYSICAL EXAM: General:  Well-developed, comfortable and in no apparent distress. Neurological:  Alert and oriented to person, place and  time. Extraoral:  Facial symmetry present without any edema or erythema.  TMJ asymptomatic without clicks or crepitations.  (+) Lymphadenopathy/edema:  Right side of neck lymphedema notable upon palpation (consistent with recent postoperative course)    Maximum Interincisal Opening:  55 mm Intraoral:  Soft tissues appear well-perfused and mucous membranes moist.  FOM and vestibules soft and not raised. Oral cavity without mass or lesion. No signs of infection, parulis, sinus tract, edema or erythema evident upon exam.  Small palatal tori.   DENTAL EXAM: Hard tissue exam completed and charted.    Overall impression:  Good remaining dentition.    Oral hygiene:  Good   Periodontal:  Pink, healthy gingival tissue with blunted papilla. Generalized gingival recession. Defective restorations:  #29 existing resin with void/defect that catches with explorer Endodontics:   #3 has been previously root canal treated with definitive restoration Removable/fixed prosthodontics:  #19 and #30 PFM full-coverage crowns, #3 all-ceramic crown Occlusion:  Class I molar occlusion Non-functional teeth:  #32 Other findings:   (+) Attrition/wear: #23, #24, #25 (+) Diastema: 78   RADIOGRAPHIC EXAM:  Full Mouth Series exposed and interpreted. PAN imported from outside office (taken on 09/06/20) was interpreted.    Condyles seated bilaterally in fossas.  No evidence of abnormal pathology.  All visualized osseous structures appear WNL. Missing tooth #1; evidence of recent extraction site.    Generalized mild  horizontal bone loss consistent with mild periodontitis vs recession on a healthy periodontium. Existing restorations evident on teeth #'s 2, 4, 5, 12, 13, 14, 15, 16, 17, 18, 29, 31 & 32. Teeth numbers 3, 19 & 30 have full-coverage restorations; #3 has been previously endodontically treated with gutta percha fill appearing adequate w/ out significant voids.   ASSESSMENT:  1.  SCC of oropharynx 2.  Malignant neoplasm of base of tongue 3.  Pre-head and neck radiation therapy dental exam 4.  Missing tooth 5.  Gingival recession, generalized 6.  Defective restoration 7.  Attrition/wear 8.  Diastema(s) 9.  Palatal tori   PROCEDURES:  The common and significant side effects of radiation therapy to the head and neck were explained and discussed with the patient.  The discussion included side effects of trismus (limited opening), dysgeusia (loss of taste), xerostomia (dry mouth), radiation caries and osteoradionecrosis of the jaw.  I also discussed the importance of maintaining optimal oral hygiene and oral health before, during and after radiation to decrease the risk of developing radiation cavities and the need for any surgery such as extractions after therapy.    Upper and Lower alginate impressions taken and poured up in Type IV Microstone for fabrication of scatter protection devices and fluoride trays.   Trismus appliance made using patient's baseline MIO (27 sticks).  Leta Speller, DAII demonstrated use of appliance.  Verbal and written postop instructions were given to the patient.   PLAN AND RECOMMENDATIONS: I discussed the risks, benefits, and complications of various scenarios with the patient in relationship to their medical and dental conditions, which included systemic infection or other serious issues such as osteoradionecrosis that could potentially occur either before, during or after their anticipated radiation therapy if dental/oral concerns are not addressed.  I explained that if  any chronic or acute dental/oral infection(s) are addressed and subsequently not maintained following medical optimization and recovery, their risk of the previously mentioned complications are just as high and could potentially occur postoperatively.  I explained all significant findings of the dental consultation with the patient, and the recommended care including maintaining optimal oral hygiene and continuing to see his primary dentist once he completes cancer treatment in order to optimize them following radiation from a dental standpoint.  We also discussed that we will see him in our dental clinic once he finishes radiation to evaluate his side effects and to subsequently refer him back to his dentist with information regarding his recent medical history.  The patient verbalized understanding of all findings, discussion, and recommendations. We then discussed treatment options including scatter protection devices and fluoride trays which we will make off of the models made today.  I explained that we will see him prior to his simulation appointment (right now this is TBD) to deliver SPD's and we will give him fluoride trays after he finishes radiation (also TBD).  He verbalized understanding and is agreeable to this plan. Plan to discuss all findings and recommendations with medical team and coordinate future care as needed.   The patient will need to return to their regular dentist for routine dental care including replacement of missing teeth as needed, cleanings and exams.  All questions and concerns were invited and addressed.  The patient tolerated today's visit well and departed in stable condition.  I spent in excess of 120 minutes during the conduct of this consultation and >50% of this time involved direct face-to-face encounter for counseling and/or coordination of the patient's care.      [place] Benson [place], D.M.D.

## 2021-01-02 NOTE — Telephone Encounter (Signed)
I scheduled pt for 12/5 at 2:00.  Pt will go for covid test on 12/2.  Gave appt info to pt.  He has questions about when he will have results.  Would like someone to call him.

## 2021-01-02 NOTE — Telephone Encounter (Signed)
-----   Message from [name], MD sent at 01/01/2021 10:07 AM EST ----- Regarding: ebus Please schedule the following:  Provider performing procedure:Meier Diagnosis: mediastinal lymphadenopathy Which side if for nodule / mass? 11R/10R Procedure: EBUS  Has patient been spoken to by Provider and given informed consent? yes Anesthesia: general Do you need Fluro? no Duration of procedure: 45 minutes Date: prefer 12/2 Alternate Date: 12/1  Time: Prefer PM Location: [facility] or [facility] are fine Does patient have OSA? no DM? no Or Latex allergy? no Medication Restriction: no Anticoagulate/Antiplatelet: he is to continue taking aspirin without interruption Pre-op Labs Ordered:determined by Anesthesia Imaging request: none  (If, SuperDimension CT Chest, please have STAT courier sent to ENDO)  Please coordinate Pre-op COVID Testing

## 2021-01-02 NOTE — Telephone Encounter (Signed)
Please schedule the following:   Provider performing procedure:Meier  Diagnosis: mediastinal lymphadenopathy  Which side if for nodule / mass? 11R/10R  Procedure: EBUS  Has patient been spoken to by Provider and given informed consent? yes  Anesthesia: general  Do you need Fluro? no  Duration of procedure: 45 minutes  Date: prefer 12/2  Alternate Date: 12/1  Time: Prefer PM  Location: [facility] or [facility] are fine  Does patient have OSA? no DM? no Or Latex allergy? no  Medication Restriction: no  Anticoagulate/Antiplatelet: he is to continue taking aspirin without interruption  Pre-op Labs Ordered:determined by Anesthesia  Imaging request: none  (If, SuperDimension CT Chest, please have STAT courier sent to ENDO)   Please coordinate Pre-op COVID Testing

## 2021-01-02 NOTE — Patient Instructions (Signed)
[place] Benson Norway, D.M.D. Phone: [phone] Fax: [phone]   It was a pleasure seeing you today!  Please refer to the information below regarding your dental visit with [place].  Call if you have any questions or concerns that come up after you leave.   Thank you for letting us provide care for you.  If there is anything we can do for you, please let us know.     RADIATION THERAPY AND INFORMATION REGARDING YOUR TEETH   XEROSTOMIA (dry mouth):  Your salivary glands may be in the field of radiation.  Radiation may include all or only part of your salivary glands.  This will cause your saliva to dry up, and you will have a dry mouth.  The dry mouth will be for the rest of your life unless your radiation oncologist tells you otherwise.       Your saliva has many functions: It wets your tongue for speaking. It coats your teeth and the inside of your mouth for easier movement. It helps with chewing and swallowing food. It helps clean away harmful acid and toxic products made by the germs in your mouth, therefore it helps prevent cavities. It kills some germs in your mouth and helps to prevent gum disease. It helps to carry flavor to your taste buds.       Once you have lost your saliva, you will be at higher risk for tooth decay and gum disease.         What can be done to help improve your mouth when there's not enough saliva? Your dentist may give a recommendation for CLoSYS.  It will not bring back all of your saliva but may bring back some of it.  Also, your saliva may be thick and ropy or white and foamy.  It will not feel like it use to feel. You will need to swish with water every time your mouth feels dry.  YOU CANNOT suck on any cough drops, mints, lemon drops, candy, vitamin C or any other products.  You cannot use anything other than water to make your mouth feel less dry.  If you want to drink anything else, you have to drink it all at  once and brush afterwards.  Be sure to discuss the details of your diet habits with your dentist or hygienist.   RADIATION CARIES:  This is decay (cavities) that happens very quickly  once your mouth is very dry due to radiation therapy.  Normally, cavities take six months to two years to become a problem.  When you have dry mouth, cavities may take as little as eight weeks to cause you a problem.    Dental check-ups every two months are necessary as long as you have a dry mouth. Radiation caries typically, but not always, start at your gum line where it is hard to see the cavity.  It is therefore also hard to fill these cavities adequately.  This high rate of cavities happens because your mouth no longer has saliva and therefore the acid made by the germs starts the decay process.  Whenever you eat anything the germs in your mouth change the food into acid.  The acid then burns a small hole in your tooth.  This small hole is the beginning of a cavity.  If this is not treated then it will grow bigger and become a cavity.  The way to avoid this hole getting bigger is to use fluoride  every evening as prescribed by your dentist following your radiation. NOTE:  You have to make sure that your teeth are very clean before you use the fluoride.  This fluoride in turn will strengthen your teeth and prepare them for another day of fighting acid. If you develop radiation caries many times, the damage is so large that you will have to have all your teeth removed.  This could be a big problem if some of these teeth are in the field of radiation.  Further details of why this could be a big problem will follow (see Osteoradionecrosis below).   DYSGEUSIA (loss of taste):  This happens to varying degrees once you've had radiation therapy to your jaw region.  Many times taste is not completely lost, but becomes limited.  The loss of taste is mostly due to radiation affecting your taste buds.  However, if you have no saliva  in your mouth to carry the flavor to your taste buds, it would be difficult for your taste buds to taste anything.  That is why using water or a prescription for [place] prior to meals and during meal times may help with some of the taste.  Keep in mind that taste generally returns very slowly over the course of several months or several years after radiation therapy.  Don't give up hope.   TRISMUS (limited jaw opening):  According to your radiation oncologist, your TMJ or jaw joints are going to be partially or fully in the field of radiation.  This means that over time the muscles that help you open and close your mouth may get stiff.  This will potentially result in your not being able to open your mouth wide enough or as wide as you can open it now.         Let me give you an example of how slowly this happens and how unaware people are of it:      A gentlemen that had radiation therapy two years ago came back to me complaining that bananas are just too large for him to be able to fit them in between his teeth.  He was not able to open wide enough to bite into a banana.  This happens slowly and over a period of time.       What we do to try and prevent this:   Your dentist will probably give you a stack of sticks called a trismus exercise device.  This stack will help remind your muscles and your jaw joints to open up to the same distance every day.  Use these sticks every morning when you wake up, or according to the instructions given by your dentist.    You must use these sticks for at least one to two years after radiation therapy.  The reason for that is because it happens so slowly and keeps going on for about two years after radiation therapy.  Your hospital dentist will help you monitor your mouth opening and make sure that it's not getting smaller after radiation.  TRISMUS EXERCISES: Using the stack of sticks given to you by your dentist, place the stack in your mouth and hold onto the  other end for support. Leave the sticks in your mouth while holding the other end.  Allow 30 seconds for muscle stretching. Rest for a few seconds. Repeat 3-5 times. This exercise is recommended in the mornings and evenings unless otherwise instructed. The exercise should be done for a period of 2 YEARS  after the end of radiation. Your maximum jaw opening should be checked regularly at recall dental visits by your general dentist. You should report any changes, soreness, or difficulties encountered when doing the exercises to your dentist.   OSTEORADIONECROSIS (ORN):  This is a condition where your jaw bone after radiation therapy becomes very dry.  It has very little blood supply to keep it alive.  If you develop a cavity that turns into an abscess or an infection, then the jaw bone does not have enough blood supply to help fight the infection.  At this point it is very likely that the infection could cause the death of your jaw bone.  When you have dead bone it has to be removed.  Therefore, you might end up having to have surgery to remove part of your jaw bone, the part of the jaw bone that has been affected.     Healing is also a problem if you are to have surgery (like a tooth extraction) in the areas where the bone has had radiation therapy.  If you have surgery, you need more blood supply to heal which is not available.  When blood supply and oxygen are not available, there is a chance for the bone to die. Occasionally, ORN happens on its own with no obvious reason, but this is quite rare.  We believe that patients who continue to smoke and/or drink alcohol have a higher chance of having this problem. Once your jaw bone has had radiation therapy, if there are any remaining teeth in that area, it is not recommended to have them pulled unless your dentist or oral surgeon is aware of your history of radiation and believes it is safe.  The risks for ORN either from infection or spontaneously  occurring (with no reason) are life long.    QUESTIONS?  Call our office during office hours at [phone].

## 2021-01-04 NOTE — Progress Notes (Signed)
Remote pacemaker transmission.   

## 2021-01-04 NOTE — Telephone Encounter (Signed)
Called on 11/29 and discussed, all questions addressed.

## 2021-01-05 ENCOUNTER — Encounter: Payer: Self-pay | Admitting: Internal Medicine

## 2021-01-05 ENCOUNTER — Other Ambulatory Visit: Payer: Self-pay | Admitting: Student

## 2021-01-05 ENCOUNTER — Encounter: Payer: Self-pay | Admitting: Radiation Oncology

## 2021-01-05 NOTE — Progress Notes (Signed)
This week, I spoke with Dr. Hendricks Limes and Dr. Melina Copa about this patient's case.  There is no clear evidence of extracapsular extension according to Dr. Melina Copa.  Dr. Roque Cash I agree that there is no clear benefit to further surgery, since radiation therapy would be indicated regardless as part of his treatment protocol.  Therefore, our consensus is that the patient could should proceed with adjuvant radiation therapy to the throat and bilateral neck.  Given the lack of clear evidence for extracapsular extension I do not see a role for chemotherapy.  Anderson Malta, our navigator, is going to call the patient to schedule CT simulation and also give him a choice as to whether he still wants to proceed with a medical oncology consultation or not, provided the information above.  -----------------------------------  Eppie Gibson, MD

## 2021-01-05 NOTE — Progress Notes (Signed)
[facility] DEVICE PROGRAMMING  Patient Information: Name:  Arkeem Harts  DOB:  December 26, 1945  MRN:  [id number]    Danna Hefty, RN  P Cv Div Heartcare Device Planned Procedure:  EBUS  Surgeon:  Dr. Verlee Monte  Date of Procedure:  01/08/21  Cautery will be used.  Position during surgery:  supine   Please send documentation back to:  [facility] (Fax # [phone])   Hope Budds, RN  01/05/2021 7:38 AM     Device Information:  Clinic EP Physician:  Cristopher Peru, MD   Device Type:  Pacemaker Manufacturer and Phone #:  Medtronic: [phone] Pacemaker Dependent?:  No. Date of Last Device Check:  12/27/20 Normal Device Function?:  Yes.    Electrophysiologist's Recommendations:  Have magnet available. Provide continuous ECG monitoring when magnet is used or reprogramming is to be performed.  Procedure may interfere with device function.  Magnet should be placed over device during procedure.  Per Device Clinic Standing Orders, York Ram, RN  1:23 PM 01/05/2021

## 2021-01-06 LAB — SARS CORONAVIRUS 2 (TAT 6-24 HRS): SARS Coronavirus 2: NEGATIVE

## 2021-01-08 ENCOUNTER — Other Ambulatory Visit: Payer: Self-pay

## 2021-01-08 ENCOUNTER — Encounter (HOSPITAL_COMMUNITY): Payer: Self-pay | Admitting: Student

## 2021-01-08 ENCOUNTER — Ambulatory Visit (HOSPITAL_COMMUNITY)
Admission: RE | Admit: 2021-01-08 | Discharge: 2021-01-08 | Disposition: A | Discharge status: Home / Self Care | Payer: Medicare (Managed Care) | Attending: Student | Admitting: Student

## 2021-01-08 ENCOUNTER — Ambulatory Visit (HOSPITAL_COMMUNITY): Payer: Medicare (Managed Care) | Admitting: Certified Registered Nurse Anesthetist

## 2021-01-08 ENCOUNTER — Encounter (HOSPITAL_COMMUNITY)
Admission: RE | Disposition: A | Discharge status: Home / Self Care | Payer: Self-pay | Source: Home / Self Care | Attending: Student

## 2021-01-08 ENCOUNTER — Telehealth: Payer: Self-pay | Admitting: *Deleted

## 2021-01-08 DIAGNOSIS — Z950 Presence of cardiac pacemaker: Secondary | ICD-10-CM | POA: Diagnosis not present

## 2021-01-08 DIAGNOSIS — I4891 Unspecified atrial fibrillation: Secondary | ICD-10-CM | POA: Diagnosis not present

## 2021-01-08 DIAGNOSIS — R590 Localized enlarged lymph nodes: Secondary | ICD-10-CM

## 2021-01-08 DIAGNOSIS — K219 Gastro-esophageal reflux disease without esophagitis: Secondary | ICD-10-CM | POA: Insufficient documentation

## 2021-01-08 DIAGNOSIS — R5383 Other fatigue: Secondary | ICD-10-CM

## 2021-01-08 DIAGNOSIS — R918 Other nonspecific abnormal finding of lung field: Secondary | ICD-10-CM | POA: Diagnosis not present

## 2021-01-08 DIAGNOSIS — C01 Malignant neoplasm of base of tongue: Secondary | ICD-10-CM

## 2021-01-08 DIAGNOSIS — Z951 Presence of aortocoronary bypass graft: Secondary | ICD-10-CM | POA: Diagnosis not present

## 2021-01-08 DIAGNOSIS — I10 Essential (primary) hypertension: Secondary | ICD-10-CM | POA: Insufficient documentation

## 2021-01-08 DIAGNOSIS — I2510 Atherosclerotic heart disease of native coronary artery without angina pectoris: Secondary | ICD-10-CM | POA: Diagnosis not present

## 2021-01-08 HISTORY — PX: ENDOBRONCHIAL ULTRASOUND: SHX5096

## 2021-01-08 HISTORY — PX: FINE NEEDLE ASPIRATION: SHX6590

## 2021-01-08 HISTORY — PX: VIDEO BRONCHOSCOPY: SHX5072

## 2021-01-08 SURGERY — ENDOBRONCHIAL ULTRASOUND (EBUS)
Anesthesia: General

## 2021-01-08 MED ORDER — SUGAMMADEX SODIUM 200 MG/2ML IV SOLN
INTRAVENOUS | Status: DC | PRN
  Administered 2021-01-08: 200 mg via INTRAVENOUS

## 2021-01-08 MED ORDER — SUCCINYLCHOLINE CHLORIDE 200 MG/10ML IV SOSY
PREFILLED_SYRINGE | INTRAVENOUS | Status: DC | PRN
  Administered 2021-01-08: 120 mg via INTRAVENOUS

## 2021-01-08 MED ORDER — FENTANYL CITRATE (PF) 100 MCG/2ML IJ SOLN
INTRAMUSCULAR | Status: AC
  Filled 2021-01-08: qty 2

## 2021-01-08 MED ORDER — PROPOFOL 10 MG/ML IV BOLUS
INTRAVENOUS | Status: DC | PRN
  Administered 2021-01-08: 130 mg via INTRAVENOUS

## 2021-01-08 MED ORDER — LACTATED RINGERS IV SOLN
INTRAVENOUS | Status: DC
  Administered 2021-01-08: 1000 mL via INTRAVENOUS

## 2021-01-08 MED ORDER — PHENYLEPHRINE HCL-NACL 20-0.9 MG/250ML-% IV SOLN
INTRAVENOUS | Status: DC | PRN
  Administered 2021-01-08: 25 ug/min via INTRAVENOUS

## 2021-01-08 MED ORDER — FENTANYL CITRATE (PF) 100 MCG/2ML IJ SOLN
INTRAMUSCULAR | Status: DC | PRN
  Administered 2021-01-08 (×3): 50 ug via INTRAVENOUS

## 2021-01-08 MED ORDER — DEXAMETHASONE SODIUM PHOSPHATE 10 MG/ML IJ SOLN
INTRAMUSCULAR | Status: DC | PRN
  Administered 2021-01-08: 5 mg via INTRAVENOUS

## 2021-01-08 MED ORDER — PHENYLEPHRINE 40 MCG/ML (10ML) SYRINGE FOR IV PUSH (FOR BLOOD PRESSURE SUPPORT)
PREFILLED_SYRINGE | INTRAVENOUS | Status: DC | PRN
  Administered 2021-01-08 (×2): 80 ug via INTRAVENOUS

## 2021-01-08 MED ORDER — ROCURONIUM BROMIDE 10 MG/ML (PF) SYRINGE
PREFILLED_SYRINGE | INTRAVENOUS | Status: DC | PRN
  Administered 2021-01-08: 40 mg via INTRAVENOUS
  Administered 2021-01-08: 10 mg via INTRAVENOUS

## 2021-01-08 MED ORDER — LIDOCAINE 2% (20 MG/ML) 5 ML SYRINGE
INTRAMUSCULAR | Status: DC | PRN
  Administered 2021-01-08: 60 mg via INTRAVENOUS
  Administered 2021-01-08: 1 mg via INTRAVENOUS

## 2021-01-08 MED ORDER — ONDANSETRON HCL 4 MG/2ML IJ SOLN
INTRAMUSCULAR | Status: DC | PRN
  Administered 2021-01-08: 4 mg via INTRAVENOUS

## 2021-01-08 NOTE — Transfer of Care (Signed)
Immediate Anesthesia Transfer of Care Note  Patient: Steven Mathews  Procedure(s) Performed: ENDOBRONCHIAL ULTRASOUND FINE NEEDLE ASPIRATION VIDEO BRONCHOSCOPY WITHOUT FLUORO  Patient Location: PACU and Endoscopy Unit  Anesthesia Type:General  Level of Consciousness: awake, alert  and oriented  Airway & Oxygen Therapy: Patient Spontanous Breathing and Patient connected to face mask oxygen  Post-op Assessment: Report given to RN and Post -op Vital signs reviewed and stable  Post vital signs: Reviewed and stable  Last Vitals:  Vitals Value Taken Time  BP 137/80 01/08/21 1603  Temp    Pulse 76 01/08/21 1605  Resp 25 01/08/21 1605  SpO2 100 % 01/08/21 1605  Vitals shown include unvalidated device data.  Last Pain:  Vitals:   01/08/21 1603  TempSrc:   PainSc: Asleep         Complications: No notable events documented.

## 2021-01-08 NOTE — Anesthesia Preprocedure Evaluation (Addendum)
Anesthesia Evaluation  Patient identified by MRN, date of birth, ID band Patient awake    Reviewed: Allergy & Precautions, NPO status , Patient's Chart, lab work & pertinent test results  Airway Mallampati: III  TM Distance: >3 FB Neck ROM: Full    Dental  (+) Teeth Intact, Dental Advisory Given   Pulmonary neg pulmonary ROS,    breath sounds clear to auscultation       Cardiovascular hypertension, + CAD and + CABG  + dysrhythmias Atrial Fibrillation + pacemaker  Rhythm:Regular Rate:Normal  Echo: 1. The left ventricle has normal systolic function of 75-17%. The cavity  size was normal. There is concentric left ventricular hypertrophy. Echo  evidence of normal diastolic relaxation.  2. The right ventricle has moderately reduced systolic function. The  cavity was mildly enlarged. There is no increase in right ventricular wall  thickness.  3. The mitral valve is normal in structure. There is mild thickening.  There is mild mitral annular calcification present.  4. The tricuspid valve is normal in structure.  5. The aortic valve is tricuspid There is mild thickening and sclerosis  without any evidence of stenosis of the aortic valve.  6. There is mild dilatation of the aortic root.  7. The inferior vena cava was dilated in size with >50% respiratory  variability.  8. No evidence of left ventricular regional wall motion abnormalities.    Neuro/Psych negative neurological ROS  negative psych ROS   GI/Hepatic Neg liver ROS, GERD  ,  Endo/Other  negative endocrine ROS  Renal/GU negative Renal ROS     Musculoskeletal negative musculoskeletal ROS (+)   Abdominal Normal abdominal exam  (+)   Peds  Hematology negative hematology ROS (+)   Anesthesia Other Findings   Reproductive/Obstetrics                            Anesthesia Physical Anesthesia Plan  ASA: 3  Anesthesia Plan: General    Post-op Pain Management:    Induction: Intravenous  PONV Risk Score and Plan: 2 and Ondansetron, Dexamethasone and Treatment may vary due to age or medical condition  Airway Management Planned: Oral ETT  Additional Equipment: None  Intra-op Plan:   Post-operative Plan: Extubation in OR  Informed Consent: I have reviewed the patients History and Physical, chart, labs and discussed the procedure including the risks, benefits and alternatives for the proposed anesthesia with the patient or authorized representative who has indicated his/her understanding and acceptance.     Dental advisory given  Plan Discussed with: CRNA  Anesthesia Plan Comments:         Anesthesia Quick Evaluation

## 2021-01-08 NOTE — Anesthesia Postprocedure Evaluation (Signed)
Anesthesia Post Note  Patient: Castulo Scarpelli  Procedure(s) Performed: ENDOBRONCHIAL ULTRASOUND FINE NEEDLE ASPIRATION VIDEO BRONCHOSCOPY WITHOUT FLUORO     Patient location during evaluation: PACU Anesthesia Type: General Level of consciousness: awake and alert Pain management: pain level controlled Vital Signs Assessment: post-procedure vital signs reviewed and stable Respiratory status: spontaneous breathing, nonlabored ventilation, respiratory function stable and patient connected to nasal cannula oxygen Cardiovascular status: blood pressure returned to baseline and stable Postop Assessment: no apparent nausea or vomiting Anesthetic complications: no   No notable events documented.  Last Vitals:  Vitals:   01/08/21 1613 01/08/21 1623  BP: 137/70 (!) 153/82  Pulse: 73 72  Resp: 19 11  Temp:    SpO2: 100% 96%    Last Pain:  Vitals:   01/08/21 1623  TempSrc:   PainSc: 2                  Effie Berkshire

## 2021-01-08 NOTE — Anesthesia Procedure Notes (Addendum)
Procedure Name: Intubation Date/Time: 01/08/2021 2:11 PM Performed by: West Pugh, CRNA Pre-anesthesia Checklist: Patient identified, Emergency Drugs available, Suction available, Patient being monitored and Timeout performed Patient Re-evaluated:Patient Re-evaluated prior to induction Oxygen Delivery Method: Circle system utilized Preoxygenation: Pre-oxygenation with 100% oxygen Induction Type: IV induction Ventilation: Mask ventilation without difficulty Laryngoscope Size: Glidescope and 4 Grade View: Grade I Tube type: Oral Tube size: 8.5 mm Number of attempts: 1 Airway Equipment and Method: Rigid stylet and Video-laryngoscopy Placement Confirmation: ETT inserted through vocal cords under direct vision, positive ETCO2, CO2 detector and breath sounds checked- equal and bilateral Secured at: 22 cm Tube secured with: Tape Dental Injury: Teeth and Oropharynx as per pre-operative assessment  Comments: Elective GS d/t hx of prior neck surgery

## 2021-01-08 NOTE — Op Note (Signed)
Flexible and EBUS Bronchoscopy Procedure Note  Steven Mathews  [id number]  10/14/1945  Date:01/08/21  Time:4:24 PM   Provider Performing:Helaman Mecca Jerilynn Mages Verlee Monte   Procedure: Flexible bronchoscopy and EBUS Bronchoscopy  Indication(s) Right hilar lymphadenopathy  Consent Risks of the procedure as well as the alternatives and risks of each were explained to the patient and/or caregiver.  Consent for the procedure was obtained.  Anesthesia General Anesthesia   Time Out Verified patient identification, verified procedure, site/side was marked, verified correct patient position, special equipment/implants available, medications/allergies/relevant history reviewed, required imaging and test results available.   Sterile Technique Usual hand hygiene, masks, gowns, and gloves were used   Procedure Description Diagnostic bronchoscope advanced through endotracheal tube and into airway.  Airways were examined down to subsegmental level with findings noted below.  The diagnostic bronchoscope was then removed and the EBUS bronchoscope was advanced into airway with stations 11R, 7S, 4R biopsied and sent for slide, cell block, and/or culture.  The EBUS bronchoscope was removed after assuring no active bleeding from biopsy site.  Findings:  - 371m 11R lymph node s/p EBNA with 7 passes - oblong 122m7S lymph node s/p EBNA with 4 passes - 71m25mR EBNA attempted but specimen of likely poor quality - unremarkable airway exam    Complications/Tolerance None; patient tolerated the procedure well. Chest X-ray is not needed post procedure.   EBL Minimal   Specimen(s) EBNA sent for cyto as above

## 2021-01-08 NOTE — Discharge Instructions (Signed)
As long as you're only coughing bloody streaks or little clots of blood, can restart aspirin tomorrow

## 2021-01-08 NOTE — Interval H&P Note (Signed)
History and Physical Interval Note:  01/08/2021 1:53 PM  Steven Mathews  has presented today for surgery, with the diagnosis of right hilar mass.  The various methods of treatment have been discussed with the patient and family. After consideration of risks, benefits and other options for treatment, the patient has consented to  Procedure(s): ENDOBRONCHIAL ULTRASOUND (N/A) as a surgical intervention.  The patient's history has been reviewed, patient examined, no change in status, stable for surgery.  I have reviewed the patient's chart and labs.  Questions were answered to the patient's satisfaction.     [name]

## 2021-01-08 NOTE — Telephone Encounter (Signed)
Called patient to ask about coming in for lab, lab stated that he wants to come tomorrow @ 10 am, appt. has been arranged and the patient is aware of this appt.

## 2021-01-09 ENCOUNTER — Encounter (HOSPITAL_COMMUNITY): Payer: Self-pay | Admitting: Student

## 2021-01-09 ENCOUNTER — Ambulatory Visit
Admission: RE | Admit: 2021-01-09 | Discharge: 2021-01-09 | Disposition: A | Discharge status: Home / Self Care | Payer: Medicare (Managed Care) | Source: Ambulatory Visit | Attending: Radiation Oncology | Admitting: Radiation Oncology

## 2021-01-09 DIAGNOSIS — Z510 Encounter for antineoplastic radiation therapy: Secondary | ICD-10-CM | POA: Insufficient documentation

## 2021-01-09 DIAGNOSIS — C01 Malignant neoplasm of base of tongue: Secondary | ICD-10-CM | POA: Diagnosis not present

## 2021-01-09 DIAGNOSIS — Z79899 Other long term (current) drug therapy: Secondary | ICD-10-CM | POA: Diagnosis not present

## 2021-01-09 DIAGNOSIS — R5383 Other fatigue: Secondary | ICD-10-CM

## 2021-01-09 DIAGNOSIS — R5381 Other malaise: Secondary | ICD-10-CM

## 2021-01-09 LAB — BUN & CREATININE (CHCC)
BUN: 14 mg/dL (ref 8–23)
Creatinine: 1.13 mg/dL (ref 0.61–1.24)
GFR, Estimated: >60 mL/min (ref >60)

## 2021-01-09 LAB — CYTOLOGY - NON PAP

## 2021-01-09 LAB — TSH: TSH: 0.536 u[IU]/mL (ref 0.320–4.118)

## 2021-01-10 ENCOUNTER — Ambulatory Visit
Admission: RE | Admit: 2021-01-10 | Discharge: 2021-01-10 | Disposition: A | Discharge status: Home / Self Care | Payer: Medicare (Managed Care) | Source: Ambulatory Visit | Attending: Radiation Oncology | Admitting: Radiation Oncology

## 2021-01-10 ENCOUNTER — Other Ambulatory Visit: Payer: Self-pay

## 2021-01-10 ENCOUNTER — Ambulatory Visit (INDEPENDENT_AMBULATORY_CARE_PROVIDER_SITE_OTHER): Payer: Medicare (Managed Care) | Admitting: Dentistry

## 2021-01-10 VITALS — BP 126/66 | HR 60 | Temp 98.4°F

## 2021-01-10 VITALS — BP 143/73 | HR 63 | Temp 97.7°F | Resp 20 | Ht 72.0 in | Wt 190.6 lb

## 2021-01-10 DIAGNOSIS — C01 Malignant neoplasm of base of tongue: Secondary | ICD-10-CM

## 2021-01-10 DIAGNOSIS — Z463 Encounter for fitting and adjustment of dental prosthetic device: Secondary | ICD-10-CM

## 2021-01-10 DIAGNOSIS — Z510 Encounter for antineoplastic radiation therapy: Secondary | ICD-10-CM | POA: Diagnosis not present

## 2021-01-10 MED ORDER — SODIUM CHLORIDE 0.9% FLUSH
10.0000 mL | Freq: Once | INTRAVENOUS | Status: AC
  Administered 2021-01-10: 10 mL via INTRAVENOUS

## 2021-01-10 NOTE — Progress Notes (Signed)
Oncology Nurse Navigator Documentation   To provide support, encouragement and care continuity, met with Steven Mathews during his CT [facility].  He tolerated procedure without difficulty, denied questions/concerns.   I discussed scheduling him for the upcoming head and neck MDC on 12/15 and he was agreeable to attend I encouraged him to call me anytime if he had any questions or concerns.  Harlow Asa RN, BSN, OCN Head & Neck Oncology Nurse [place] at [facility] Phone # [phone]  Fax # [phone]

## 2021-01-10 NOTE — Progress Notes (Signed)
Department of Dental Medicine     INTRAORAL DEVICE: DELIVERY   Service Date:   01/10/2021  Patient Name:   Steven Mathews Date of Birth:   Mar 03, 1945 Medical Record Number: [id number]   TODAY'S VISIT   Procedures: Delivered upper and lower scatter protection devices.  Plan: Follow-up status post radiation therapy.   01/10/2021 Progress Note:  COVID-19 SCREENING:  The patient denies symptoms concerning for COVID-19 infection including fever, chills, cough, or newly developed shortness of breath.   HISTORY OF PRESENT ILLNESS: Steven Mathews presents today for delivery of upper and lower scatter protection devices. Medical and dental history reviewed with the patient.  His simulation is scheduled for 2:45PM today after this appointment.   CHIEF COMPLAINT:   Here for a routine dental appointment.   Patient Active Problem List   Diagnosis Date Noted  . Hilar lymphadenopathy   . Encounter for preoperative dental examination 01/02/2021  . Tooth missing 01/02/2021  . Gingival recession, generalized 01/02/2021  . Defective dental restoration 01/02/2021  . Dental attrition, excessive 01/02/2021  . Diastema 01/02/2021  . 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 Pacemaker 12/19/2020  . Neck mass 12/11/2020  . 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 Bradycardia 08/01/2020  . Heart block 06/13/2020  . Therapeutic drug monitoring 09/16/2018  . Rash 05/25/2018  . Essential hypertension 05/25/2018  . Educated about COVID-19 virus infection 05/25/2018  . Acute ST elevation myocardial infarction (STEMI) due to occlusion of right coronary artery ([place]) 03/13/2018  . Hx of CABG   . Hx of adenomatous colonic polyps 04/04/2016  . 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 SVT (supraventricular tachycardia) ([place]) 09/03/2012  . ED (erectile dysfunction) 10/05/2010  .  PALPITATIONS 08/29/2008  . BRONCHITIS, ACUTE 06/24/2008  . HYPERLIPIDEMIA 04/14/2008  . Acute ST elevation myocardial infarction (STEMI) ([place]) 04/14/2008  . PULMONARY INFILTRATE INCLUDES (EOSINOPHILIA) 04/14/2008  . Pure hypercholesterolemia 04/14/2008   Past Medical History:  Diagnosis Date  . Atrial fibrillation ([place])   . CAD (coronary artery disease)    CABG 2008; 03/13/2018 NSTEMI due to acute thrombotic occlusion of RCA graft, patent LIMA to LAD.  Marland Kitchen Chronic cough   . GERD (gastroesophageal reflux disease)   . Hx of adenomatous colonic polyps 04/04/2016  . Hyperlipidemia   . Hypertension   . Myocardial infarct ([place])   . Presence of permanent cardiac pacemaker   . SVT (supraventricular tachycardia) ([facility])    Current Outpatient Medications  Medication Sig Dispense Refill  . aspirin 81 MG chewable tablet Chew 1 tablet (81 mg total) by mouth daily. 90 tablet 3  . cholecalciferol (VITAMIN D3) 25 MCG (1000 UNIT) tablet Take 1,000 Units by mouth once a week.    Marland Kitchen HYDROcodone-acetaminophen (HYCET) 7.5-325 mg/15 ml solution Take 15 mLs by mouth every 6 (six) hours as needed for moderate pain. 420 mL 0  . metoprolol succinate (TOPROL-XL) 50 MG 24 hr tablet Take 50 mg by mouth daily. Take with or immediately following a meal.    . omeprazole (PRILOSEC) 40 MG capsule Take 40 mg by mouth daily.    Marland Kitchen OVER THE COUNTER MEDICATION Take 2 tablets by mouth at bedtime as needed (sleep). Relaxium Sleep    . phenol (CHLORASEPTIC) 1.4 % LIQD Use as directed 1 spray in the mouth or throat as needed for throat irritation / pain.    . rosuvastatin (  CRESTOR) 40 MG tablet Take 40 mg by mouth daily.    . sildenafil (VIAGRA) 50 MG tablet Take 1 tablet (50 mg total) by mouth daily as needed for erectile dysfunction. 20 tablet 3   No current facility-administered medications for this visit.   No Known Allergies   VITALS: BP 126/66 (BP Location: Right Arm, Patient Position: Sitting, Cuff Size: Normal)    Pulse 60   Temp 98.4 F (36.9 C) (Oral)    ASSESSMENT/INDICATION(S):  Patient with anticipated radiation therapy to the head and neck region.   PROCEDURES: Delivery of upper and lower scatter protection devices. Appliances were tried in and adjusted as needed.  Polished. Postoperative instructions were provided in a written and verbal format concerning the use and care of appliances.   PLAN/RECOMMENDATIONS: Return after the completion of radiation therapy for a follow-up appointment.  Call if any questions or concerns arise.  All questions and concerns were invited and addressed.  The patient tolerated today's visit well and departed in stable condition.       [place] Benson [place], D.M.D.

## 2021-01-10 NOTE — Progress Notes (Signed)
Has armband been applied?  Yes.    Does patient have an allergy to IV contrast dye?: No.   Has patient ever received premedication for IV contrast dye?: No.   Does patient take metformin?: No.  Date of lab work: January 09, 2021 BUN: 14 CR: 1.13 eGFR: >60  IV site: antecubital left, condition patent and no redness  Has IV site been added to flowsheet?  Yes.    BP (!) 143/73 (BP Location: Left Arm, Patient Position: Sitting, Cuff Size: Large)   Pulse 63   Temp 97.7 F (36.5 C)   Resp 20   Ht 6' (1.829 m)   Wt 190 lb 9.6 oz (86.5 kg)   SpO2 100%   BMI 25.85 kg/m

## 2021-01-11 ENCOUNTER — Telehealth: Payer: Self-pay | Admitting: Student

## 2021-01-11 ENCOUNTER — Encounter: Payer: Self-pay | Admitting: General Practice

## 2021-01-11 ENCOUNTER — Inpatient Hospital Stay: Payer: Medicare (Managed Care) | Attending: Hematology | Admitting: Hematology

## 2021-01-11 VITALS — BP 121/64 | HR 68 | Temp 97.5°F | Resp 20 | Wt 188.9 lb

## 2021-01-11 DIAGNOSIS — Z7189 Other specified counseling: Secondary | ICD-10-CM | POA: Diagnosis not present

## 2021-01-11 DIAGNOSIS — C01 Malignant neoplasm of base of tongue: Secondary | ICD-10-CM | POA: Insufficient documentation

## 2021-01-11 DIAGNOSIS — C770 Secondary and unspecified malignant neoplasm of lymph nodes of head, face and neck: Secondary | ICD-10-CM | POA: Insufficient documentation

## 2021-01-11 NOTE — Patient Instructions (Signed)
Thank you for choosing [facility] to provide your care.   Should you have questions after your visit to the [facility] ([facility]), please contact this office at [phone] between 8:30 AM and 4:30 PM.  Voice mails left after 4:00 PM may not be returned until the following business day.  Calls received after 4:30 PM will be answered by an off-site Nurse Triage Line.    Prescription Refills:  Please have your pharmacy contact us directly for most prescription requests.  Contact the office directly for refills of narcotics (pain medications). Allow 48-72 hours for refills.  Appointments: Please contact the [facility] scheduling department [phone] for questions regarding [facility] appointment scheduling.  Contact the schedulers with any scheduling changes so that your appointment can be rescheduled in a timely manner.   Central Scheduling for [place] [phone] - Call to schedule procedures such as PET scans, CT scans, MRI, Ultrasound, etc.  To afford each patient quality time with our providers, please arrive 30 minutes before your scheduled appointment time.  If you arrive late for your appointment, you may be asked to reschedule.  We strive to give you quality time with our providers, and arriving late affects you and other patients whose appointments are after yours. If you are a no show for multiple scheduled visits, you may be dismissed from the clinic at the providers discretion.     Resources: [facility] Social Workers [phone] for additional information on assistance programs or assistance connecting with community support programs   [place] DSS  [phone]: Information regarding food stamps, Medicaid, and utility assistance [place] Access [place] 336-333-6589   [place] [employer]'s shared-ride transportation service for eligible riders who have a disability that prevents them from riding the fixed route bus.   Medicare Rights Center 800-333-4114  Helps people with Medicare understand their rights and benefits, navigate the Medicare system, and secure the quality healthcare they deserve American [employer] 800-227-2345 Assists patients locate various types of support and financial assistance Cancer Care: 1-800-813-HOPE (4673) Provides financial assistance, online support groups, medication/co-pay assistance.   Transportation Assistance for appointments at [facility]: Transportation Coordinator [phone]  Again, thank you for choosing [facility] for your care.       

## 2021-01-11 NOTE — Progress Notes (Signed)
Steven Mathews Clinical Social Work  Initial Assessment   Steven Mathews is a 75 y.o. year old male contacted by phone. Clinical Social Work was referred by radiation oncology for assessment of psychosocial needs.   SDOH (Social Determinants of Health) assessments performed: Yes   Distress Screen completed: Yes ONCBCN DISTRESS SCREENING 12/27/2020  Screening Type Initial Screening  Distress experienced in past week (1-10) 3  Emotional problem type Adjusting to illness;Adjusting to appearance changes  Information Concerns Type Lack of info about diagnosis;Lack of info about treatment;Lack of info about complementary therapy choices;Lack of info about maintaining fitness  Physical Problem type Pain;Mouth sores/swallowing  Physician notified of physical symptoms Yes  Referral to clinical psychology No  Referral to clinical social work Yes  Referral to dietition Yes  Referral to financial advocate No  Referral to support programs Yes  Referral to palliative care No      Family/Social Information:  Housing Arrangement: patient lives with wife Family members/support persons in your life? Family, Friends/Colleagues, and [employer] concerns: no, wife will transport  Employment: Retired. 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 yes, newly diagnosed w Stage II oropharyngeal cancer, will start radiation next week, will meet w medical oncologist to discuss possibility of chemotherapy as part of his treatment plan.  Diagnosed after period of dysphagia, has had tonsillectomy Concerns about diagnosis and/or treatment: I'm not especially worried about  anything, eating was a difficulty post tonsillectomy (pain) Patient reported stressors:  "I think I know what I am getting into."   Patient enjoys time with family/ friends and stays engaged w building projects and similar activities in his retirement Current coping skills/ strengths: Ability for insight , Active sense of humor , Average or above average intelligence , Capable of independent living , Armed forces logistics/support/administrative officer , Scientist, research (life sciences) , General fund of knowledge , Motivation for treatment/growth , and Supportive family/friends     SUMMARY: Current SDOH Barriers:  none  Interventions: Discussed common feeling and emotions when being diagnosed with cancer, and the importance of support during treatment Informed patient of the support team roles and support services at [facility] Provided CSW contact information and encouraged patient to call with any questions or concerns Provided patient with information about Society for People w Oral Head and Neck Cancers, will also meet him during upcoming [place] , discussed [place] and availability of resources at [place]: Patient will contact [place] with any support or resource needs Patient verbalizes understanding of plan: Yes    Steven Mathews , [place], [place] Worker Phone:  [phone]

## 2021-01-11 NOTE — Telephone Encounter (Signed)
Called and left voicemail regarding EBUS results. It was negative and I think we got a pretty decent sample but I've sent message to couple of my colleagues to see if they think it would be worth repeating EBUS. I'll certainly reach out once I've had an opportunity to speak with them but in meantime I'm happy to address any questions you have by phone [phone]) or mychart.

## 2021-01-11 NOTE — Progress Notes (Addendum)
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  Patient has a history of coronary artery disease status post CABG in 2008 with non-STEMI in February 2020, GERD, hypertension, dyslipidemia, history of supraventricular tachycardias, permanent cardiac pacemaker, atrial fibrillation who presented with initially presented with dysphagia progressively for several months with a sensation of food getting stuck during the swallowing process.  Patient had an MRI of the neck on 09/26/2020 which showed a cystic-appearing mass in the right neck located posterior to the right submandibular gland measuring 3.5 x 3.5 x 4.7 cm.  Patient was referred to Dr. Constance Holster on 10/02/2020 and he had an FNA of the mass which showed squamous cell carcinoma.  GI work-up for dysphagia including EGD and biopsies on 11/27/2020 showed evidence of reflux esophagitis with no evidence of malignancy.  Patient subsequently had a PET CT scan on 11/29/2020 in light of metastatic squamous cell carcinoma to right sided cervical lymph nodes concerning for head and neck squamous cell carcinoma. This showed "Findings suspicious for neoplasm of the head and neck with RIGHT neck adenopathy. Asymmetric uptake at the level of the oropharyngeal tonsillar tissue in base of tongue greatest on the LEFT, correlation with direct visualization or  cross-sectional imaging with intravenous contrast may be helpful.   Moderate increased metabolic activity associated with what is likely a lymph node at the RIGHT hilum. Nonspecific in a patient with evidence of prior granulomatous disease but suspicious given other findings."  12/11/2020 the patient underwent surgical resection by Dr. Esmond Harps, bilateral base of tongue biopsies and right-sided neck dissection.  This revealed 1/14 nodes positive for squamous cell carcinoma (p16 positive measuring 3.5x 3.5 x 4.7 cm in size, no extracapsular extension noted) Left tongue base biopsy showed nonkeratinizing squamous cell carcinoma (positive margins noted) Right tongue base biopsy negative for carcinoma Left and right tonsils excision negative for carcinoma.  Patient tolerated and recovered well from surgery. 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  In light of his indeterminate FDG avid positive mediastinal lymph node patient had a CT of the chest on 12/12/2020 which showed nonspecific enlarged right hilar lymph node. Patient was seen by pulmonary Dr. Valeta Harms and and had a bronchoscopic biopsy on 01/08/2021 which showed no evidence of malignancy in the right hilar lymph node.  He has met with Dr Kandace Parkins ENT at [facility] and with Dr. Eppie Gibson from radiation oncology at [facility].  He had an long discussion regarding all his imaging and pathology results to date and NCCN guidelines regarding recommendations for treatment.  Other acute concerns at this time. MEDICAL HISTORY:  Past Medical History:  Diagnosis Date   Atrial fibrillation ([place])    CAD (coronary artery disease)    CABG 2008; 03/13/2018 NSTEMI due to acute thrombotic occlusion of RCA graft, patent LIMA to LAD.   Chronic cough    GERD (gastroesophageal reflux  disease)    Hx of adenomatous colonic polyps 04/04/2016   Hyperlipidemia     Hypertension    Myocardial infarct ([place])    Presence of permanent cardiac pacemaker    SVT (supraventricular tachycardia) ([place])     SURGICAL HISTORY: Past Surgical History:  Procedure Laterality Date   CORONARY ARTERY BYPASS GRAFT  03/2006   DIRECT LARYNGOSCOPY N/A 12/11/2020   Procedure: DIRECT LARYNGOSCOPY WITH BIOPSY;  Surgeon: Izora Gala, MD;  Location: [place];  Service: ENT;  Laterality: N/A;   ENDOBRONCHIAL ULTRASOUND N/A 01/08/2021   Procedure: ENDOBRONCHIAL ULTRASOUND;  Surgeon: [name], MD;  Location: [facility];  Service: Pulmonary;  Laterality: N/A;   ESOPHAGOSCOPY N/A 12/11/2020   Procedure: ESOPHAGOSCOPY;  Surgeon: Izora Gala, MD;  Location: [place];  Service: ENT;  Laterality: N/A;   FINE NEEDLE ASPIRATION  01/08/2021   Procedure: FINE NEEDLE ASPIRATION;  Surgeon: [name], MD;  Location: [facility];  Service: Pulmonary;;   [place] CATH AND CORS/GRAFTS ANGIOGRAPHY N/A 03/13/2018   Procedure: LEFT HEART CATH AND CORS/GRAFTS ANGIOGRAPHY;  Surgeon: Troy Sine, MD;  Location: [place] [facility];  Service: Cardiovascular;  Laterality: N/A;   PACEMAKER IMPLANT N/A 06/26/2020   Procedure: PACEMAKER IMPLANT;  Surgeon: Evans Lance, MD;  Location: [place] [facility];  Service: Cardiovascular;  Laterality: N/A;   RADICAL NECK DISSECTION Right 12/11/2020   Procedure: RIGHT MODIFIED NECK DISSECTION;  Surgeon: Izora Gala, MD;  Location: [place];  Service: ENT;  Laterality: Right;   SUPRAVENTRICULAR TACHYCARDIA ABLATION N/A 09/03/2012   Procedure: SUPRAVENTRICULAR TACHYCARDIA ABLATION;  Surgeon: Evans Lance, MD;  Location[facility];  Service: Cardiovascular;  Laterality: N/A;   TONSILLECTOMY Bilateral 12/11/2020   Procedure: BILATERAL TONSILLECTOMY;  Surgeon: Izora Gala, MD;  Location: [place];  Service: ENT;  Laterality: Bilateral;   VIDEO BRONCHOSCOPY  01/08/2021   Procedure: VIDEO BRONCHOSCOPY WITHOUT FLUORO;  Surgeon: [name], MD;  Location: [facility];  Service: Pulmonary;;    SOCIAL HISTORY: Social History   Socioeconomic History   Marital status: Married    Spouse name: Not on file   Number of children: Not on file   Years of education: Not on file   Highest education level: Not on file  Occupational History   Not on file  Tobacco Use   Smoking status: Never   Smokeless tobacco: Never  Vaping Use   Vaping Use: Never used  Substance and Sexual Activity   Alcohol use: Yes    Comment: 2-3 glasses wine/day   Drug use: No   Sexual activity: Not on file  Other Topics Concern   Not on file  Social History Narrative   Right handed   Two story home   Drinks caffeine   Social Determinants of Health   Financial Resource Strain: Not on file  Food Insecurity: No Food Insecurity   Worried About Running Out of Food in the Last Year: Never true   Ran Out of Food in the Last Year: Never true  Transportation Needs: No Transportation Needs   Lack of Transportation (Medical): No   Lack of Transportation (Non-Medical): No  Physical Activity: Not on file  Stress: No Stress Concern Present   Feeling of Stress : Not at all  Social Connections: Unknown   Frequency of Communication with Friends and Family: More than three times a week   Frequency of Social Gatherings with Friends and Family: More than three  times a week   Attends Religious Services: More than 4 times per year   Active Member of Clubs or Organizations: Not on file   Attends Archivist Meetings: Not on file   Marital Status: Married  Human resources officer Violence: Not on file    FAMILY HISTORY: Family History  Problem Relation Age of Onset   Liver cancer Father    Cancer Mother        bladder cancer   Colon cancer Brother 16    ALLERGIES:  has No Known Allergies.  MEDICATIONS:  Current Outpatient Medications  Medication Sig Dispense Refill   aspirin 81 MG chewable tablet Chew 1 tablet (81 mg total) by mouth  daily. 90 tablet 3   cholecalciferol (VITAMIN D3) 25 MCG (1000 UNIT) tablet Take 1,000 Units by mouth once a week.     HYDROcodone-acetaminophen (HYCET) 7.5-325 mg/15 ml solution Take 15 mLs by mouth every 6 (six) hours as needed for moderate pain. 420 mL 0   metoprolol succinate (TOPROL-XL) 50 MG 24 hr tablet Take 50 mg by mouth daily. Take with or immediately following a meal.     omeprazole (PRILOSEC) 40 MG capsule Take 40 mg by mouth daily.     OVER THE COUNTER MEDICATION Take 2 tablets by mouth at bedtime as needed (sleep). Relaxium Sleep     phenol (CHLORASEPTIC) 1.4 % LIQD Use as directed 1 spray in the mouth or throat as needed for throat irritation / pain.     rosuvastatin (CRESTOR) 40 MG tablet Take 40 mg by mouth daily.     sildenafil (VIAGRA) 50 MG tablet Take 1 tablet (50 mg total) by mouth daily as needed for erectile dysfunction. 20 tablet 3   No current facility-administered medications for this visit.    REVIEW OF SYSTEMS:    10 Point review of Systems was done is negative except as noted above.  PHYSICAL EXAMINATION: ECOG PERFORMANCE STATUS: 2  . Vitals:   01/11/21 1318  BP: 121/64  Pulse: 68  Resp: 20  Temp: (!) 97.5 F (36.4 C)  SpO2: 99%   Filed Weights   01/11/21 1318  Weight: 188 lb 14.4 oz (85.7 kg)   .Body mass index is 25.62 kg/m.  GENERAL:alert, in no acute distress and comfortable SKIN: no acute rashes, no significant lesions EYES: conjunctiva are pink and non-injected, sclera anicteric OROPHARYNX: MMM, no exudates, no oropharyngeal erythema or ulceration NECK: supple, no JVD LYMPH:  no palpable lymphadenopathy in the cervical, axillary or inguinal regions.  Healed right-sided surgical incisions. LUNGS: clear to auscultation b/l with normal respiratory effort HEART: regular rate & rhythm ABDOMEN:  normoactive bowel sounds , non tender, not distended. Extremity: no pedal edema PSYCH: alert & oriented x 3 with fluent speech NEURO: no focal  motor/sensory deficits  LABORATORY DATA:  I have reviewed the data as listed  . CBC Latest Ref Rng & Units 12/07/2020 06/13/2020 04/25/2020  WBC 4.0 - 10.5 K/uL 7.0 6.8 6.5  Hemoglobin 13.0 - 17.0 g/dL 13.4 15.1 13.8  Hematocrit 39.0 - 52.0 % 41.2 43.0 40.4  Platelets 150 - 400 K/uL 219 264 248    . CMP Latest Ref Rng & Units 01/09/2021 12/07/2020 06/13/2020  Glucose 70 - 99 mg/dL - 116(H) 99  BUN 8 - 23 mg/dL 14 25(H) 16  Creatinine 0.61 - 1.24 mg/dL 1.13 1.24 1.12  Sodium 135 - 145 mmol/L - 140 140  Potassium 3.5 - 5.1 mmol/L - 4.9 5.0  Chloride 98 - 111  mmol/L - 107 103  CO2 22 - 32 mmol/L - 27 24  Calcium 8.9 - 10.3 mg/dL - 9.5 10.0  Total Protein 6.0 - 8.5 g/dL - - -  Total Bilirubin 0.0 - 1.2 mg/dL - - -  Alkaline Phos 44 - 121 IU/L - - -  AST 0 - 40 IU/L - - -  ALT 0 - 44 IU/L - - -   SURGICAL PATHOLOGY   THIS IS AN ADDENDUM REPORT   CASE: [id number]  PATIENT: [name]  Surgical Pathology Report  Addendum    Reason for Addendum #1:  Additional clinical/test information   Clinical History: Neck mass, Esophageal dysphagia (nt)     FINAL MICROSCOPIC DIAGNOSIS:   A. TONSIL, RIGHT, EXCISION:  -  Benign tonsil  -  No carcinoma identified  -  See comment   B. TONGUE, RIGHT BASE, BIOPSY:  -  Benign lingual tonsil  -  No carcinoma identified  -  See comment   C. TONSIL, LEFT, EXCISION:  -  Benign tonsil  -  No carcinoma identified  -  See comment   D. TONGUE, RIGHT BASE, BIOPSY:  -  No carcinoma identified  -  See comment   E. TONGUE, LEFT BASE, BIOPSY:  -  Squamous cell carcinoma, non-keratinizing  -  See comment   F. LYMPH NODE, RIGHT NECK, DISSECTION:  -  Metastatic squamous cell carcinoma involving one of fourteen lymph  nodes (1/14)  -  See comment   COMMENT:   A-F.  Dr. Nita Sickle reviewed the case and agrees with the presence of  malignancy.   E.  A small focus of invasive squamous cell carcinoma is present on the  first level of the  biopsy.  P16 immunohistochemistry was performed;  however, the focus of squamous cell carcinoma has been cut away.   F.  P16 immunohistochemistry is positive. EBV by in-situ hybridization  is negative.   RADIOGRAPHIC STUDIES: I have personally reviewed the radiological images as listed and agreed with the findings in the report. CT CHEST W CONTRAST  Result Date: 12/12/2020 CLINICAL DATA:  Hilar mass seen on PET-CT scan. History of oropharyngeal cancer. EXAM: CT CHEST WITH CONTRAST TECHNIQUE: Multidetector CT imaging of the chest was performed during intravenous contrast administration. CONTRAST:  18mL OMNIPAQUE IOHEXOL 350 MG/ML SOLN COMPARISON:  November 29, 2020 FINDINGS: Cardiovascular: The heart is normal in size and there is no pericardial effusion. Coronary artery calcifications are noted and metallic leads are present in the heart. There is atherosclerotic calcification of the aorta without evidence of aneurysm. The pulmonary trunk is normal in caliber Mediastinum/Nodes: Shotty lymph nodes are present in the mediastinum. There is a prominent lymph node at the right hilum measuring 1.4 cm in short axis diameter. Calcified lymph nodes are noted at the left hilum and mediastinum. The thyroid gland, trachea, and esophagus are within normal limits. There is a small hiatal hernia. Lungs/Pleura: Mild atelectasis or scarring is noted at the lung bases bilaterally and subsegmental atelectasis is present in the left lower lobe. There is mild apical pleural scarring bilaterally. No pulmonary nodule or mass is seen. No effusion or pneumothorax. Calcified granuloma is present in the left upper lobe Upper Abdomen: No acute abnormality. A small hiatal hernia is noted. Musculoskeletal: Surgical stables and a few foci of air are noted in the soft tissues in the cervical region to the right of midline. Sternotomy wires are present over the midline. Degenerative changes are present in the thoracic spine.  No suspicious  osseous abnormality is identified. IMPRESSION: 1. Nonspecific enlarged right hilar lymph node which corresponds to lymph node seen on recent PET CT. Findings may be infectious, inflammatory, or neoplastic. 2. No pulmonary nodule or mass. 3. Surgical changes and small foci of free air in the cervical soft tissues to the right of midline. 4. Small hiatal hernia. 5. Coronary artery calcifications. Electronically Signed   By: Brett Fairy M.D.   On: 12/12/2020 21:11   CUP PACEART INCLINIC DEVICE CHECK  Result Date: 12/22/2020 Pacemaker check in clinic. Normal device function. Thresholds, sensing, impedances consistent with previous measurements. Device programmed to maximize longevity. No mode switch or high ventricular rates noted. Device programmed at appropriate safety margins. Histogram distribution appropriate for patient activity level. Device programmed to optimize intrinsic conduction. Estimated longevity 11.3 years. Carelink 12/26/21. ROV with GT 12 monthsElizabeth Watts BSN,RN,CCDS  CUP PACEART REMOTE DEVICE CHECK  Result Date: 12/30/2020 Scheduled remote reviewed. Normal device function.  1 VHR episode, duration < 2 seconds.  Appears that V precedes atrial activation at times, but morphology similar to other VS events. Next remote 91 days.   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 However,  there is slight thickening of the wall in multiple areas and a cystic metastasis could have a similar appearance. No visible primary lesion identified. Recommend ENT referral for further evaluation. Narrative  MRI NECK SOFT TISSUE WITH AND WITHOUT CONTRAST, 09/26/2020 3:03 PM    PET CT scan 11/29/2020: IMPRESSION: Findings suspicious for neoplasm of the head  and neck with RIGHT neck adenopathy. Asymmetric uptake at the level of the oropharyngeal tonsillar tissue in base of tongue greatest on the LEFT, correlation with direct visualization or cross-sectional imaging with intravenous contrast may be helpful.   Moderate increased metabolic activity associated with what is likely a lymph node at the RIGHT hilum. Nonspecific in a patient with evidence of prior granulomatous disease but suspicious given other findings.   Fatty area within posterior LEFT thigh amidst musculature tracking toward hamstring insertion. Perhaps this represents sequela of prior trauma with asymmetric atrophy. Given boundaries with other, adjacent musculature atypical lipomatous lesion is not entirely excluded, consider correlation with MRI if there is no prior imaging for comparison.  Aortic Atherosclerosis (ICD10-I70.0).  Electronically Signed   By: Zetta Bills M.D.   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 Presented as a right sided metastatic cervical lymph node. Clinically no obvious left-sided cervical adenopathy noted. Right hilar lymphadenopathy biopsy done showed no evidence of malignancy. PLAN -I discussed the available imaging studies in detail with the patient including his MRI of the neck, PET/CT scan, CT of the chest. -We discussed all the pathology results from his right-sided neck dissection tonsillectomy and bilateral base of tongue biopsies. -We discussed cytology from his bronchoscopy biopsy of the right hilar lymph node. 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   This was only biopsied and the biopsy showed positive margins. He has had completion of right-sided lymph node dissection and tolerated surgery well and healing well.  No issues with cardiac complications. We discussed that he would have a couple of options #1 ideally if his left base of tongue tumor is reasonably defined on imaging and it is technically possible to get resection of his primary left tongue base with negative margins and complete a left-sided neck dissection then he could proceed to adjuvant radiation alone and this would be the most complete treatment.  #2 if the left base of tongue primary tumor cannot technically be reasonably resected without significant disfigurement or loss of function then technically there will arise a consideration to do concurrent chemoradiation considering remaining primary tumor with positive margins.  We discussed however that with his age and medical comorbidities he is not the best candidate for concurrent chemoradiation and he also is not inclined to consider chemotherapy along with radiation. In this setting, where the primary tumor is potentially limited in size, adjuvant radiation alone could potentially provide a curative option by itself.  We had significant goals of care discussion and extensive shared decision making patient was very thankful to have a detailed understanding of the situation. He notes that he is leaning more towards considering adjuvant radiation therapy alone but wants to talk to his family and give it more thought.  At this point we used a conclusion to not consider concurrent chemotherapy at this time.    All of the patients questions were answered with apparent satisfaction. The patient knows to call the clinic with any problems, questions or concerns.  I spent  50 mins counseling the patient face to face. The total time spent in the appointment was 70 mins and more than 50% was on counseling and direct patient cares.     Sullivan Lone MD Thor AAHIVMS [facility] Hematology/Oncology Physician [facility]  (Office):       [phone] (Work cell):  [phone] (Fax):           [phone]  01/11/2021 1:11 PM

## 2021-01-12 ENCOUNTER — Telehealth: Payer: Self-pay | Admitting: Radiation Oncology

## 2021-01-12 NOTE — Telephone Encounter (Signed)
Scheduled per sch msg. Called and spoke with patient. Confirmed appt  

## 2021-01-16 ENCOUNTER — Other Ambulatory Visit: Payer: Self-pay

## 2021-01-16 ENCOUNTER — Inpatient Hospital Stay (HOSPITAL_BASED_OUTPATIENT_CLINIC_OR_DEPARTMENT_OTHER): Payer: Medicare (Managed Care) | Admitting: Dietician

## 2021-01-16 NOTE — Progress Notes (Signed)
Nutrition Assessment   Reason for Assessment: New Head and Neck   ASSESSMENT: 75 year old male with newly diagnosed malignant base of tongue cancer. He underwent EBUS bronchoscopy for right hilar lymphadenopathy on 12/5, no malignant cells identified per pathology. Patient is scheduled for first radiation therapy on 12/19. Patient is followed by Dr. Isidore Moos  Past medical history includes HTN, HLD, CAD, STEMI, s/p CABG (2008), SVT, Heart block pacemaker, GERD  Met with patient and wife in office. Patient reports mild sore throat as well as decreased intake s/p recent bronchoscopy. Patient reports typically eating late breakfast or lunch and substantial dinner meal, occasional snack. He does not drink much water. Yesterday he ate french toast, bacon with coffee for brunch. Wife enjoys cooking, she prepared chicken casserole for dinner, however he ate bowl of chicken noodle soup because this was easier for him to get down. Patient drank a cup of coffee, bourbon/gingerale, and ~half bottle of water.   Nutrition Focused Physical Exam: deferred   Medications: D3, Hycet, Prilosec, Crestor, Viagra, Toprol-XL  Labs: 11/3 labs reviewed and include - Glucose 116, BUN 25   Anthropometrics: Weights have decreased 4% in the last month. Patient weighed 197 lb on 11/7 - insignificant   Height: 6' Weight: 188.9 lb (01/11/21) UBW: 202 lb 9.6 oz (04/12/20) BMI: 25.62   NUTRITION DIAGNOSIS: Predicted suboptimal intake related to metastatic base of tongue cancer and associated treatment side effects as evidenced by pending radiation therapy affecting ability to eat   INTERVENTION:  Educated on importance of adequate nutrition to maintain weights, strength, nutrition Educated on ways to alter texture of foods, encouraged soft moist high protein foods - handout for sore mouth/throat as well has high protein foods provided Encouraged small frequent meals and snacks with adequate calories and protein - shake  recipes given Suggested pt drink oral nutrition supplement for added calories and protein(samples of Ensure Complete and Dessie Coma provided for pt to try) Patient will work to increase intake of water, recommend 64 ounces/day Patient given contact information    MONITORING, EVALUATION, GOAL: Patient will tolerate increased calories and protein to minimize weight loss    Next Visit: Friday December 23 (card with appointment time/date provided)

## 2021-01-18 ENCOUNTER — Encounter: Payer: Self-pay | Admitting: General Practice

## 2021-01-18 ENCOUNTER — Other Ambulatory Visit: Payer: Self-pay

## 2021-01-18 ENCOUNTER — Encounter: Payer: Self-pay | Admitting: Physical Therapy

## 2021-01-18 ENCOUNTER — Ambulatory Visit: Payer: Medicare (Managed Care)

## 2021-01-18 ENCOUNTER — Ambulatory Visit: Payer: Medicare (Managed Care) | Attending: Radiation Oncology | Admitting: Physical Therapy

## 2021-01-18 DIAGNOSIS — R293 Abnormal posture: Secondary | ICD-10-CM | POA: Diagnosis not present

## 2021-01-18 DIAGNOSIS — R1310 Dysphagia, unspecified: Secondary | ICD-10-CM | POA: Diagnosis present

## 2021-01-18 DIAGNOSIS — C01 Malignant neoplasm of base of tongue: Secondary | ICD-10-CM | POA: Insufficient documentation

## 2021-01-18 NOTE — Therapy (Signed)
[place] @ [facility] [place], [place], [address] Phone: [phone]   Fax:  [phone]  Physical Therapy Evaluation  Patient Details  Name: Steven Mathews MRN: [id number] Date of Birth: 02/02/1946 Referring Provider (PT): Reita May Date: 01/18/2021   PT End of Session - 01/18/21 0837     Visit Number 1    Number of Visits 2    Date for PT Re-Evaluation 03/29/21    PT Start Time 0842    PT Stop Time 0920    PT Time Calculation (min) 38 min    Activity Tolerance Patient tolerated treatment well    Behavior During Therapy [facility] for tasks assessed/performed             Past Medical History:  Diagnosis Date   Atrial fibrillation ([place])    CAD (coronary artery disease)    CABG 2008; 03/13/2018 NSTEMI due to acute thrombotic occlusion of RCA graft, patent LIMA to LAD.   Chronic cough    GERD (gastroesophageal reflux disease)    Hx of adenomatous colonic polyps 04/04/2016   Hyperlipidemia    Hypertension    Myocardial infarct [facility])    Presence of permanent cardiac pacemaker    SVT (supraventricular tachycardia) ([place])     Past Surgical History:  Procedure Laterality Date   CORONARY ARTERY BYPASS GRAFT  03/2006   DIRECT LARYNGOSCOPY N/A 12/11/2020   Procedure: DIRECT LARYNGOSCOPY WITH BIOPSY;  Surgeon: Izora Gala, MD;  Location: [place];  Service: ENT;  Laterality: N/A;   ENDOBRONCHIAL ULTRASOUND N/A 01/08/2021   Procedure: ENDOBRONCHIAL ULTRASOUND;  Surgeon: [name], MD;  [facility];  Service: Pulmonary;  Laterality: N/A;   ESOPHAGOSCOPY N/A 12/11/2020   Procedure: ESOPHAGOSCOPY;  Surgeon: Izora Gala, MD;  Location: [place];  Service: ENT;  Laterality: N/A;   FINE NEEDLE ASPIRATION  01/08/2021   Procedure: FINE NEEDLE ASPIRATION;  Surgeon: [name], MD;  [facility];  Service: Pulmonary;;   [place] CATH AND CORS/GRAFTS ANGIOGRAPHY N/A  03/13/2018   Procedure: LEFT HEART CATH AND CORS/GRAFTS ANGIOGRAPHY;  Surgeon: Troy Sine, MD;  Location: [place] CV LAB;  Service: Cardiovascular;  Laterality: N/A;   PACEMAKER IMPLANT N/A 06/26/2020   Procedure: PACEMAKER IMPLANT;  Surgeon: Evans Lance, MD;  Location: [place] CV LAB;  Service: Cardiovascular;  Laterality: N/A;   RADICAL NECK DISSECTION Right 12/11/2020   Procedure: RIGHT MODIFIED NECK DISSECTION;  Surgeon: Izora Gala, MD;  Location: [place];  Service: ENT;  Laterality: Right;   SUPRAVENTRICULAR TACHYCARDIA ABLATION N/A 09/03/2012   Procedure: SUPRAVENTRICULAR TACHYCARDIA ABLATION;  Surgeon: Evans Lance, MD;  Location: [facility] CATH LAB;  Service: Cardiovascular;  Laterality: N/A;   TONSILLECTOMY Bilateral 12/11/2020   Procedure: BILATERAL TONSILLECTOMY;  Surgeon: Izora Gala, MD;  Location: [place];  Service: ENT;  Laterality: Bilateral;   VIDEO BRONCHOSCOPY  01/08/2021   Procedure: VIDEO BRONCHOSCOPY WITHOUT FLUORO;  Surgeon: [name], MD;  [facility];  Service: Pulmonary;;    There were no vitals filed for this visit.    Subjective Assessment - 01/18/21 0835     Subjective I start radiation on the Dec 19th.    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 Constance Holster in consult and he performed a FNA of a right neck mass revealing SCC, 11/29/20 PET  revealed findings suspicious for neoplasm of the head and neck with right neck adenopathy. Asymmetric uptake at the level of the oropharyngeal tonsillar tissue in base of tongue was appreciated as well, noted to be greatest on the left. Moderate increased metabolic activity was also seen associated with a (presumed) lymph node at the right hilum, 12/11/20 Dr. Constance Holster completed multiple biopsies and excisions. 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 Left tongue base biopsy: non-keratinizing SCC, right tongue base, negative for carcinoma, left and right tonsil excisions, negative for carcinoma., 01/08/21 Biopsy of right hilar lymphadenopathy- negative for carcinoma, will receive 35 fractions of radiation to his Base of tongue and bilateral neck. He will start on 12/19 and complete 03/13/21    Patient Stated Goals to gain info from providers    Currently in Pain? Yes    Pain Score 2     Pain Location Throat    Pain Orientation Anterior    Pain Descriptors / Indicators Dull    Pain Type Acute pain    Pain Onset More than a month ago    Pain Frequency Constant    Aggravating Factors  swallowing    Pain Relieving Factors chloroseptic spray    Effect of Pain on Daily Activities more difficult to eat                [facility] PT Assessment - 01/18/21 0001       Assessment   Medical Diagnosis base of tongue cancer with mets to cervical nodes    Referring Provider (PT) Isidore Moos    Onset Date/Surgical Date 12/11/20    Hand Dominance Right    Prior Therapy none      Precautions   Precautions Other (comment);ICD/Pacemaker    Precaution Comments active cancer/ pacemaker      Restrictions   Weight Bearing Restrictions No      Balance Screen   Has the patient fallen in the past 6 months No    Has the patient had a decrease in activity level because of a fear of falling?  No   pt reports he does not feel as stable as he used to   Is the patient reluctant to leave their home because of a fear of falling?  No      Home Ecologist residence    Living Arrangements Spouse/significant other    Available Help at Discharge Family    Type of [place]      Prior Function   Level of [place] Part time employment    Probation officer, historical development    Leisure pt walks dogs twice a day for about a mile to  a mile and a half, pt had been playing golf until the weather got cold      Cognition   Overall Cognitive Status Within Functional Limits for tasks assessed      Observation/Other Assessments   Observations healing scar from neck dissection, some slight swelling in R neck that could be  post surgical      Functional Tests   Functional tests Sit to Stand      Sit to Stand   Comments 30 sec sit to stand: 14 reps- 13 is average for his age      Posture/Postural Control   Posture/Postural Control Postural limitations    Postural Limitations Rounded Shoulders;Forward head  ROM / Strength   AROM / PROM / Strength AROM      AROM   AROM Assessment Site Cervical    Cervical Flexion WFL    Cervical Extension WFL    Cervical - Right Side Bend 50% limited    Cervical - Left Side Bend WFL    Cervical - Right Rotation WFL    Cervical - Left Rotation [facility]      Ambulation/Gait   Ambulation/Gait Yes    Ambulation/Gait Assistance 7: Independent    Ambulation Distance (Feet) 10 Feet    Gait Pattern Narrow base of support    Gait Comments pt reports he feels his narrow base of support secondary to an injury that happened 5 months ago when he was waterskiing and injured his hamstrings               LYMPHEDEMA/ONCOLOGY QUESTIONNAIRE - 01/18/21 0001       Lymphedema Assessments   Lymphedema Assessments Head and Neck      Head and Neck   4 cm superior to sternal notch around neck 38.7 cm    6 cm superior to sternal notch around neck 39.1 cm    8 cm superior to sternal notch around neck 40.4 cm                     Objective measurements completed on examination: See above findings.                PT Education - 01/18/21 0836     Education Details Neck ROM, importance of posture when sitting, standing and lying down, deep breathing, walking program and importance of staying active throughout treatment, CURE article on staying active, "Why exercise?"  flyer, lymphedema and PT info    Person(s) Educated Patient    Methods Explanation;Handout    Comprehension Verbalized understanding                 PT Long Term Goals - 01/18/21 0839       PT LONG TERM GOAL #1   Title Pt will return to baseline cervical ROM measurements and not demonstrate any signs or symptoms of lymphedema.    Time 10    Period Weeks    Status New    Target Date 03/29/21                Head and Neck Clinic Goals - 01/18/21 9323       Patient will be able to verbalize understanding of a home exercise program for cervical range of motion, posture, and walking.          Time 1    Period Days    Status Achieved      Patient will be able to verbalize understanding of proper sitting and standing posture.          Time 1    Period Days    Status Achieved      Patient will be able to verbalize understanding of lymphedema risk and availability of treatment for this condition.          Time 1    Period Days    Status Achieved                Plan - 01/18/21 0836     Clinical Impression Statement Pt with recently diagnosed base of tongue cancer with mets to cervical lymph nodes. He has a healing scar with some thickening of scar tissue. Educated  pt about scar mobilization. Pt reports he feels a little more unsteady than he used to following a recent injury over the summer while waterskiing. Pt would benefit from some balance exercises after he completes his radiation. He has some very light edema in right side of neck that could still be some post surgical edema. Educated pt about signs and symptoms of lymphedema as well as anatomy and physiology of lymphatic system. Educated pt in importance of staying as active as possible throughout treatment to decrease fatigue as well as head and neck ROM exercises to decrease loss of ROM. Will see pt after completion of radiation to reassess ROM and assess for lymphedema to determine therapy needs at that  time.    Stability/Clinical Decision Making Stable/Uncomplicated    Clinical Decision Making Low    Rehab Potential Good    PT Frequency --   eval and 1 f/u visit   PT Duration --   10 weeks   PT Treatment/Interventions ADLs/Self Care Home Management;Patient/family education;Therapeutic exercise    PT Next Visit Plan reassess baselines, does pt want to do high level balance ex to improve stability?    PT Home Exercise Plan head and neck ROM exercises    Consulted and Agree with Plan of Care Patient             Patient will benefit from skilled therapeutic intervention in order to improve the following deficits and impairments:  Postural dysfunction, Decreased knowledge of precautions  Visit Diagnosis: Abnormal posture  Malignant neoplasm of base of tongue ([place])     Problem List 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   Malignant neoplasm of base of tongue ([place]) 12/27/2020   Pacemaker 12/19/2020   Neck mass 12/11/2020   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[place]) 12/11/2020   Bradycardia 08/01/2020   Heart block 06/13/2020   Therapeutic drug monitoring 09/16/2018   Rash 05/25/2018   Essential hypertension 05/25/2018   Educated about COVID-19 virus infection 05/25/2018   Acute ST elevation myocardial infarction (STEMI) due to occlusion of right coronary artery ([place]) 03/13/2018   Hx of CABG    Hx of adenomatous colonic polyps 04/04/2016   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

## 2021-01-18 NOTE — Progress Notes (Signed)
[place] CSW Progress Notes  Brief check in visit w patient at [facility] Multidisciplinary clinic.  No issues/concerns raised.  Provided information packet on [place] and encouraged him to call us as needed.  Edwyna Shell, LCSW Clinical Social Worker Phone:  [phone]

## 2021-01-18 NOTE — Progress Notes (Signed)
Oncology Nurse Navigator Documentation   I met with Steven Mathews before his appointments with PT and SLP in [place] this morning. He reports that he is feeling well and denies concerns at this time. He is ready to start treatment on 12/19. He knows to call me if he has any questions or concerns.   Harlow Asa RN, BSN, OCN Head & Neck Oncology Nurse [place] at [facility] Phone # [phone]  Fax # [phone]

## 2021-01-18 NOTE — Patient Instructions (Signed)
SWALLOWING EXERCISES Do these until 6 months after your last day of radiation, then 2-3 times per week afterwards  Effortful Swallows - Press your tongue against the roof of your mouth for 3 seconds, then squeeze the muscles in your neck while you swallow your saliva or a sip of water - Repeat 10-15 times, 2-3 times a day, and use whenever you eat or drink  [place] Swallow - swallow with your tongue sticking out - Stick tongue out past your lips and gently bite tongue with your teeth - Swallow, while holding your tongue with your teeth - Repeat 10-15 times, 2-3 times a day *use a wet spoon if your mouth gets dry*  Pitch Raise - Repeat he, once per second in as high of a pitch as you can - Repeat 20 times, 2-3 times a day  Shaker Exercise - head lift - Lie flat on your back in your bed or on a couch without pillows - Raise your head and look at your feet - KEEP YOUR SHOULDERS DOWN - HOLD FOR 45-60 SECONDS, then lower your head back down - Repeat 3 times, 2-3 times a day  [employer] - half swallow exercise - Start to swallow, and keep your Adams apple up by squeezing hard with the muscles of the throat - Hold the squeeze for 5-7 seconds and then relax - Repeat 10-15 times, 2-3 times a day *use a wet spoon if your mouth gets dry*

## 2021-01-18 NOTE — Progress Notes (Signed)
Cardiology Office Note   Date:  01/19/2021   ID:  Steven Mathews, DOB 03-13-1945, MRN [id number]  PCP:  Cathlean Sauer, MD  Cardiologist:   Minus Breeding, MD   Chief Complaint  Patient presents with   Palpitations       History of Present Illness: Steven Mathews is a 75 y.o. male with CAD s/p CABG.  He presented to the hospital on 03/13/2018 with inferior STEMI.  Cardiac catheterization performed on 03/13/2018 showed 40% ostial to proximal LAD lesion, 30% proximal LAD lesion, 55% proximal left circumflex lesion, 100% occlusion of SVG to distal RCA with extensive thrombus.  He had a patent LIMA to mid LAD, chronically occluded SVG to obtuse marginal branch.  EF 40 to 45%.  Medical therapy was recommended.  Echocardiogram obtained on 03/15/2018 showed EF 60 to 65%, moderately reduced RVEF, mild dilatation of the aortic root.  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  Beta-blocker was initially held, however later was reintroduced at a low dose.   However he had symptomatic bradycardia with Mobitz Type II and had dual chamber pacemaker.  At the last visit he had palpitations and I increased his beta-blocker.  Since then he has been found to have squamous cell cancer of the tongue and tonsils.  He had resection.  He is going to start radiation soon.  Despite this he is very active.  He is not golfing but he still walks the dog.  He does working on his buildings.  He has not had any new palpitations, presyncope or syncope.  He has not had any new shortness of breath, PND or orthopnea.  He has had no weight gain and in fact has lost weight.  He is medically nutritionist.   Past Medical History:  Diagnosis Date   Atrial fibrillation ([place])    CAD (coronary artery disease)    CABG 2008; 03/13/2018 NSTEMI due to acute thrombotic occlusion of RCA graft, patent LIMA to LAD.   Chronic cough    GERD (gastroesophageal reflux disease)     Hx of adenomatous colonic polyps 04/04/2016   Hyperlipidemia    Hypertension    Myocardial infarct [facility])    Presence of permanent cardiac pacemaker    SVT (supraventricular tachycardia) ([place])     Past Surgical History:  Procedure Laterality Date   CORONARY ARTERY BYPASS GRAFT  03/2006   DIRECT LARYNGOSCOPY N/A 12/11/2020   Procedure: DIRECT LARYNGOSCOPY WITH BIOPSY;  Surgeon: Izora Gala, MD;  Location: [place];  Service: ENT;  Laterality: N/A;   ENDOBRONCHIAL ULTRASOUND N/A 01/08/2021   Procedure: ENDOBRONCHIAL ULTRASOUND;  Surgeon: [name], MD;  [facility];  Service: Pulmonary;  Laterality: N/A;   ESOPHAGOSCOPY N/A 12/11/2020   Procedure: ESOPHAGOSCOPY;  Surgeon: Izora Gala, MD;  Location: [place];  Service: ENT;  Laterality: N/A;   FINE NEEDLE ASPIRATION  01/08/2021   Procedure: FINE NEEDLE ASPIRATION;  Surgeon: [name], MD;  [facility];  Service: Pulmonary;;   [place] CATH AND CORS/GRAFTS ANGIOGRAPHY N/A 03/13/2018   Procedure: LEFT HEART CATH AND CORS/GRAFTS ANGIOGRAPHY;  Surgeon: Troy Sine, MD;  Location: [place] CV LAB;  Service: Cardiovascular;  Laterality: N/A;   PACEMAKER IMPLANT N/A 06/26/2020   Procedure: PACEMAKER IMPLANT;  Surgeon: Evans Lance, MD;  Location: [place] CV LAB;  Service: Cardiovascular;  Laterality: N/A;   RADICAL NECK DISSECTION Right 12/11/2020  Procedure: RIGHT MODIFIED NECK DISSECTION;  Surgeon: Izora Gala, MD;  Location: [place];  Service: ENT;  Laterality: Right;   SUPRAVENTRICULAR TACHYCARDIA ABLATION N/A 09/03/2012   Procedure: SUPRAVENTRICULAR TACHYCARDIA ABLATION;  Surgeon: Evans Lance, MD;  Location:  [facility] C[facility];  Service: Cardiovascular;  Laterality: N/A;   TONSILLECTOMY Bilateral 12/11/2020   Procedure: BILATERAL TONSILLECTOMY;  Surgeon: Izora Gala, MD;  Location: [place];  Service: ENT;  Laterality: Bilateral;   VIDEO BRONCHOSCOPY  01/08/2021   Procedure: VIDEO  BRONCHOSCOPY WITHOUT FLUORO;  Surgeon: [name], MD;  [facility];  Service: Pulmonary;;     Current Outpatient Medications  Medication Sig Dispense Refill   aspirin 81 MG chewable tablet Chew 1 tablet (81 mg total) by mouth daily. 90 tablet 3   cholecalciferol (VITAMIN D3) 25 MCG (1000 UNIT) tablet Take 1,000 Units by mouth once a week.     HYDROcodone-acetaminophen (HYCET) 7.5-325 mg/15 ml solution Take 15 mLs by mouth every 6 (six) hours as needed for moderate pain. 420 mL 0   metoprolol succinate (TOPROL-XL) 50 MG 24 hr tablet Take 50 mg by mouth daily. Take with or immediately following a meal.     omeprazole (PRILOSEC) 40 MG capsule Take 40 mg by mouth daily.     OVER THE COUNTER MEDICATION Take 2 tablets by mouth at bedtime as needed (sleep). Relaxium Sleep     phenol (CHLORASEPTIC) 1.4 % LIQD Use as directed 1 spray in the mouth or throat as needed for throat irritation / pain.     rosuvastatin (CRESTOR) 40 MG tablet Take 40 mg by mouth daily.     sildenafil (VIAGRA) 50 MG tablet Take 1 tablet (50 mg total) by mouth daily as needed for erectile dysfunction. 20 tablet 3   No current facility-administered medications for this visit.    Allergies:   Patient has no known allergies.    ROS:  Please see the history of present illness.   Otherwise, review of systems are positive for none.   All other systems are reviewed and negative.    PHYSICAL EXAM: VS:  BP 102/64 (BP Location: Left Arm, Patient Position: Sitting, Cuff Size: Normal)    Pulse 69    Ht 6' (1.829 m)    Wt 186 lb (84.4 kg)    BMI 25.23 kg/m  , BMI Body mass index is 25.23 kg/m. GENERAL:  Well appearing NECK:  No jugular venous distention, waveform within normal limits, carotid upstroke brisk and symmetric, no bruits, no thyromegaly LUNGS:  Clear to auscultation bilaterally CHEST: Well-healed sternotomy scar and pacemaker pocket HEART:  PMI not displaced or sustained,S1 and S2 within normal  limits, no S3, no S4, no clicks, no rubs, no murmurs ABD:  Flat, positive bowel sounds normal in frequency in pitch, no bruits, no rebound, no guarding, no midline pulsatile mass, no hepatomegaly, no splenomegaly EXT:  2 plus pulses throughout, no edema, no cyanosis no clubbing   EKG:  EKG is  ordered today. Normal sinus rhythm with ventricular pacing rate 69  Recent Labs: 04/25/2020: ALT 20 12/07/2020: Hemoglobin 13.4; Platelets 219; Potassium 4.9; Sodium 140 01/09/2021: BUN 14; Creatinine 1.13; TSH 0.536    Lipid Panel    Component Value Date/Time   CHOL 158 04/25/2020 0857   TRIG 142 04/25/2020 0857   HDL 47 04/25/2020 0857   CHOLHDL 3.4 04/25/2020 0857   CHOLHDL 4.2 03/13/2018 2055   VLDL 14 03/13/2018 2055   LDLCALC 86 04/25/2020 0857   LDLDIRECT 114.1  06/16/2012 1032    CARDIAC CATH  03/13/2018     Wt Readings from Last 3 Encounters:  01/19/21 186 lb (84.4 kg)  01/11/21 188 lb 14.4 oz (85.7 kg)  01/10/21 190 lb 9.6 oz (86.5 kg)      Other studies Reviewed: Additional studies/ records that were reviewed today include: EP records Review of the above records demonstrates:   See elsewhere   ASSESSMENT AND PLAN:  CAD s/p CABG:    The patient has no new sypmtoms.  No further cardiovascular testing is indicated.  We will continue with aggressive risk reduction and meds as listed.  Hypertension:   The blood pressure is actually running low.  Not serious he loses weight through this ordeal we might have to back off on his beta-blocker.   Hyperlipidemia:    LDL was 86 with an HDL of 47.  Given what he is about to go through and not can make any changes.    Bradycardia:   He is now status post pacemaker.    SVT:   He has had no tachypalpitations.  No change in therapy.   Current medicines are reviewed at length with the patient today.  The patient does not have concerns regarding medicines.  The following changes have been made:  None  Labs/ tests ordered today  include:     None  Orders Placed This Encounter  Procedures   EKG 12-Lead      Disposition:   FU with me in 12 months.    Signed, Minus Breeding, MD  01/19/2021 9:19 AM    [facility]

## 2021-01-18 NOTE — Therapy (Signed)
[facility] [address] [place], [place], [address] Phone: [phone]   Fax:  [phone]  Speech Language Pathology Evaluation  Patient Details  Name: Steven Mathews MRN: [id number] Date of Birth: [birth year] Referring Provider (SLP): Eppie Gibson, MD   Encounter Date: 01/18/2021    Past Medical History:  Diagnosis Date   Atrial fibrillation ([place])    CAD (coronary artery disease)    CABG 2008; 03/13/2018 NSTEMI due to acute thrombotic occlusion of RCA graft, patent LIMA to LAD.   Chronic cough    GERD (gastroesophageal reflux disease)    Hx of adenomatous colonic polyps 04/04/2016   Hyperlipidemia    Hypertension    Myocardial infarct [facility])    Presence of permanent cardiac pacemaker    SVT (supraventricular tachycardia) ([place])     Past Surgical History:  Procedure Laterality Date   CORONARY ARTERY BYPASS GRAFT  03/2006   DIRECT LARYNGOSCOPY N/A 12/11/2020   Procedure: DIRECT LARYNGOSCOPY WITH BIOPSY;  Surgeon: Izora Gala, MD;  Location: [place];  Service: ENT;  Laterality: N/A;   ENDOBRONCHIAL ULTRASOUND N/A 01/08/2021   Procedure: ENDOBRONCHIAL ULTRASOUND;  Surgeon: [name], MD;  Location: [facility];  Service: Pulmonary;  Laterality: N/A;   ESOPHAGOSCOPY N/A 12/11/2020   Procedure: ESOPHAGOSCOPY;  Surgeon: Izora Gala, MD;  Location: [place];  Service: ENT;  Laterality: N/A;   FINE NEEDLE ASPIRATION  01/08/2021   Procedure: FINE NEEDLE ASPIRATION;  Surgeon: [name], MD;  Location: [facility];  Service: Pulmonary;;   [place] CATH AND CORS/GRAFTS ANGIOGRAPHY N/A 03/13/2018   Procedure: LEFT HEART CATH AND CORS/GRAFTS ANGIOGRAPHY;  Surgeon: Troy Sine, MD;  Location: [place] CV LAB;  Service: Cardiovascular;  Laterality: N/A;   PACEMAKER IMPLANT N/A 06/26/2020   Procedure: PACEMAKER IMPLANT;  Surgeon: Evans Lance, MD;  Location: [place] CV LAB;  Service:  Cardiovascular;  Laterality: N/A;   RADICAL NECK DISSECTION Right 12/11/2020   Procedure: RIGHT MODIFIED NECK DISSECTION;  Surgeon: Izora Gala, MD;  Location: [place];  Service: ENT;  Laterality: Right;   SUPRAVENTRICULAR TACHYCARDIA ABLATION N/A 09/03/2012   Procedure: SUPRAVENTRICULAR TACHYCARDIA ABLATION;  Surgeon: Evans Lance, MD;  Location: [facility] CATH LAB;  Service: Cardiovascular;  Laterality: N/A;   TONSILLECTOMY Bilateral 12/11/2020   Procedure: BILATERAL TONSILLECTOMY;  Surgeon: Izora Gala, MD;  Location: [place];  Service: ENT;  Laterality: Bilateral;   VIDEO BRONCHOSCOPY  01/08/2021   Procedure: VIDEO BRONCHOSCOPY WITHOUT FLUORO;  Surgeon: [name], MD;  Location: [facility];  Service: Pulmonary;;    There were no vitals filed for this visit.   Subjective Assessment - 01/18/21 0947     Subjective Pt does not find he is eating drier or "grittier" foods mostly due to pain.    Currently in Pain? 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 He presented to his PCP on 09/01/20 with esophageal dysphagia. He was referred to GI on 09/28/20 during which he reported progressive dysphagia over the past several months. 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 However, some areas of thickening of the wall were appreciated, noted as concerning for the possibility of metastatic carcinoma. 10/02/20 He saw Dr. Constance Holster in consult and he performed a FNA of a right neck mass revealing SCC. 11/29/20 PET revealed findings suspicious for neoplasm of the head and neck with right neck adenopathy. Asymmetric uptake at the level of the oropharyngeal tonsillar tissue in base of tongue was appreciated as well, noted to be greatest on the left. Moderate increased metabolic activity was also seen associated with a (presumed) lymph node at the right hilum. 12/11/20 Dr. Constance Holster completed multiple biopsies and excisions. 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 Left tongue base biopsy: non-keratinizing SCC, right tongue base, negative for carcinoma, left and right tonsil excisions, negative for carcinoma. 01/08/21 Biopsy of right hilar lymphadenopathy- negative for carcinoma.  He will receive 35 fractions of radiation to his base of tongue and bilateral neck. He will start on 12/19 and complete 03/13/21.      Prior Functional Status   Cognitive/Linguistic Baseline Within functional limits      Cognition   Overall Cognitive Status Within Functional Limits for tasks assessed      Auditory Comprehension   Overall Auditory Comprehension Appears within functional limits for tasks assessed      Reading Comprehension   Reading Status Within funtional limits      Verbal Expression   Overall Verbal Expression Appears within functional limits for tasks assessed      Oral Motor/Sensory Function   Overall Oral Motor/Sensory Function Appears within functional limits for tasks assessed    Lingual ROM Reduced right   to posterior     Motor Speech   Overall Motor Speech Appears within functional limits for tasks assessed             Pt currently tolerates softer  diet/thin liquids, mostly due to bil throat pain - he thinks probably from tonsilectomy or biposy sxs. POs: Pt ate ham sandwich and drank water without overt s/s aspiration. However, incr'd mastication time and incr'd carefulness with bolus manipulation was observed with each bite. Thyroid elevation appeared WNL, and swallows appeared timely once mastication and bolus formation was complete. Oral residue noted as minimal.WNL. Pt's swallow deemed WFL at this time, due to more difficulty with mastication and bolus manipulation - pt's lingual ROM was a little limited on lt - this is likely the difficulty pt is having with bolus manipulation.   Because data states the risk for dysphagia during and after radiation treatment is high due to undergoing radiation tx, SLP taught pt about the possibility of reduced/limited ability for PO intake during rad tx. SLP encouraged pt to continue swallowing POs as far into rad tx as possible, even ingesting POs and/or completing HEP shortly after administration of pain meds. Among other  modifications for days when pt cannot functionally swallow, SLP talked about performing only non-swallowing tasks on the handout/HEP, and if necessary to cycle through the swallowing portion so the program of exercises can be completed instead of fatiguing on one of the swallowing exercises and not being able to perform the other swallowing exercises. Pt was then told to add all reps of swallowing tasks back in when it becomes possible to do so.  SLP educated pt re: changes to swallowing musculature after rad tx, and why adherence to dysphagia HEP provided today and PO consumption was necessary to inhibit muscular disuse atrophy and to reduce muscle fibrosis following rad tx. Pt demonstrated understanding of these things to SLP.    SLP then developed a HEP for pt and pt was instructed how to perform exercises involving lingual, vocal, and pharyngeal strengthening. SLP performed each exercise and  pt return demonstrated each exercise. SLP ensured pt performance was correct prior to moving on to next exercise. Pt was instructed to complete this program 2 times a day, 7 days/week until 6 months after his last rad tx, then x2-3 a week after that.     SLP Education - 01/18/21       Education Details HEP procedure, late effects head/neck radiation on swallow ability     Person(s) Educated Patient     Methods Explanation;Demonstration;Verbal cues;Handout     Comprehension Verbalized understanding;Returned demonstration;Verbal cues required;Need further instruction                     SLP Short Term Goals - 01/18/21                 SLP SHORT TERM GOAL #1    Title pt will complete HEP with rare min A     Time 1     Period --   visits, for all STGs    Status New     Target Date 02/16/21          SLP SHORT TERM GOAL #2    Title pt will tell SLP why pt is completing HEP with modified independence     Time 2     Status New     Target Date 03/16/21          SLP SHORT TERM GOAL #3    Title pt will describe 3 overt s/s aspiration PNA with modified independence     Time 3     Target Date 04/13/21               SLP SHORT TERM GOAL #4  Title pt will tell SLP how a food journal can facilitate quicker return to more normal POs following rad tx  Time 2  Target Date 04/13/21                        SLP Long Term Goals - 01/18/21                 SLP LONG TERM GOAL #1    Title pt will complete HEP with modified independence in 2 sessions     Time 4     Period --   visits, for all LTGs    Status New     Target Date 05/18/21          SLP LONG TERM GOAL #2    Title pt will describe how to modify HEP over time, and the timeline associated with reduction  in HEP frequency with modified independence over two sessions     Time 5     Target Date 06/22/21                     Plan - 01/18/21        Clinical Impression Statement See above for description of pt's swallow  status. SLP designed an individualized HEP for dysphagia and pt completed each exercise on their own with usual mod cues faded to modified independence. There are no overt s/s aspiration reported by pt at this time. Data indicate that pt's swallow ability will likely decrease over the course of radiation therapy and could very well decline over time following conclusion of their radiation therapy due to muscle disuse atrophy and/or muscle fibrosis. Pt will cont to need to be seen by SLP in order to assess safety of PO intake, assess the need for recommending any objective swallow assessment, and ensuring pt correctly completes the individualized HEP.     Speech Therapy Frequency --   once approx every 4 weeks    Duration --   90 days    Treatment/Interventions Aspiration precaution training;Pharyngeal strengthening exercises;Diet toleration management by SLP;Compensatory techniques;SLP instruction and feedback;Patient/family education;Compensatory strategies     Potential to Achieve Goals Good     SLP Home Exercise Plan provided today     Consulted and Agree with Plan of Care Patient                         Patient will benefit from skilled therapeutic intervention in order to improve the following deficits and impairments:   Dysphagia, unspecified type - Plan: SLP plan of care cert/re-cert    Problem List 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   Malignant neoplasm of base of tongue ([place]) 12/27/2020   Pacemaker 12/19/2020   Neck mass 12/11/2020   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[place])  12/11/2020   Bradycardia 08/01/2020   Heart block 06/13/2020   Therapeutic drug monitoring 09/16/2018   Rash 05/25/2018   Essential hypertension 05/25/2018   Educated about COVID-19 virus infection 05/25/2018   Acute ST elevation myocardial infarction (STEMI) due to occlusion of right coronary artery ([place]) 03/13/2018   Hx of CABG    Hx of adenomatous colonic polyps 04/04/2016   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, [place], [place], [address] Phone: [phone]   Fax:  [phone]  Name: Steven Mathews MRN: [id number] Date of Birth: [birth year]

## 2021-01-19 ENCOUNTER — Encounter: Payer: Self-pay | Admitting: Cardiology

## 2021-01-19 ENCOUNTER — Ambulatory Visit (INDEPENDENT_AMBULATORY_CARE_PROVIDER_SITE_OTHER): Payer: Medicare (Managed Care) | Admitting: Cardiology

## 2021-01-19 VITALS — BP 102/64 | HR 69 | Ht 72.0 in | Wt 186.0 lb

## 2021-01-19 DIAGNOSIS — E785 Hyperlipidemia, unspecified: Secondary | ICD-10-CM

## 2021-01-19 DIAGNOSIS — I471 Supraventricular tachycardia, unspecified: Secondary | ICD-10-CM

## 2021-01-19 DIAGNOSIS — I2581 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: Secondary | ICD-10-CM

## 2021-01-19 DIAGNOSIS — I10 Essential (primary) hypertension: Secondary | ICD-10-CM | POA: Diagnosis not present

## 2021-01-19 DIAGNOSIS — R001 Bradycardia, unspecified: Secondary | ICD-10-CM | POA: Diagnosis not present

## 2021-01-19 NOTE — Patient Instructions (Signed)
Medication Instructions:  Your Physician recommend you continue on your current medication as directed.    *If you need a refill on your cardiac medications before your next appointment, please call your pharmacy*  Follow-Up: At [facility], you and your health needs are our priority.  As part of our continuing mission to provide you with exceptional heart care, we have created designated Provider Care Teams.  These Care Teams include your primary Cardiologist (physician) and Advanced Practice Providers (APPs -  Physician Assistants and Nurse Practitioners) who all work together to provide you with the care you need, when you need it.  We recommend signing up for the patient portal called "MyChart".  Sign up information is provided on this After Visit Summary.  MyChart is used to connect with patients for Virtual Visits (Telemedicine).  Patients are able to view lab/test results, encounter notes, upcoming appointments, etc.  Non-urgent messages can be sent to your provider as well.   To learn more about what you can do with MyChart, go to [email or web address].    Your next appointment:   1 year(s)  The format for your next appointment:   In Person  Provider:   Minus Breeding, MD

## 2021-01-22 ENCOUNTER — Other Ambulatory Visit: Payer: Self-pay

## 2021-01-22 ENCOUNTER — Other Ambulatory Visit: Payer: Self-pay | Admitting: Radiation Oncology

## 2021-01-22 ENCOUNTER — Ambulatory Visit
Admission: RE | Admit: 2021-01-22 | Discharge: 2021-01-22 | Disposition: A | Discharge status: Home / Self Care | Payer: Medicare (Managed Care) | Source: Ambulatory Visit | Attending: Radiation Oncology | Admitting: Radiation Oncology

## 2021-01-22 DIAGNOSIS — Z510 Encounter for antineoplastic radiation therapy: Secondary | ICD-10-CM | POA: Diagnosis not present

## 2021-01-22 DIAGNOSIS — C01 Malignant neoplasm of base of tongue: Secondary | ICD-10-CM

## 2021-01-22 MED ORDER — LIDOCAINE VISCOUS HCL 2 % MT SOLN: OROMUCOSAL | 3 refills | Status: DC

## 2021-01-22 NOTE — Progress Notes (Signed)
Oncology Nurse Navigator Documentation   To provide support, encouragement and care continuity, met with Mr. Reeder after his initial RT.  Mr. Lightsey completed treatment without difficulty, denied questions/concerns. I encouraged him to call me with questions/concerns as tmts proceed.   Harlow Asa RN, BSN, OCN Head & Neck Oncology Nurse [place] at [facility] Phone # [phone]  Fax # [phone]

## 2021-01-23 ENCOUNTER — Ambulatory Visit
Admission: RE | Admit: 2021-01-23 | Discharge: 2021-01-23 | Disposition: A | Discharge status: Home / Self Care | Payer: Medicare (Managed Care) | Source: Ambulatory Visit | Attending: Radiation Oncology | Admitting: Radiation Oncology

## 2021-01-23 DIAGNOSIS — Z510 Encounter for antineoplastic radiation therapy: Secondary | ICD-10-CM | POA: Diagnosis not present

## 2021-01-24 ENCOUNTER — Other Ambulatory Visit: Payer: Self-pay

## 2021-01-24 ENCOUNTER — Ambulatory Visit
Admission: RE | Admit: 2021-01-24 | Discharge: 2021-01-24 | Disposition: A | Discharge status: Home / Self Care | Payer: Medicare (Managed Care) | Source: Ambulatory Visit | Attending: Radiation Oncology | Admitting: Radiation Oncology

## 2021-01-24 DIAGNOSIS — Z510 Encounter for antineoplastic radiation therapy: Secondary | ICD-10-CM | POA: Diagnosis not present

## 2021-01-25 ENCOUNTER — Ambulatory Visit
Admission: RE | Admit: 2021-01-25 | Discharge: 2021-01-25 | Disposition: A | Discharge status: Home / Self Care | Payer: Medicare (Managed Care) | Source: Ambulatory Visit | Attending: Radiation Oncology | Admitting: Radiation Oncology

## 2021-01-25 DIAGNOSIS — Z510 Encounter for antineoplastic radiation therapy: Secondary | ICD-10-CM | POA: Diagnosis not present

## 2021-01-26 ENCOUNTER — Other Ambulatory Visit: Payer: Self-pay

## 2021-01-26 ENCOUNTER — Ambulatory Visit
Admission: RE | Admit: 2021-01-26 | Discharge: 2021-01-26 | Disposition: A | Discharge status: Home / Self Care | Payer: Medicare (Managed Care) | Source: Ambulatory Visit | Attending: Radiation Oncology | Admitting: Radiation Oncology

## 2021-01-26 ENCOUNTER — Inpatient Hospital Stay: Payer: Medicare (Managed Care) | Admitting: Dietician

## 2021-01-26 DIAGNOSIS — Z510 Encounter for antineoplastic radiation therapy: Secondary | ICD-10-CM | POA: Diagnosis not present

## 2021-01-30 ENCOUNTER — Ambulatory Visit
Admission: RE | Admit: 2021-01-30 | Discharge: 2021-01-30 | Disposition: A | Discharge status: Home / Self Care | Payer: Medicare (Managed Care) | Source: Ambulatory Visit | Attending: Radiation Oncology | Admitting: Radiation Oncology

## 2021-01-30 ENCOUNTER — Other Ambulatory Visit: Payer: Self-pay

## 2021-01-30 ENCOUNTER — Other Ambulatory Visit: Payer: Self-pay | Admitting: Radiation Oncology

## 2021-01-30 ENCOUNTER — Inpatient Hospital Stay: Payer: Medicare (Managed Care) | Admitting: Nutrition

## 2021-01-30 DIAGNOSIS — C01 Malignant neoplasm of base of tongue: Secondary | ICD-10-CM

## 2021-01-30 DIAGNOSIS — R638 Other symptoms and signs concerning food and fluid intake: Secondary | ICD-10-CM

## 2021-01-30 DIAGNOSIS — Z510 Encounter for antineoplastic radiation therapy: Secondary | ICD-10-CM | POA: Diagnosis not present

## 2021-01-30 DIAGNOSIS — C770 Secondary and unspecified malignant neoplasm of lymph nodes of head, face and neck: Secondary | ICD-10-CM

## 2021-01-30 MED ORDER — ONDANSETRON HCL 4 MG PO TABS: 4.0000 mg | ORAL_TABLET | Freq: Three times a day (TID) | ORAL | 0 refills | Status: DC | PRN

## 2021-01-30 NOTE — Progress Notes (Signed)
i

## 2021-01-30 NOTE — Progress Notes (Signed)
Nutrition follow-up completed with patient after radiation therapy.  Patient is receiving radiation for malignant base of tongue cancer.  He is followed by Dr. Isidore Moos.  Weight has decreased significantly to 178.2 pounds on December 27.  This is decreased from 197 pounds on November 7.  This is a 10% weight loss which is significant.  Patient reports he is continue with nausea.  This is making it difficult to consume adequate calories and protein.  He continues to have some taste alterations.  His throat is sore and he has a little dysphagia.  He has been trying to drink nutrition shakes but he cannot consume adequate calories at this time. Patient reports MD is going to order nausea medication.  He has questions about a feeding tube but would like to see if the nausea medication helps him to increase his oral intake.  He suspects he will need a feeding tube.  Nutrition diagnosis: Predicted sub optimal energy intake has evolved into inadequate oral intake related to metastatic tongue cancer and associated treatments as evidenced by 10% weight loss in less than 3 months.  Intervention: Educated on importance of baking soda and salt water rinses for improved taste. Educated on strategies for improving nausea including nausea medications and bland foods.  Provided nutrition fact sheets. Provided some education on feeding tube placement in tube feeding administration.  Answered multiple questions. Encouraged patient to continue oral nutrition supplements/shakes.  Monitoring, evaluation, goals: Patient will tolerate increased calories and protein to minimize further weight loss.  Will monitor for decision on feeding tube placement.  Next visit: Tuesday, January 3 after radiation therapy.  **Disclaimer: This note was dictated with voice recognition software. Similar sounding words can inadvertently be transcribed and this note may contain transcription errors which may not have been corrected upon  publication of note.**

## 2021-01-31 ENCOUNTER — Ambulatory Visit
Admission: RE | Admit: 2021-01-31 | Discharge: 2021-01-31 | Disposition: A | Discharge status: Home / Self Care | Payer: Medicare (Managed Care) | Source: Ambulatory Visit | Attending: Radiation Oncology | Admitting: Radiation Oncology

## 2021-01-31 ENCOUNTER — Telehealth (HOSPITAL_COMMUNITY): Payer: Self-pay

## 2021-01-31 DIAGNOSIS — Z510 Encounter for antineoplastic radiation therapy: Secondary | ICD-10-CM | POA: Diagnosis not present

## 2021-01-31 NOTE — Telephone Encounter (Signed)
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 Isidore Moos [phone]  Imaging: CT chest and NM Pet in epic  Please review.   Thanks,  Lia Foyer

## 2021-01-31 NOTE — Progress Notes (Signed)
Oncology Nurse Navigator Documentation   Met with Mr. Lina to provide PEG education prior to 02/06/21 placement. Provided port educational handout, showed example, provided guidance for post-surgical dsg removal, site care.  Using  PEG teaching device   and Teach Back, provided education for PEG use and care, including: hand hygiene, gravity bolus administration of daily water flushes and nutritional supplement, fluids and medications; care of tube insertion site including daily dressing change and cleaning; S&S of infection.   Mr. Gratz correctly verbalized procedures for and provided correct return demonstration of gravity administration of water, dressing change and site care.  I provided written instructions for PEG flushing/dressing change in support of verbal instruction.   I provided/described contents of Start of Care Bolus Feeding Kit (3 60 cc syringes, 2 boxes 4x4 drainage sponges, 1 package mesh briefs, 1 roll paper tape, 1 case Osmolite 1.5).  He voiced understanding he is to start using Osmolite per guidance of Nutrition. He understands I will be available for ongoing PEG support. Provided barium sulfate prep which I obtained from [facility] with instructions for use.   Harlow Asa RN, BSN, OCN Head & Neck Oncology Nurse [place] at [facility] Phone # [phone]  Fax # [phone]

## 2021-02-01 ENCOUNTER — Ambulatory Visit
Admission: RE | Admit: 2021-02-01 | Discharge: 2021-02-01 | Disposition: A | Discharge status: Home / Self Care | Payer: Medicare (Managed Care) | Source: Ambulatory Visit | Attending: Radiation Oncology | Admitting: Radiation Oncology

## 2021-02-01 ENCOUNTER — Other Ambulatory Visit: Payer: Self-pay

## 2021-02-01 ENCOUNTER — Ambulatory Visit (HOSPITAL_COMMUNITY): Payer: Medicare (Managed Care)

## 2021-02-01 DIAGNOSIS — Z510 Encounter for antineoplastic radiation therapy: Secondary | ICD-10-CM | POA: Diagnosis not present

## 2021-02-02 ENCOUNTER — Other Ambulatory Visit: Payer: Self-pay | Admitting: Radiology

## 2021-02-02 ENCOUNTER — Ambulatory Visit
Admission: RE | Admit: 2021-02-02 | Discharge: 2021-02-02 | Disposition: A | Discharge status: Home / Self Care | Payer: Medicare (Managed Care) | Source: Ambulatory Visit | Attending: Radiation Oncology | Admitting: Radiation Oncology

## 2021-02-02 ENCOUNTER — Other Ambulatory Visit (HOSPITAL_COMMUNITY): Payer: Self-pay | Admitting: Physician Assistant

## 2021-02-02 DIAGNOSIS — Z510 Encounter for antineoplastic radiation therapy: Secondary | ICD-10-CM | POA: Diagnosis not present

## 2021-02-06 ENCOUNTER — Ambulatory Visit
Admission: RE | Admit: 2021-02-06 | Discharge: 2021-02-06 | Disposition: A | Discharge status: Home / Self Care | Payer: Medicare Other | Source: Ambulatory Visit | Attending: Radiation Oncology | Admitting: Radiation Oncology

## 2021-02-06 ENCOUNTER — Other Ambulatory Visit: Payer: Self-pay | Admitting: Radiology

## 2021-02-06 ENCOUNTER — Other Ambulatory Visit: Payer: Self-pay

## 2021-02-06 ENCOUNTER — Inpatient Hospital Stay: Payer: Medicare (Managed Care) | Attending: Radiation Oncology | Admitting: Nutrition

## 2021-02-06 ENCOUNTER — Encounter (HOSPITAL_COMMUNITY): Payer: Self-pay

## 2021-02-06 ENCOUNTER — Ambulatory Visit (HOSPITAL_COMMUNITY)
Admission: RE | Admit: 2021-02-06 | Discharge: 2021-02-06 | Disposition: A | Discharge status: Home / Self Care | Payer: Medicare Other | Source: Ambulatory Visit | Attending: Radiation Oncology | Admitting: Radiation Oncology

## 2021-02-06 ENCOUNTER — Other Ambulatory Visit: Payer: Self-pay | Admitting: Radiation Oncology

## 2021-02-06 ENCOUNTER — Inpatient Hospital Stay: Payer: Medicare (Managed Care) | Admitting: Nutrition

## 2021-02-06 DIAGNOSIS — C770 Secondary and unspecified malignant neoplasm of lymph nodes of head, face and neck: Secondary | ICD-10-CM

## 2021-02-06 DIAGNOSIS — R1310 Dysphagia, unspecified: Secondary | ICD-10-CM | POA: Diagnosis not present

## 2021-02-06 DIAGNOSIS — C029 Malignant neoplasm of tongue, unspecified: Secondary | ICD-10-CM | POA: Insufficient documentation

## 2021-02-06 DIAGNOSIS — R634 Abnormal weight loss: Secondary | ICD-10-CM | POA: Insufficient documentation

## 2021-02-06 DIAGNOSIS — C01 Malignant neoplasm of base of tongue: Secondary | ICD-10-CM

## 2021-02-06 DIAGNOSIS — Z510 Encounter for antineoplastic radiation therapy: Secondary | ICD-10-CM | POA: Diagnosis present

## 2021-02-06 DIAGNOSIS — R638 Other symptoms and signs concerning food and fluid intake: Secondary | ICD-10-CM

## 2021-02-06 DIAGNOSIS — Z7982 Long term (current) use of aspirin: Secondary | ICD-10-CM | POA: Insufficient documentation

## 2021-02-06 DIAGNOSIS — Z6823 Body mass index (BMI) 23.0-23.9, adult: Secondary | ICD-10-CM | POA: Diagnosis not present

## 2021-02-06 HISTORY — PX: IR GASTROSTOMY TUBE MOD SED: IMG625

## 2021-02-06 LAB — CBC
HCT: 39.4 % (ref 39.0–52.0)
Hemoglobin: 13.5 g/dL (ref 13.0–17.0)
MCH: 31.8 pg (ref 26.0–34.0)
MCHC: 34.3 g/dL (ref 30.0–36.0)
MCV: 92.7 fL (ref 80.0–100.0)
Platelets: 179 10*3/uL (ref 150–400)
RBC: 4.25 MIL/uL (ref 4.22–5.81)
RDW: 12.1 % (ref 11.5–15.5)
WBC: 5.7 10*3/uL (ref 4.0–10.5)
nRBC: 0 % (ref 0.0–0.2)

## 2021-02-06 LAB — CMP (CANCER CENTER ONLY)
ALT: 10 U/L (ref 0–44)
AST: 15 U/L (ref 15–41)
Albumin: 4.2 g/dL (ref 3.5–5.0)
Alkaline Phosphatase: 58 U/L (ref 38–126)
Anion gap: 10 (ref 5–15)
BUN: 17 mg/dL (ref 8–23)
CO2: 27 mmol/L (ref 22–32)
Calcium: 9.9 mg/dL (ref 8.9–10.3)
Chloride: 102 mmol/L (ref 98–111)
Creatinine: 1.11 mg/dL (ref 0.61–1.24)
GFR, Estimated: >60 mL/min (ref >60)
Glucose, Bld: 93 mg/dL (ref 70–99)
Potassium: 4.4 mmol/L (ref 3.5–5.1)
Sodium: 139 mmol/L (ref 135–145)
Total Bilirubin: 0.4 mg/dL (ref 0.3–1.2)
Total Protein: 7.2 g/dL (ref 6.5–8.1)

## 2021-02-06 LAB — PHOSPHORUS: Phosphorus: 4 mg/dL (ref 2.5–4.6)

## 2021-02-06 LAB — PROTIME-INR
INR: 1 (ref 0.8–1.2)
Prothrombin Time: 13 seconds (ref 11.4–15.2)

## 2021-02-06 LAB — MAGNESIUM: Magnesium: 2 mg/dL (ref 1.7–2.4)

## 2021-02-06 MED ORDER — CEFAZOLIN SODIUM-DEXTROSE 2-4 GM/100ML-% IV SOLN
INTRAVENOUS | Status: AC
  Administered 2021-02-06: 2 g via INTRAVENOUS
  Filled 2021-02-06: qty 100

## 2021-02-06 MED ORDER — IOHEXOL 300 MG/ML  SOLN
100.0000 mL | Freq: Once | INTRAMUSCULAR | Status: AC | PRN
  Administered 2021-02-06: 20 mL

## 2021-02-06 MED ORDER — MIDAZOLAM HCL 2 MG/2ML IJ SOLN
INTRAMUSCULAR | Status: AC
  Filled 2021-02-06: qty 4

## 2021-02-06 MED ORDER — CEFAZOLIN SODIUM-DEXTROSE 2-4 GM/100ML-% IV SOLN: 2.0000 g | INTRAVENOUS | Status: AC

## 2021-02-06 MED ORDER — ONDANSETRON HCL 8 MG PO TABS: 8.0000 mg | ORAL_TABLET | Freq: Three times a day (TID) | ORAL | 2 refills | Status: DC | PRN

## 2021-02-06 MED ORDER — MIDAZOLAM HCL 2 MG/2ML IJ SOLN
INTRAMUSCULAR | Status: AC | PRN
  Administered 2021-02-06: 1 mg via INTRAVENOUS
  Administered 2021-02-06: .5 mg via INTRAVENOUS

## 2021-02-06 MED ORDER — FENTANYL CITRATE (PF) 100 MCG/2ML IJ SOLN
INTRAMUSCULAR | Status: AC
  Filled 2021-02-06: qty 4

## 2021-02-06 MED ORDER — FENTANYL CITRATE (PF) 100 MCG/2ML IJ SOLN
INTRAMUSCULAR | Status: AC | PRN
  Administered 2021-02-06: 50 ug via INTRAVENOUS
  Administered 2021-02-06: 25 ug via INTRAVENOUS

## 2021-02-06 MED ORDER — HYDROCODONE-ACETAMINOPHEN 7.5-325 MG PO TABS: 1.0000 | ORAL_TABLET | ORAL | 0 refills | Status: DC | PRN

## 2021-02-06 MED ORDER — LIDOCAINE HCL 1 % IJ SOLN
INTRAMUSCULAR | Status: AC
  Filled 2021-02-06: qty 20

## 2021-02-06 MED ORDER — GLUCAGON HCL RDNA (DIAGNOSTIC) 1 MG IJ SOLR
INTRAMUSCULAR | Status: AC
  Filled 2021-02-06: qty 1

## 2021-02-06 MED ORDER — KATE FARMS STANDARD 1.4 EN LIQD: 1.0000 | Freq: Every day | ENTERAL | 6 refills | Status: DC

## 2021-02-06 NOTE — H&P (Signed)
Chief Complaint: Patient was seen in consultation today for percutaneous gastric tube placement at the request of [place]  Referring Physician(s): Eppie Gibson  Supervising Physician: Aletta Edouard  Patient Status: [facility] - Out-pt  History of Present Illness: Steven Mathews is a 76 y.o. male   Tongue cancer Dysphagia secondary radiation Wt loss Request for gastric tube placement per MD Has stopped ASA 3 days Has had G tube training    Past Medical History:  Diagnosis Date   Atrial fibrillation ([place])    CAD (coronary artery disease)    CABG 2008; 03/13/2018 NSTEMI due to acute thrombotic occlusion of RCA graft, patent LIMA to LAD.   Chronic cough    GERD (gastroesophageal reflux disease)    Hx of adenomatous colonic polyps 04/04/2016   Hyperlipidemia    Hypertension    Myocardial infarct [facility])    Presence of permanent cardiac pacemaker    SVT (supraventricular tachycardia) ([place])     Past Surgical History:  Procedure Laterality Date   CORONARY ARTERY BYPASS GRAFT  03/2006   DIRECT LARYNGOSCOPY N/A 12/11/2020   Procedure: DIRECT LARYNGOSCOPY WITH BIOPSY;  Surgeon: Izora Gala, MD;  Location: [place];  Service: ENT;  Laterality: N/A;   ENDOBRONCHIAL ULTRASOUND N/A 01/08/2021   Procedure: ENDOBRONCHIAL ULTRASOUND;  Surgeon: [name], MD;  [facility];  Service: Pulmonary;  Laterality: N/A;   ESOPHAGOSCOPY N/A 12/11/2020   Procedure: ESOPHAGOSCOPY;  Surgeon: Izora Gala, MD;  Location: [place];  Service: ENT;  Laterality: N/A;   FINE NEEDLE ASPIRATION  01/08/2021   Procedure: FINE NEEDLE ASPIRATION;  Surgeon: [name], MD;  [facility];  Service: Pulmonary;;   [place] CATH AND CORS/GRAFTS ANGIOGRAPHY N/A 03/13/2018   Procedure: LEFT HEART CATH AND CORS/GRAFTS ANGIOGRAPHY;  Surgeon: Troy Sine, MD;  Location: [place] CV LAB;  Service: Cardiovascular;  Laterality: N/A;   PACEMAKER IMPLANT N/A  06/26/2020   Procedure: PACEMAKER IMPLANT;  Surgeon: Evans Lance, MD;  Location: [place] CV LAB;  Service: Cardiovascular;  Laterality: N/A;   RADICAL NECK DISSECTION Right 12/11/2020   Procedure: RIGHT MODIFIED NECK DISSECTION;  Surgeon: Izora Gala, MD;  Location: [place];  Service: ENT;  Laterality: Right;   SUPRAVENTRICULAR TACHYCARDIA ABLATION N/A 09/03/2012   Procedure: SUPRAVENTRICULAR TACHYCARDIA ABLATION;  Surgeon: Evans Lance, MD;  Location: [facility] CATH LAB;  Service: Cardiovascular;  Laterality: N/A;   TONSILLECTOMY Bilateral 12/11/2020   Procedure: BILATERAL TONSILLECTOMY;  Surgeon: Izora Gala, MD;  Location: [place];  Service: ENT;  Laterality: Bilateral;   VIDEO BRONCHOSCOPY  01/08/2021   Procedure: VIDEO BRONCHOSCOPY WITHOUT FLUORO;  Surgeon: [name], MD;  [facility];  Service: Pulmonary;;    Allergies: Patient has no known allergies.  Medications: Prior to Admission medications   Medication Sig Start Date End Date Taking? Authorizing Provider  aspirin 81 MG chewable tablet Chew 1 tablet (81 mg total) by mouth daily. 06/28/19  Yes Minus Breeding, MD  HYDROcodone-acetaminophen (HYCET) 7.5-325 mg/15 ml solution Take 15 mLs by mouth every 6 (six) hours as needed for moderate pain. 12/13/20  Yes Izora Gala, MD  metoprolol succinate (TOPROL-XL) 50 MG 24 hr tablet Take 50 mg by mouth daily. Take with or immediately following a meal.   Yes [provider]  omeprazole (PRILOSEC) 40 MG capsule Take 40 mg by mouth daily. 11/27/20  Yes [provider]  ondansetron (ZOFRAN) 4 MG tablet Take 1 tablet (4 mg total) by  mouth every 8 (eight) hours as needed for nausea or vomiting. 01/30/21  Yes Gery Pray, MD  OVER THE COUNTER MEDICATION Take 2 tablets by mouth at bedtime as needed (sleep). Relaxium Sleep   Yes [provider]  phenol (CHLORASEPTIC) 1.4 % LIQD Use as directed 1 spray in the mouth or throat as needed for throat irritation  / pain.   Yes [provider]  rosuvastatin (CRESTOR) 40 MG tablet Take 40 mg by mouth daily.   Yes [provider]  Wound Dressings (SONAFINE EX) Apply 1 application topically daily as needed (scar care).   Yes [provider]  lidocaine (XYLOCAINE) 2 % solution Patient: Mix 1part 2% viscous lidocaine, 1part H20. Swish & swallow 75mL of diluted mixture, 63min before meals and at bedtime, up to QID prn soreness. Patient not taking: Reported on 02/01/2021 01/22/21   Eppie Gibson, MD  sildenafil (VIAGRA) 50 MG tablet Take 1 tablet (50 mg total) by mouth daily as needed for erectile dysfunction. Patient not taking: Reported on 02/01/2021 08/03/20   Minus Breeding, MD     Family History  Problem Relation Age of Onset   Liver cancer Father    Cancer Mother        bladder cancer   Colon cancer Brother 86    Social History   Socioeconomic History   Marital status: Married    Spouse name: Not on file   Number of children: Not on file   Years of education: Not on file   Highest education level: Not on file  Occupational History   Not on file  Tobacco Use   Smoking status: Never   Smokeless tobacco: Never  Vaping Use   Vaping Use: Never used  Substance and Sexual Activity   Alcohol use: Yes    Comment: 2-3 glasses wine/day   Drug use: No   Sexual activity: Not on file  Other Topics Concern   Not on file  Social History Narrative   Right handed   Two story home   Drinks caffeine   Social Determinants of Health   Financial Resource Strain: Low Risk    Difficulty of Paying Living Expenses: Not hard at all  Food Insecurity: No Food Insecurity   Worried About Charity fundraiser in the Last Year: Never true   [place] in the Last Year: Never true  Transportation Needs: No Transportation Needs   Lack of Transportation (Medical): No   Lack of Transportation (Non-Medical): No  Physical Activity: Not on file  Stress: No Stress Concern Present    Feeling of Stress : Not at all  Social Connections: Unknown   Frequency of Communication with Friends and Family: More than three times a week   Frequency of Social Gatherings with Friends and Family: More than three times a week   Attends Religious Services: More than 4 times per year   Active Member of [employer] or Organizations: Not on file   Attends Archivist Meetings: Not on file   Marital Status: Married    Review of Systems: A 12 point ROS discussed and pertinent positives are indicated in the HPI above.  All other systems are negative.  Review of Systems  Constitutional:  Positive for activity change and unexpected weight change. Negative for fatigue.  Respiratory:  Negative for cough and shortness of breath.   Cardiovascular:  Negative for chest pain.  Gastrointestinal:  Negative for abdominal pain.  Neurological:  Positive for weakness.  Psychiatric/Behavioral:  Negative for behavioral problems and confusion.    Vital Signs: BP 122/62 (BP Location: Right Arm)    Pulse 80    Temp 98.1 F (36.7 C) (Oral)    Ht 6' (1.829 m)    Wt 172 lb (78 kg)    SpO2 97%    BMI 23.33 kg/m   Physical Exam Vitals reviewed.  HENT:     Mouth/Throat:     Mouth: Mucous membranes are moist.  Cardiovascular:     Rate and Rhythm: Normal rate and regular rhythm.     Heart sounds: Normal heart sounds.  Pulmonary:     Effort: Pulmonary effort is normal.     Breath sounds: Normal breath sounds.  Abdominal:     Palpations: Abdomen is soft.  Musculoskeletal:        General: Normal range of motion.  Skin:    General: Skin is warm.  Neurological:     Mental Status: He is alert and oriented to person, place, and time.  Psychiatric:        Behavior: Behavior normal.    Imaging: No results found.  Labs:  CBC: Recent Labs    04/25/20 0857 06/13/20 0913 12/07/20 1037 02/06/21 0832  WBC 6.5 6.8 7.0 5.7  HGB 13.8 15.1 13.4 13.5  HCT 40.4 43.0 41.2 39.4  PLT 248 264 219 179     COAGS: Recent Labs    02/06/21 0832  INR 1.0    BMP: Recent Labs    04/25/20 0857 06/13/20 0913 12/07/20 1037 01/09/21 1021  NA 138 140 140  --   K 4.5 5.0 4.9  --   CL 103 103 107  --   CO2 20 24 27   --   GLUCOSE 100* 99 116*  --   BUN 18 16 25* 14  CALCIUM 9.4 10.0 9.5  --   CREATININE 1.02 1.12 1.24 1.13  GFRNONAA  --   --  >60 >60    LIVER FUNCTION TESTS: Recent Labs    04/25/20 0857  BILITOT 0.4  AST 21  ALT 20  ALKPHOS 60  PROT 6.8  ALBUMIN 4.2    TUMOR MARKERS: No results for input(s): AFPTM, CEA, CA199, CHROMGRNA in the last 8760 hours.  Assessment and Plan:  Tongue cancer Dysphagia Wt loss Needs for feeding supplement Scheduled for percutaneous gastric tube placement Risks and benefits image guided gastrostomy tube placement was discussed with the patient including, but not limited to the need for a barium enema during the procedure, bleeding, infection, peritonitis and/or damage to adjacent structures.  All of the patient's questions were answered, patient is agreeable to proceed.  Consent signed and in chart.   Thank you for this interesting consult.  I greatly enjoyed meeting Steven Mathews and look forward to participating in their care.  A copy of this report was sent to the requesting provider on this date.  Electronically Signed: Lavonia Drafts, PA-C 02/06/2021, 8:55 AM   I spent a total of  30 Minutes   in face to face in clinical consultation, greater than 50% of which was counseling/coordinating care for percutaneous gastric tube placmeent

## 2021-02-06 NOTE — Progress Notes (Signed)
Nutrition follow-up completed with patient and wife after PEG placement.  Patient receiving radiation therapy for malignant base of tongue cancer and followed by Dr. Isidore Moos.  Last weight documented was 172 pounds on January 3.  This is decreased from 197 pounds November 7.  This is a 13% weight loss over 2 months which is significant.  Labs include potassium 4.4, glucose 93, magnesium 2.0.  Phosphorus is still pending.  Patient status post PEG placement today. Patient eating very little but tries to drink at least 1 oral nutrition supplement daily.  He has also tolerated some warm tea and some broth. Reports ongoing mild nausea.  States he has some nausea medication at home but will need a refill.  Wife was present during tube feeding education.  Estimated nutrition needs: 2200-2400 cal, 100-120 g protein, greater than 2.2 L fluid.  Nutrition diagnosis: Inadequate oral intake continues.  Intervention: Patient at risk for refeeding syndrome secondary to very poor oral intake and percentage of weight loss. Patient will begin with 1 carton of [facility] [employer] 1.4 tomorrow.  Increase by 1 carton on Thursday and Friday for a total of 3 cartons a day by Friday.  He will hold tube feeding at 3 cartons on Friday, Saturday, Sunday, and Monday.  Additional labs will be drawn and as long as there is no refeeding syndrome noted patient will increase to 4 cartons on Tuesday, January 10 and Wednesday, January 11.  He will have labs drawn again on Thursday, January 12 and as long as he is tolerating he will increase to goal rate of 5 cartons per day.    [employer] 1.4 to be given every 3 hours beginning at 8 AM.  He will use 120 mL of free water before and after bolus feedings. This provides 2275 cal, 100 g protein, 2370 mL free water.  This meets 100% estimated nutrition needs.  Patient encouraged to continue liquids and soft solids as tolerated.  Enforced importance of swallowing exercises.  Written information  provided and adapt health notified.  Questions were answered and teach back method used.  Monitoring, evaluation, goals: Patient will tolerate tube feeding advancement without showing refeeding syndrome to achieve weight stability and adequate healing.  Next visit: Tuesday, January 10.  **Disclaimer: This note was dictated with voice recognition software. Similar sounding words can inadvertently be transcribed and this note may contain transcription errors which may not have been corrected upon publication of note.**

## 2021-02-06 NOTE — Procedures (Signed)
Interventional Radiology Procedure Note  Procedure: Gastrostomy tube placement  Complications: None  Estimated Blood Loss: < 10 mL  Findings: 20 Fr bumper retention gastrostomy tube placed with tip in body of stomach. OK to use tomorrow.  Venetia Night. Kathlene Cote, M.D Pager:  [phone]

## 2021-02-07 ENCOUNTER — Other Ambulatory Visit: Payer: Self-pay

## 2021-02-07 ENCOUNTER — Telehealth: Payer: Self-pay | Admitting: Student

## 2021-02-07 ENCOUNTER — Ambulatory Visit
Admission: RE | Admit: 2021-02-07 | Discharge: 2021-02-07 | Disposition: A | Discharge status: Home / Self Care | Payer: Medicare Other | Source: Ambulatory Visit | Attending: Radiation Oncology | Admitting: Radiation Oncology

## 2021-02-07 DIAGNOSIS — Z510 Encounter for antineoplastic radiation therapy: Secondary | ICD-10-CM | POA: Diagnosis not present

## 2021-02-08 ENCOUNTER — Ambulatory Visit
Admission: RE | Admit: 2021-02-08 | Discharge: 2021-02-08 | Disposition: A | Discharge status: Home / Self Care | Payer: Medicare Other | Source: Ambulatory Visit | Attending: Radiation Oncology | Admitting: Radiation Oncology

## 2021-02-08 DIAGNOSIS — Z510 Encounter for antineoplastic radiation therapy: Secondary | ICD-10-CM | POA: Diagnosis not present

## 2021-02-08 NOTE — Telephone Encounter (Signed)
Called patient but he did not answer. Left message for him to call back. Per the PCCs, the CPT codes for the EBUS are 31652 and [phone].

## 2021-02-09 ENCOUNTER — Other Ambulatory Visit: Payer: Self-pay

## 2021-02-09 ENCOUNTER — Ambulatory Visit
Admission: RE | Admit: 2021-02-09 | Discharge: 2021-02-09 | Disposition: A | Discharge status: Home / Self Care | Payer: Medicare Other | Source: Ambulatory Visit | Attending: Radiation Oncology | Admitting: Radiation Oncology

## 2021-02-09 DIAGNOSIS — Z510 Encounter for antineoplastic radiation therapy: Secondary | ICD-10-CM | POA: Diagnosis not present

## 2021-02-12 ENCOUNTER — Ambulatory Visit
Admission: RE | Admit: 2021-02-12 | Discharge: 2021-02-12 | Disposition: A | Discharge status: Home / Self Care | Payer: Medicare Other | Source: Ambulatory Visit | Attending: Radiation Oncology | Admitting: Radiation Oncology

## 2021-02-12 ENCOUNTER — Other Ambulatory Visit: Payer: Self-pay

## 2021-02-12 DIAGNOSIS — Z510 Encounter for antineoplastic radiation therapy: Secondary | ICD-10-CM | POA: Diagnosis not present

## 2021-02-12 DIAGNOSIS — R638 Other symptoms and signs concerning food and fluid intake: Secondary | ICD-10-CM

## 2021-02-12 DIAGNOSIS — C770 Secondary and unspecified malignant neoplasm of lymph nodes of head, face and neck: Secondary | ICD-10-CM

## 2021-02-12 LAB — CMP (CANCER CENTER ONLY)
ALT: 10 U/L (ref 0–44)
AST: 15 U/L (ref 15–41)
Albumin: 4.1 g/dL (ref 3.5–5.0)
Alkaline Phosphatase: 49 U/L (ref 38–126)
Anion gap: 7 (ref 5–15)
BUN: 23 mg/dL (ref 8–23)
CO2: 30 mmol/L (ref 22–32)
Calcium: 10.3 mg/dL (ref 8.9–10.3)
Chloride: 103 mmol/L (ref 98–111)
Creatinine: 1.21 mg/dL (ref 0.61–1.24)
GFR, Estimated: >60 mL/min (ref >60)
Glucose, Bld: 101 mg/dL — ABNORMAL HIGH (ref 70–99)
Potassium: 4.8 mmol/L (ref 3.5–5.1)
Sodium: 140 mmol/L (ref 135–145)
Total Bilirubin: 0.4 mg/dL (ref 0.3–1.2)
Total Protein: 7.4 g/dL (ref 6.5–8.1)

## 2021-02-12 LAB — MAGNESIUM: Magnesium: 2.1 mg/dL (ref 1.7–2.4)

## 2021-02-12 LAB — PHOSPHORUS: Phosphorus: 4.9 mg/dL — ABNORMAL HIGH (ref 2.5–4.6)

## 2021-02-13 ENCOUNTER — Inpatient Hospital Stay: Payer: Medicare (Managed Care) | Admitting: Nutrition

## 2021-02-13 ENCOUNTER — Ambulatory Visit
Admission: RE | Admit: 2021-02-13 | Discharge: 2021-02-13 | Disposition: A | Discharge status: Home / Self Care | Payer: Medicare Other | Source: Ambulatory Visit | Attending: Radiation Oncology | Admitting: Radiation Oncology

## 2021-02-13 ENCOUNTER — Other Ambulatory Visit (HOSPITAL_COMMUNITY): Payer: Medicare (Managed Care) | Admitting: Dentistry

## 2021-02-13 DIAGNOSIS — Z510 Encounter for antineoplastic radiation therapy: Secondary | ICD-10-CM | POA: Diagnosis not present

## 2021-02-13 NOTE — Progress Notes (Signed)
Nutrition follow-up completed with patient receiving radiation therapy for malignant base of tongue cancer and followed by Dr. Isidore Moos.  PEG placed January 3.  Weight documented as 171.2 pounds today down slightly from 172 pounds January 3.  Noted labs: Glucose 101, potassium 4.8, phosphorus 4.9, magnesium 2.1.  Patient denies diarrhea and constipation. Reports he tolerated 1 carton of [employer] 1.4 on January 4.  When he increased to 2 cartons on January 5 he had vomiting after 1 carton.  This appears to be an isolated incidence.  Patient was able to give 1-1/2 cartons on January 6 and 7 and increase to 2 cartons on January 8 and 9.  Patient appears to be infusing formula correctly.  Reports taking nausea medications as directed.  No refeeding syndrome noted so safe for patient to continue to increase formula.  Estimated nutrition needs: 2200-2400 cal, 100-120 g protein, greater than 2.2 L fluid.  Nutrition diagnosis: Inadequate oral intake continues.  Intervention: Patient educated to increase to 3 cartons of [employer] 1.4 today and continue through tomorrow.  If tolerated patient will advance to 4 cartons on Thursday and Friday and follow-up with 5 cartons beginning Saturday.  Continue 120 mL free water before and after bolus feedings.  This provides 2275 cal, 100 g protein, 2370 mL free water. Continue p.o. liquids and solids as tolerated.  Monitoring, evaluation, goals: Patient will tolerate tube feeding advancement without refeeding syndrome to achieve weight stability and adequate healing.  Next visit: Monday, January 16.  **Disclaimer: This note was dictated with voice recognition software. Similar sounding words can inadvertently be transcribed and this note may contain transcription errors which may not have been corrected upon publication of note.**

## 2021-02-14 ENCOUNTER — Other Ambulatory Visit: Payer: Self-pay

## 2021-02-14 ENCOUNTER — Ambulatory Visit (INDEPENDENT_AMBULATORY_CARE_PROVIDER_SITE_OTHER): Payer: Medicare Other | Admitting: Dentistry

## 2021-02-14 ENCOUNTER — Ambulatory Visit
Admission: RE | Admit: 2021-02-14 | Discharge: 2021-02-14 | Disposition: A | Discharge status: Home / Self Care | Payer: Medicare Other | Source: Ambulatory Visit | Attending: Radiation Oncology | Admitting: Radiation Oncology

## 2021-02-14 VITALS — BP 109/62 | HR 73

## 2021-02-14 DIAGNOSIS — C01 Malignant neoplasm of base of tongue: Secondary | ICD-10-CM

## 2021-02-14 DIAGNOSIS — K117 Disturbances of salivary secretion: Secondary | ICD-10-CM | POA: Diagnosis not present

## 2021-02-14 DIAGNOSIS — Z510 Encounter for antineoplastic radiation therapy: Secondary | ICD-10-CM | POA: Diagnosis not present

## 2021-02-14 NOTE — Progress Notes (Signed)
Department of Dental Medicine   Service Date:   02/14/2021  Patient Name:   Steven Mathews Date of Birth:   08/18/1945 Medical Record Number: [id number]   TODAY'S VISIT: FOLLOW-UP APPOINTMENT   ASSESSMENT: There are no signs of acute or chronic infection associated with #1 extraction site.  The site continues to heal well & consistent with dental procedures performed. Clinical xerostomia  RECOMMENDATIONS: Use CLoSYS as needed for dry mouth. Continue salt water & baking soda rinses as needed. Take multiple sips of water as needed.  PLAN: Follow-up after the completion of radiation. Call if any questions or concerns arise.  Discussed in detail all treatment options and recommendations with the patient and they are agreeable to the plan.    02/14/2021 PROGRESS NOTE:  COVID-19 SCREENING:  The patient denies symptoms concerning for COVID-19 infection including fever, chills, cough, or newly developed shortness of breath.   HISTORY OF PRESENT ILLNESS: Steven Mathews presents today for a follow-up visit to examine extraction site of tooth #1.   Medical and dental history reviewed with the patient.  He has started radiation therapy and is ~2 weeks into treatment.   CHIEF COMPLAINT:   The patient endorses severe foul taste in mouth and is concerned about infection related to extraction of tooth #1 a few months ago which was completed at an outside dental office.   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   Bradycardia 08/01/2020   Heart block 06/13/2020   Therapeutic drug monitoring 09/16/2018   Rash 05/25/2018   Essential hypertension 05/25/2018   Educated about COVID-19 virus infection 05/25/2018   Acute ST elevation myocardial infarction (STEMI) due to occlusion of right coronary artery ([place]) 03/13/2018   Hx of CABG    Hx of adenomatous colonic polyps 04/04/2016   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 04/13/2013   SVT (supraventricular tachycardia) ([place]) 09/03/2012   ED (erectile dysfunction) 10/05/2010   PALPITATIONS 08/29/2008   BRONCHITIS, ACUTE 06/24/2008   HYPERLIPIDEMIA 04/14/2008   Acute ST elevation myocardial infarction (STEMI) ([place]) 04/14/2008   PULMONARY INFILTRATE INCLUDES (EOSINOPHILIA) 04/14/2008   Pure hypercholesterolemia 04/14/2008   Past Medical History:  Diagnosis Date   Atrial fibrillation ([facility])    CAD (coronary artery disease)    CABG 2008; 03/13/2018 NSTEMI due to acute thrombotic occlusion of RCA graft, patent LIMA to LAD.   Chronic cough    GERD (gastroesophageal reflux disease)    Hx of adenomatous colonic polyps 04/04/2016   Hyperlipidemia    Hypertension    Myocardial infarct [facility])    Presence of permanent cardiac pacemaker    SVT (supraventricular tachycardia) ([place])    Past Surgical History:  Procedure Laterality Date   CORONARY ARTERY BYPASS GRAFT  03/2006   DIRECT LARYNGOSCOPY N/A 12/11/2020   Procedure: DIRECT LARYNGOSCOPY WITH BIOPSY;  Surgeon: Izora Gala, MD;  Location: [place];  Service: ENT;  Laterality: N/A;   ENDOBRONCHIAL ULTRASOUND N/A 01/08/2021   Procedure: ENDOBRONCHIAL ULTRASOUND;  Surgeon: [name], MD;  Location: [facility];  Service: Pulmonary;  Laterality: N/A;   ESOPHAGOSCOPY N/A 12/11/2020   Procedure: ESOPHAGOSCOPY;  Surgeon: Izora Gala, MD;  Location: [place];  Service: ENT;  Laterality: N/A;   FINE NEEDLE ASPIRATION   01/08/2021   Procedure: FINE NEEDLE ASPIRATION;  Surgeon: [name], MD;  [facility];  Service: Pulmonary;;   HERNIA REPAIR  1964   IR GASTROSTOMY TUBE MOD SED  02/06/2021   LEFT HEART CATH AND CORS/GRAFTS ANGIOGRAPHY N/A 03/13/2018   Procedure: LEFT HEART CATH AND CORS/GRAFTS ANGIOGRAPHY;  Surgeon: Troy Sine, MD;  Location: [place] CV LAB;  Service: Cardiovascular;  Laterality: N/A;   PACEMAKER IMPLANT N/A 06/26/2020   Procedure: PACEMAKER IMPLANT;  Surgeon: Evans Lance, MD;  Location: [place] CV LAB;  Service: Cardiovascular;  Laterality: N/A;   RADICAL NECK DISSECTION Right 12/11/2020   Procedure: RIGHT MODIFIED NECK DISSECTION;  Surgeon: Izora Gala, MD;  Location: [place];  Service: ENT;  Laterality: Right;   SUPRAVENTRICULAR TACHYCARDIA ABLATION N/A 09/03/2012   Procedure: SUPRAVENTRICULAR TACHYCARDIA ABLATION;  Surgeon: Evans Lance, MD;  Location: [facility] C[facility] LAB;  Service: Cardiovascular;  Laterality: N/A;   TONSILLECTOMY Bilateral 12/11/2020   Procedure: BILATERAL TONSILLECTOMY;  Surgeon: Izora Gala, MD;  Location: [place];  Service: ENT;  Laterality: Bilateral;   VIDEO BRONCHOSCOPY  01/08/2021   Procedure: VIDEO BRONCHOSCOPY WITHOUT FLUORO;  Surgeon: [name], MD;  [facility];  Service: Pulmonary;;   Current Outpatient Medications  Medication Sig Dispense Refill   aspirin 81 MG chewable tablet Chew 1 tablet (81 mg total) by mouth daily. 90 tablet 3   HYDROcodone-acetaminophen (NORCO) 7.5-325 MG tablet Take 1 tablet by mouth every 4 (four) hours as needed for moderate pain. Take with food. 60 tablet 0   lidocaine (XYLOCAINE) 2 % solution Patient: Mix 1part 2% viscous lidocaine, 1part H20. Swish & swallow 14m of diluted mixture, 368m before meals and at bedtime, up to QID prn soreness. (Patient not taking: Reported on 02/01/2021) 200 mL 3   metoprolol succinate (TOPROL-XL) 50 MG 24 hr tablet Take 50 mg by mouth daily. Take with or  immediately following a meal.     Nutritional Supplements ([employer]ANDARD 1.4) LIQD 1 Bottle by Enteral route 5 (five) times daily. 1625 mL 6   omeprazole (PRILOSEC) 40 MG capsule Take 40 mg by mouth daily.     ondansetron (ZOFRAN) 8 MG tablet Take 1 tablet (8 mg total) by mouth every 8 (eight) hours as needed for nausea or vomiting. 30 tablet 2   OVER THE COUNTER MEDICATION Take 2 tablets by mouth at bedtime as needed (sleep). Relaxium Sleep     phenol (CHLORASEPTIC) 1.4 % LIQD Use as directed 1 spray in the mouth or throat as needed for throat irritation / pain.     rosuvastatin (CRESTOR) 40 MG tablet Take 40 mg by mouth daily.     sildenafil (VIAGRA) 50 MG tablet Take 1 tablet (50 mg total) by mouth daily as needed for erectile dysfunction. (Patient not taking: Reported on 02/01/2021) 20 tablet 3   Wound Dressings (SONAFINE EX) Apply 1 application topically daily as needed (scar care).     No current facility-administered medications for this visit.   No Known Allergies  LABS: Lab Results  Component Value Date   WBC 5.7 02/06/2021   HGB 13.5 02/06/2021   HCT 39.4 02/06/2021   MCV 92.7 02/06/2021   PLT 179 02/06/2021  BMET    Component Value Date/Time   NA 140 02/12/2021 1021   NA 140 06/13/2020 0913   K 4.8 02/12/2021 1021   CL 103 02/12/2021 1021   CO2 30 02/12/2021 1021   GLUCOSE 101 (H) 02/12/2021 1021   BUN 23 02/12/2021 1021   BUN 16 06/13/2020 0913   CREATININE 1.21 02/12/2021 1021   CALCIUM 10.3 02/12/2021 1021   EGFR 69 06/13/2020 0913   GFRNONAA >60 02/12/2021 1021    Lab Results  Component Value Date   INR 1.0 02/06/2021   INR 1.09 03/13/2018   No results found for: PTT   VITALS: BP 109/62 (BP Location: Left Arm, Patient Position: Sitting, Cuff Size: Normal)    Pulse 73    EXAM: #1 extraction site appear to be healing WNL.  No signs of wound dehiscence or infection evident upon examination. Foamy appearance of saliva coating hard and soft  palate and tongue.   RADIOGRAPHIC EXAM: PAN exposed and interpreted.    Condyles seated bilaterally in fossas.  No evidence of abnormal pathology.  All visualized osseous structures appear WNL. Missing tooth #1.     No remarkable findings or changes from PAN interpreted on 01/02/21.   ASSESSMENT:   Postoperative course is consistent with dental procedures performed. Xerostomia   PLAN AND RECOMMENDATIONS: Follow-up after the completion of radiation therapy.   Try using CLoSYS OTC brand to help with dry mouth.  Samples and coupons were given to the patient today. Continue salt water/baking soda rinses as needed.  Take multiple sips of water as needed. Call if any questions or concerns arise.  All questions and concerns were invited and addressed.  The patient tolerated today's visit well and departed in stable condition.  Laughlin Benson [place], D.M.D.

## 2021-02-15 ENCOUNTER — Ambulatory Visit
Admission: RE | Admit: 2021-02-15 | Discharge: 2021-02-15 | Disposition: A | Discharge status: Home / Self Care | Payer: Medicare Other | Source: Ambulatory Visit | Attending: Radiation Oncology | Admitting: Radiation Oncology

## 2021-02-15 DIAGNOSIS — Z510 Encounter for antineoplastic radiation therapy: Secondary | ICD-10-CM | POA: Diagnosis not present

## 2021-02-15 DIAGNOSIS — C770 Secondary and unspecified malignant neoplasm of lymph nodes of head, face and neck: Secondary | ICD-10-CM

## 2021-02-15 DIAGNOSIS — R638 Other symptoms and signs concerning food and fluid intake: Secondary | ICD-10-CM

## 2021-02-15 LAB — CMP (CANCER CENTER ONLY)
ALT: 10 U/L (ref 0–44)
AST: 13 U/L — ABNORMAL LOW (ref 15–41)
Albumin: 4.1 g/dL (ref 3.5–5.0)
Alkaline Phosphatase: 47 U/L (ref 38–126)
Anion gap: 7 (ref 5–15)
BUN: 22 mg/dL (ref 8–23)
CO2: 29 mmol/L (ref 22–32)
Calcium: 9.9 mg/dL (ref 8.9–10.3)
Chloride: 104 mmol/L (ref 98–111)
Creatinine: 1.24 mg/dL (ref 0.61–1.24)
GFR, Estimated: >60 mL/min (ref >60)
Glucose, Bld: 104 mg/dL — ABNORMAL HIGH (ref 70–99)
Potassium: 4.4 mmol/L (ref 3.5–5.1)
Sodium: 140 mmol/L (ref 135–145)
Total Bilirubin: 0.4 mg/dL (ref 0.3–1.2)
Total Protein: 7.2 g/dL (ref 6.5–8.1)

## 2021-02-15 LAB — MAGNESIUM: Magnesium: 2.2 mg/dL (ref 1.7–2.4)

## 2021-02-15 LAB — PHOSPHORUS: Phosphorus: 4.8 mg/dL — ABNORMAL HIGH (ref 2.5–4.6)

## 2021-02-16 ENCOUNTER — Other Ambulatory Visit: Payer: Self-pay

## 2021-02-16 ENCOUNTER — Telehealth: Payer: Self-pay | Admitting: Student

## 2021-02-16 ENCOUNTER — Ambulatory Visit
Admission: RE | Admit: 2021-02-16 | Discharge: 2021-02-16 | Disposition: A | Discharge status: Home / Self Care | Payer: Medicare Other | Source: Ambulatory Visit | Attending: Radiation Oncology | Admitting: Radiation Oncology

## 2021-02-16 DIAGNOSIS — K117 Disturbances of salivary secretion: Secondary | ICD-10-CM | POA: Insufficient documentation

## 2021-02-16 DIAGNOSIS — Z510 Encounter for antineoplastic radiation therapy: Secondary | ICD-10-CM | POA: Diagnosis not present

## 2021-02-16 NOTE — Telephone Encounter (Signed)
See encounter from 02/07/21.

## 2021-02-19 ENCOUNTER — Inpatient Hospital Stay: Payer: Medicare (Managed Care) | Admitting: Nutrition

## 2021-02-19 ENCOUNTER — Ambulatory Visit
Admission: RE | Admit: 2021-02-19 | Discharge: 2021-02-19 | Disposition: A | Discharge status: Home / Self Care | Payer: Medicare Other | Source: Ambulatory Visit | Attending: Radiation Oncology | Admitting: Radiation Oncology

## 2021-02-19 ENCOUNTER — Other Ambulatory Visit: Payer: Self-pay

## 2021-02-19 DIAGNOSIS — Z510 Encounter for antineoplastic radiation therapy: Secondary | ICD-10-CM | POA: Diagnosis not present

## 2021-02-19 NOTE — Progress Notes (Signed)
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  Patient is followed by Dr. Isidore Moos.  PEG placed on January 3.  Weight documented as 172.6 pounds today.  This is increased from 171.2 pounds last week. Weight has been stable for 2 weeks.  Patient states he is not taking any food by mouth but he is trying to sip on water and diet ginger ale. He has occasional soft stools. Denies diarrhea/constipation. Reports some nausea however denies vomiting. Positive for xerostomia. Reports he is now giving 5 cartons of [employer] 1.4 via PEG with 60 mL free water before and after bolus feedings.  He denies tolerance issues.  He has a desire to reduce total feedings to 4 daily instead of 5.  5 cartons [employer] 1.4 provides 2275 cal and 100 g of protein.  Estimated nutrition needs: 2200-2400 cal, 100-120 g protein, greater than 2.2 L fluid.  Nutrition diagnosis: Inadequate oral intake continues.  Intervention: Recommended patient continue a total of 5 cartons [employer] 1.4 daily.  Educated patient to add an additional 3 ounces [employer] 1.4 to each feeding to make up the fifth carton.  Increase free water flushes to 120 mL before and after bolus feedings.   Continue other liquids by mouth. Will need an additional 240 mL water by mouth.  Monitoring, evaluation, goals: Patient will continue [facility] 1.4, 5 cartons daily, with 120 mL free water before and after bolus feedings and an additional 240 mL free water by mouth.  Next visit: Tuesday, January 24 after radiation therapy.  **Disclaimer: This note was dictated with voice recognition software. Similar sounding words can inadvertently be transcribed and this note may contain transcription errors which may not have been corrected upon publication of note.**

## 2021-02-20 ENCOUNTER — Ambulatory Visit
Admission: RE | Admit: 2021-02-20 | Discharge: 2021-02-20 | Disposition: A | Discharge status: Home / Self Care | Payer: Medicare Other | Source: Ambulatory Visit | Attending: Radiation Oncology | Admitting: Radiation Oncology

## 2021-02-20 DIAGNOSIS — Z510 Encounter for antineoplastic radiation therapy: Secondary | ICD-10-CM | POA: Diagnosis not present

## 2021-02-21 ENCOUNTER — Other Ambulatory Visit: Payer: Self-pay

## 2021-02-21 ENCOUNTER — Ambulatory Visit
Admission: RE | Admit: 2021-02-21 | Discharge: 2021-02-21 | Disposition: A | Discharge status: Home / Self Care | Payer: Medicare Other | Source: Ambulatory Visit | Attending: Radiation Oncology | Admitting: Radiation Oncology

## 2021-02-21 DIAGNOSIS — Z510 Encounter for antineoplastic radiation therapy: Secondary | ICD-10-CM | POA: Diagnosis not present

## 2021-02-22 ENCOUNTER — Ambulatory Visit: Payer: Medicare Other | Attending: Radiation Oncology

## 2021-02-22 ENCOUNTER — Ambulatory Visit
Admission: RE | Admit: 2021-02-22 | Discharge: 2021-02-22 | Disposition: A | Discharge status: Home / Self Care | Payer: Medicare Other | Source: Ambulatory Visit | Attending: Radiation Oncology | Admitting: Radiation Oncology

## 2021-02-22 DIAGNOSIS — R1310 Dysphagia, unspecified: Secondary | ICD-10-CM | POA: Diagnosis present

## 2021-02-22 DIAGNOSIS — Z510 Encounter for antineoplastic radiation therapy: Secondary | ICD-10-CM | POA: Diagnosis not present

## 2021-02-22 NOTE — Therapy (Signed)
[facility] [address], [place], [place], [address] Phone: [phone]   Fax:  [phone]  Speech Language Pathology Treatment  Patient Details  Name: Steven Mathews MRN: [id number] Date of Birth: 1945-12-12 Referring Provider (SLP): Eppie Gibson, MD   Encounter Date: 02/22/2021   End of Session - 02/22/21 1556     Visit Number 2    Number of Visits 4    Date for SLP Re-Evaluation 04/18/21    SLP Start Time 7341    SLP Stop Time  1519    SLP Time Calculation (min) 34 min    Activity Tolerance Patient tolerated treatment well             Past Medical History:  Diagnosis Date   Atrial fibrillation ([place])    CAD (coronary artery disease)    CABG 2008; 03/13/2018 NSTEMI due to acute thrombotic occlusion of RCA graft, patent LIMA to LAD.   Chronic cough    GERD (gastroesophageal reflux disease)    Hx of adenomatous colonic polyps 04/04/2016   Hyperlipidemia    Hypertension    Myocardial infarct [facility])    Presence of permanent cardiac pacemaker    SVT (supraventricular tachycardia) ([place])     Past Surgical History:  Procedure Laterality Date   CORONARY ARTERY BYPASS GRAFT  03/2006   DIRECT LARYNGOSCOPY N/A 12/11/2020   Procedure: DIRECT LARYNGOSCOPY WITH BIOPSY;  Surgeon: Izora Gala, MD;  Location: [place];  Service: ENT;  Laterality: N/A;   ENDOBRONCHIAL ULTRASOUND N/A 01/08/2021   Procedure: ENDOBRONCHIAL ULTRASOUND;  Surgeon: [name], MD;  Location: [facility];  Service: Pulmonary;  Laterality: N/A;   ESOPHAGOSCOPY N/A 12/11/2020   Procedure: ESOPHAGOSCOPY;  Surgeon: Izora Gala, MD;  Location: [place];  Service: ENT;  Laterality: N/A;   FINE NEEDLE ASPIRATION  01/08/2021   Procedure: FINE NEEDLE ASPIRATION;  Surgeon: [name], MD;  Location: [facility];  Service: Pulmonary;;   HERNIA REPAIR  1964   IR GASTROSTOMY TUBE MOD SED  02/06/2021   LEFT HEART CATH AND CORS/GRAFTS ANGIOGRAPHY N/A 03/13/2018    Procedure: LEFT HEART CATH AND CORS/GRAFTS ANGIOGRAPHY;  Surgeon: Troy Sine, MD;  Location: [place] [facility];  Service: Cardiovascular;  Laterality: N/A;   PACEMAKER IMPLANT N/A 06/26/2020   Procedure: PACEMAKER IMPLANT;  Surgeon: Evans Lance, MD;  Location: [place] [facility];  Service: Cardiovascular;  Laterality: N/A;   RADICAL NECK DISSECTION Right 12/11/2020   Procedure: RIGHT MODIFIED NECK DISSECTION;  Surgeon: Izora Gala, MD;  Location: [place];  Service: ENT;  Laterality: Right;   SUPRAVENTRICULAR TACHYCARDIA ABLATION N/A 09/03/2012   Procedure: SUPRAVENTRICULAR TACHYCARDIA ABLATION;  Surgeon: Evans Lance, MD;  Location: [facility];  Service: Cardiovascular;  Laterality: N/A;   TONSILLECTOMY Bilateral 12/11/2020   Procedure: BILATERAL TONSILLECTOMY;  Surgeon: Izora Gala, MD;  Location: [facility];  Service: ENT;  Laterality: Bilateral;   VIDEO BRONCHOSCOPY  01/08/2021   Procedure: VIDEO BRONCHOSCOPY WITHOUT FLUORO;  Surgeon: [name], MD;  Location: [facility];  Service: Pulmonary;;    There were no vitals filed for this visit.   Subjective Assessment - 02/22/21 1457     Subjective Pt reports he vomited yesterday when feeding.    Currently in Pain? Yes    Pain Score 3     Pain Location Throat    Pain Orientation Mid    Pain Descriptors / Indicators Sore    Pain Type Acute pain  ADULT SLP TREATMENT - 02/22/21 0001       General Information   Behavior/Cognition Alert;Cooperative;Pleasant mood      Treatment Provided   Treatment provided Dysphagia      Dysphagia Treatment   Temperature Spikes Noted No    Respiratory Status Room air    Oral Cavity - Dentition Adequate natural dentition    Treatment Methods Compensation strategy training;Patient/caregiver education;Skilled observation;Therapeutic exercise    Patient observed directly with PO's Yes    Type of PO's observed Thin liquids    Oral Phase Signs & Symptoms --    pt tilted head back - habit when he takes meds - SLP encouraged pt to leave head neutral position with POs   Pharyngeal Phase Signs & Symptoms --   none noted   Other treatment/comments Pt performed all HEP with modified independence. Has not done much of HEP in last 2-3 weeks due to thick saliva which gags him and makes him vomit. He told SLP he could do the HEP during times when he does not have thick saliva. SLP reiterated that pt oculd also cycle throuh the HEP instead of doing 10 reps of swallowing exericses at once and moving on to the next swallowing exercise. He told SLP rationale for HEP.      Assessment / Recommendations / Plan   Plan Continue with current plan of care      Dysphagia Recommendations   Diet recommendations --   as tolerated   Medication Administration --   as tolerated     Progression Toward Goals   Progression toward goals Progressing toward goals              SLP Short Term Goals - 02/22/21                      SLP SHORT TERM GOAL #1    Title pt will complete HEP with rare min A     Time      Period --   visits, for all STGs    Status Achieved    Target Date 02/16/21            SLP SHORT TERM GOAL #2    Title pt will tell SLP why pt is completing HEP with modified independence     Time        Status Achieved    Target Date 03/16/21            SLP SHORT TERM GOAL #3    Title  Status pt will describe 3 overt s/s aspiration PNA with modified independence  Ongoing    Time 2     Target Date 04/13/21                  SLP SHORT TERM GOAL #4  Title pt will tell SLP how a food journal can facilitate quicker return to more normal POs following rad tx  Time Status 1 Ongoing  Target Date 04/13/21                         SLP Long Term Goals - 02/22/21                      SLP LONG TERM GOAL #1    Title pt will complete HEP with modified independence in 2 sessions     Time 3    Period --   visits, for all  LTGs    Status Ongoing      Target Date 05/18/21            SLP LONG TERM GOAL #2    Title pt will describe how to modify HEP over time, and the timeline associated with reduction in HEP frequency with modified independence over two sessions     Time Status 4  Ongoing    Target Date 06/22/21                     Plan - 02/22/21        Clinical Impression Statement See above for description of pt's swallow status. SLP designed an individualized HEP for dysphagia and pt completed each exercise on their own with usual mod cues faded to modified independence. There are no overt s/s aspiration reported by pt at this time. Data indicate that pt's swallow ability will likely decrease over the course of radiation therapy and could very well decline over time following conclusion of their radiation therapy due to muscle disuse atrophy and/or muscle fibrosis. Pt will cont to need to be seen by SLP in order to assess safety of PO intake, assess the need for recommending any objective swallow assessment, and ensuring pt correctly completes the individualized HEP.     Speech Therapy Frequency --   once approx every 4 weeks    Duration --   90 days    Treatment/Interventions Aspiration precaution training;Pharyngeal strengthening exercises;Diet toleration management by SLP;Compensatory techniques;SLP instruction and feedback;Patient/family education;Compensatory strategies     Potential to Achieve Goals Good     SLP Home Exercise Plan provided today     Consulted and Agree with Plan of Care Patient             Patient will benefit from skilled therapeutic intervention in order to improve the following deficits and impairments:   Dysphagia, unspecified type    Problem List Patient Active Problem List   Diagnosis Date Noted   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   Encounter for fitting or adjustment of dental prosthetic device 01/10/2021   Hilar lymphadenopathy    Encounter for preoperative dental examination 01/02/2021    Tooth missing 01/02/2021   Gingival recession, generalized 01/02/2021   Defective dental restoration 01/02/2021   Dental attrition, excessive 01/02/2021   Diastema 01/02/2021   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   Malignant neoplasm of base of tongue ([place]) 12/27/2020   Pacemaker 12/19/2020   Neck mass 12/11/2020   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   Bradycardia 08/01/2020   Heart block 06/13/2020   Therapeutic drug monitoring 09/16/2018   Rash 05/25/2018   Essential hypertension 05/25/2018   Educated about COVID-19 virus infection 05/25/2018   Acute ST elevation myocardial infarction (STEMI) due to occlusion of right coronary artery ([place]) 03/13/2018   Hx of CABG    Hx of adenomatous colonic polyps 04/04/2016   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 [place], [place], [address] Phone: [phone]   Fax:  [phone]   Name: Jakaiden Fill MRN: [id number] Date of Birth: 11-06-1945

## 2021-02-23 ENCOUNTER — Ambulatory Visit
Admission: RE | Admit: 2021-02-23 | Discharge: 2021-02-23 | Disposition: A | Discharge status: Home / Self Care | Payer: Medicare Other | Source: Ambulatory Visit | Attending: Radiation Oncology | Admitting: Radiation Oncology

## 2021-02-23 DIAGNOSIS — Z510 Encounter for antineoplastic radiation therapy: Secondary | ICD-10-CM | POA: Diagnosis not present

## 2021-02-26 ENCOUNTER — Ambulatory Visit
Admission: RE | Admit: 2021-02-26 | Discharge: 2021-02-26 | Disposition: A | Discharge status: Home / Self Care | Payer: Medicare Other | Source: Ambulatory Visit | Attending: Radiation Oncology | Admitting: Radiation Oncology

## 2021-02-26 ENCOUNTER — Other Ambulatory Visit: Payer: Self-pay | Admitting: Radiation Oncology

## 2021-02-26 ENCOUNTER — Other Ambulatory Visit: Payer: Self-pay

## 2021-02-26 DIAGNOSIS — Z510 Encounter for antineoplastic radiation therapy: Secondary | ICD-10-CM | POA: Diagnosis not present

## 2021-02-26 DIAGNOSIS — C01 Malignant neoplasm of base of tongue: Secondary | ICD-10-CM

## 2021-02-26 MED ORDER — HYDROCODONE-ACETAMINOPHEN 7.5-325 MG PO TABS: 1.0000 | ORAL_TABLET | ORAL | 0 refills | Status: DC | PRN

## 2021-02-26 MED ORDER — ONDANSETRON HCL 8 MG PO TABS: 8.0000 mg | ORAL_TABLET | Freq: Three times a day (TID) | ORAL | 5 refills | Status: DC | PRN

## 2021-02-26 MED ORDER — SONAFINE EX EMUL
1.0000 "application " | Freq: Two times a day (BID) | CUTANEOUS | Status: DC
  Administered 2021-02-26: 1 via TOPICAL

## 2021-02-27 ENCOUNTER — Ambulatory Visit
Admission: RE | Admit: 2021-02-27 | Discharge: 2021-02-27 | Disposition: A | Discharge status: Home / Self Care | Payer: Medicare Other | Source: Ambulatory Visit | Attending: Radiation Oncology | Admitting: Radiation Oncology

## 2021-02-27 ENCOUNTER — Inpatient Hospital Stay: Payer: Medicare (Managed Care) | Admitting: Nutrition

## 2021-02-27 DIAGNOSIS — Z510 Encounter for antineoplastic radiation therapy: Secondary | ICD-10-CM | POA: Diagnosis not present

## 2021-02-27 NOTE — Progress Notes (Signed)
Nutrition follow-up completed with patient after radiation therapy.  Patient has completed 25 out of 35 treatments for tongue cancer.  He is followed by Dr. Isidore Moos.  PEG placed on January 3.  Weight decreased and documented as 168.4 pounds on January 23.  This is decreased from 172.6 pounds January 16.  Patient reports he can only tolerate 4 cartons of [employer] 1.4 via PEG a day.  He has tried to increase free water with feedings however he feels very full. Reports increased nausea with increased volume and feedings. Also reports thickened saliva increasing nausea.  MD has ordered scopolamine patch. Patient denies diarrhea and constipation. Patient reports continuing to try to drink some water by mouth.  Estimated nutrition needs: 2200-2400 cal, 100-120 g protein, greater than 2.2 L fluid.  Nutrition diagnosis: Inadequate oral intake continues.  Intervention: Educated patient to decrease free water at bolus feedings to 60 mL before and after.  Recommended try 240 mL free water flush 3 times daily between tube feeding. Try to increase [employer] 1.4 to goal rate of 5 cartons a day. Continue nausea medications as needed. Provided additional syringes at patient request.  5 cartons of [employer] 1.4 provides 2275 kcal, 100 gm pro.(~30 kcal/kg and 1.3 gm pro/kg)  Monitoring, evaluation, goals: Patient will increase tube feeding and free water flushes to meet greater than 90% estimated nutrition needs.  Next visit: Tuesday, January 31 with Vinnie Level.  **Disclaimer: This note was dictated with voice recognition software. Similar sounding words can inadvertently be transcribed and this note may contain transcription errors which may not have been corrected upon publication of note.**

## 2021-02-28 ENCOUNTER — Other Ambulatory Visit: Payer: Self-pay

## 2021-02-28 ENCOUNTER — Ambulatory Visit
Admission: RE | Admit: 2021-02-28 | Discharge: 2021-02-28 | Disposition: A | Discharge status: Home / Self Care | Payer: Medicare Other | Source: Ambulatory Visit | Attending: Radiation Oncology | Admitting: Radiation Oncology

## 2021-02-28 DIAGNOSIS — Z510 Encounter for antineoplastic radiation therapy: Secondary | ICD-10-CM | POA: Diagnosis not present

## 2021-03-01 ENCOUNTER — Ambulatory Visit
Admission: RE | Admit: 2021-03-01 | Discharge: 2021-03-01 | Disposition: A | Discharge status: Home / Self Care | Payer: Medicare Other | Source: Ambulatory Visit | Attending: Radiation Oncology | Admitting: Radiation Oncology

## 2021-03-01 DIAGNOSIS — Z510 Encounter for antineoplastic radiation therapy: Secondary | ICD-10-CM | POA: Diagnosis not present

## 2021-03-02 ENCOUNTER — Other Ambulatory Visit: Payer: Self-pay

## 2021-03-02 ENCOUNTER — Ambulatory Visit
Admission: RE | Admit: 2021-03-02 | Discharge: 2021-03-02 | Disposition: A | Discharge status: Home / Self Care | Payer: Medicare Other | Source: Ambulatory Visit | Attending: Radiation Oncology | Admitting: Radiation Oncology

## 2021-03-02 DIAGNOSIS — Z510 Encounter for antineoplastic radiation therapy: Secondary | ICD-10-CM | POA: Diagnosis not present

## 2021-03-05 ENCOUNTER — Other Ambulatory Visit: Payer: Self-pay

## 2021-03-05 ENCOUNTER — Ambulatory Visit
Admission: RE | Admit: 2021-03-05 | Discharge: 2021-03-05 | Disposition: A | Discharge status: Home / Self Care | Payer: Medicare Other | Source: Ambulatory Visit | Attending: Radiation Oncology | Admitting: Radiation Oncology

## 2021-03-05 ENCOUNTER — Other Ambulatory Visit: Payer: Self-pay | Admitting: Radiation Oncology

## 2021-03-05 DIAGNOSIS — Z510 Encounter for antineoplastic radiation therapy: Secondary | ICD-10-CM | POA: Diagnosis not present

## 2021-03-05 DIAGNOSIS — C01 Malignant neoplasm of base of tongue: Secondary | ICD-10-CM

## 2021-03-05 MED ORDER — SCOPOLAMINE 1 MG/3DAYS TD PT72: 1.0000 | MEDICATED_PATCH | TRANSDERMAL | 2 refills | Status: DC

## 2021-03-06 ENCOUNTER — Inpatient Hospital Stay: Payer: Medicare (Managed Care) | Admitting: Dietician

## 2021-03-06 ENCOUNTER — Ambulatory Visit
Admission: RE | Admit: 2021-03-06 | Discharge: 2021-03-06 | Disposition: A | Discharge status: Home / Self Care | Payer: Medicare Other | Source: Ambulatory Visit | Attending: Radiation Oncology | Admitting: Radiation Oncology

## 2021-03-06 ENCOUNTER — Encounter: Payer: Medicare (Managed Care) | Admitting: Nutrition

## 2021-03-06 DIAGNOSIS — Z510 Encounter for antineoplastic radiation therapy: Secondary | ICD-10-CM | POA: Diagnosis not present

## 2021-03-06 NOTE — Progress Notes (Signed)
Nutrition Follow-up:  Patient with tongue cancer. He is receiving radiation therapy and has completed 30 of 35 treatments. S/p PEG on 1/03.  Met with patient after treatment. He reports increased soreness in throat. Patient continues to have thick saliva. He reports the scopolamine patch fell off and unable to find it. MD has called in another prescription. Patient using ginger ale rinse as well as baking soda, salt water rinse twice daily. Patient taking small sips of water by mouth with his medications. He is not eating orally. Patient tolerating 4 cartons of [employer] 1.4 via PEG. He reports feeling full and unable to advance to 5 cartons. Patient denies diarrhea or constipation. He has nausea. Patient is taking zofran every 8 hours.    Medications: reviewed   Labs: reviewed   Anthropometrics: Weight 167.8 lb on 1/30  1/23 - 168.4 lb 1/16 - 172.6 lb    Estimated Energy Needs  Kcals: 2200-2400 Protein: 100-120 Fluid: >2.2 L  NUTRITION DIAGNOSIS: Inadequate oral intake continues   INTERVENTION:  Encouraged trying to increase [employer] to 5 cartons/day as able  Suggested waiting 30 minutes after nausea medication before giving bolus Continue baking soda, salt water rinses several times daily Patient reports having plenty of tube feeding and supplies Provided information about services available at [facility] (massage, yoga, thai chi)    MONITORING, EVALUATION, GOAL: weight trends, tube feeding, oral intake    NEXT VISIT: Tuesday February 7 with Pamala Hurry

## 2021-03-07 ENCOUNTER — Ambulatory Visit
Admission: RE | Admit: 2021-03-07 | Discharge: 2021-03-07 | Disposition: A | Discharge status: Home / Self Care | Payer: Medicare Other | Source: Ambulatory Visit | Attending: Radiation Oncology | Admitting: Radiation Oncology

## 2021-03-07 ENCOUNTER — Other Ambulatory Visit: Payer: Self-pay

## 2021-03-07 DIAGNOSIS — Z510 Encounter for antineoplastic radiation therapy: Secondary | ICD-10-CM | POA: Insufficient documentation

## 2021-03-07 DIAGNOSIS — C01 Malignant neoplasm of base of tongue: Secondary | ICD-10-CM | POA: Insufficient documentation

## 2021-03-07 DIAGNOSIS — C770 Secondary and unspecified malignant neoplasm of lymph nodes of head, face and neck: Secondary | ICD-10-CM | POA: Insufficient documentation

## 2021-03-08 ENCOUNTER — Ambulatory Visit
Admission: RE | Admit: 2021-03-08 | Discharge: 2021-03-08 | Disposition: A | Discharge status: Home / Self Care | Payer: Medicare Other | Source: Ambulatory Visit | Attending: Radiation Oncology | Admitting: Radiation Oncology

## 2021-03-08 ENCOUNTER — Other Ambulatory Visit: Payer: Self-pay

## 2021-03-08 DIAGNOSIS — Z510 Encounter for antineoplastic radiation therapy: Secondary | ICD-10-CM | POA: Diagnosis not present

## 2021-03-09 ENCOUNTER — Ambulatory Visit
Admission: RE | Admit: 2021-03-09 | Discharge: 2021-03-09 | Disposition: A | Discharge status: Home / Self Care | Payer: Medicare Other | Source: Ambulatory Visit | Attending: Radiation Oncology | Admitting: Radiation Oncology

## 2021-03-09 DIAGNOSIS — Z510 Encounter for antineoplastic radiation therapy: Secondary | ICD-10-CM | POA: Diagnosis not present

## 2021-03-12 ENCOUNTER — Ambulatory Visit
Admission: RE | Admit: 2021-03-12 | Discharge: 2021-03-12 | Disposition: A | Discharge status: Home / Self Care | Payer: Medicare Other | Source: Ambulatory Visit | Attending: Radiation Oncology | Admitting: Radiation Oncology

## 2021-03-12 ENCOUNTER — Other Ambulatory Visit: Payer: Self-pay | Admitting: Radiation Oncology

## 2021-03-12 ENCOUNTER — Other Ambulatory Visit: Payer: Self-pay

## 2021-03-12 DIAGNOSIS — Z510 Encounter for antineoplastic radiation therapy: Secondary | ICD-10-CM | POA: Diagnosis not present

## 2021-03-12 DIAGNOSIS — C01 Malignant neoplasm of base of tongue: Secondary | ICD-10-CM

## 2021-03-12 MED ORDER — HYDROCODONE-ACETAMINOPHEN 7.5-325 MG PO TABS: 1.0000 | ORAL_TABLET | ORAL | 0 refills | Status: DC | PRN

## 2021-03-13 ENCOUNTER — Encounter: Payer: Self-pay | Admitting: Radiation Oncology

## 2021-03-13 ENCOUNTER — Ambulatory Visit
Admission: RE | Admit: 2021-03-13 | Discharge: 2021-03-13 | Disposition: A | Discharge status: Home / Self Care | Payer: Medicare Other | Source: Ambulatory Visit | Attending: Radiation Oncology | Admitting: Radiation Oncology

## 2021-03-13 ENCOUNTER — Inpatient Hospital Stay: Payer: PRIVATE HEALTH INSURANCE | Attending: Radiation Oncology | Admitting: Nutrition

## 2021-03-13 DIAGNOSIS — Z510 Encounter for antineoplastic radiation therapy: Secondary | ICD-10-CM | POA: Diagnosis not present

## 2021-03-13 NOTE — Progress Notes (Signed)
Patient completed final radiation therapy today for tongue cancer.  Patient's status post PEG on January 3. Weight decreased and documented as 166.8 pounds February 6.  This is decreased from 172 pounds January 3.  Patient reports things are much the same as last week.  He is fatigued and glad to be done with radiation therapy.  Continues to have thick saliva and uses scopolamine patch.  He is using ginger ale rinses and baking soda salt water rinses.  Patient is not eating food but still tries to take sips of water.  He is tolerating 4 cartons of [employer] 1.4 via PEG.  He has difficulty advancing to 5 cartons.  Estimated nutrition needs: 2000-2400 cal, 100-120 g protein, greater than 2.2 L fluid.  Nutrition diagnosis: Inadequate oral intake ongoing.  Intervention: Work to try to increase [employer] to 5 cartons per day.  Continue free water flushes. Medications as needed. Reviewed how to order refills for tube feeding and supplies.  Monitoring, evaluation, goals: Patient will tolerate increased calories and protein to minimize weight loss and promote healing.  Next visit: February 24 after MD visit with Vinnie Level.  **Disclaimer: This note was dictated with voice recognition software. Similar sounding words can inadvertently be transcribed and this note may contain transcription errors which may not have been corrected upon publication of note.**

## 2021-03-13 NOTE — Progress Notes (Signed)
Oncology Nurse Navigator Documentation   Met with Steven Mathews after final RT to offer support and to celebrate end of radiation treatment.   Provided verbal post-RT guidance: Importance of keeping all follow-up appts, especially those with Nutrition and SLP. Importance of protecting treatment area from sun. Continuation of Sonafine application 2-3 times daily, application of antibiotic ointment to areas of raw skin; when supply of Sonafine exhausted transition to OTC lotion with vitamin E.  Explained my role as navigator will continue for several more months, encouraged him to call me with needs/concerns.    Harlow Asa RN, BSN, OCN Head & Neck Oncology Nurse [place] [facility] Phone # [phone]  Fax # [phone]

## 2021-03-27 ENCOUNTER — Other Ambulatory Visit: Payer: Self-pay

## 2021-03-27 ENCOUNTER — Encounter: Payer: Self-pay | Admitting: Physical Therapy

## 2021-03-27 ENCOUNTER — Ambulatory Visit: Payer: Medicare Other | Attending: Radiation Oncology | Admitting: Physical Therapy

## 2021-03-27 ENCOUNTER — Ambulatory Visit (INDEPENDENT_AMBULATORY_CARE_PROVIDER_SITE_OTHER): Payer: Medicare Other

## 2021-03-27 DIAGNOSIS — R600 Localized edema: Secondary | ICD-10-CM | POA: Diagnosis not present

## 2021-03-27 DIAGNOSIS — I459 Conduction disorder, unspecified: Secondary | ICD-10-CM

## 2021-03-27 DIAGNOSIS — L599 Disorder of the skin and subcutaneous tissue related to radiation, unspecified: Secondary | ICD-10-CM | POA: Insufficient documentation

## 2021-03-27 DIAGNOSIS — R1310 Dysphagia, unspecified: Secondary | ICD-10-CM | POA: Diagnosis present

## 2021-03-27 DIAGNOSIS — C01 Malignant neoplasm of base of tongue: Secondary | ICD-10-CM | POA: Diagnosis present

## 2021-03-27 DIAGNOSIS — R293 Abnormal posture: Secondary | ICD-10-CM | POA: Insufficient documentation

## 2021-03-27 LAB — CUP PACEART REMOTE DEVICE CHECK
Battery Remaining Longevity: 131 mo
Battery Voltage: 3.06 V
Brady Statistic AP VP Percent: 3.29 %
Brady Statistic AP VS Percent: 0.04 %
Brady Statistic AS VP Percent: 92.44 %
Brady Statistic AS VS Percent: 4.24 %
Brady Statistic RA Percent Paced: 3.44 %
Brady Statistic RV Percent Paced: 95.72 %
Date Time Interrogation Session: 20230220205745
Implantable Lead Implant Date: 20220523
Implantable Lead Implant Date: 20220523
Implantable Lead Location: 753859
Implantable Lead Location: 753860
Implantable Lead Model: 3830
Implantable Lead Model: 5076
Implantable Pulse Generator Implant Date: 20220523
Lead Channel Impedance Value: 266 Ohm
Lead Channel Impedance Value: 380 Ohm
Lead Channel Impedance Value: 418 Ohm
Lead Channel Impedance Value: 494 Ohm
Lead Channel Pacing Threshold Amplitude: 0.625 V
Lead Channel Pacing Threshold Amplitude: 0.75 V
Lead Channel Pacing Threshold Pulse Width: 0.4 ms
Lead Channel Pacing Threshold Pulse Width: 0.4 ms
Lead Channel Sensing Intrinsic Amplitude: 19.375 mV
Lead Channel Sensing Intrinsic Amplitude: 19.375 mV
Lead Channel Sensing Intrinsic Amplitude: 2.375 mV
Lead Channel Sensing Intrinsic Amplitude: 2.375 mV
Lead Channel Setting Pacing Amplitude: 2 V
Lead Channel Setting Pacing Amplitude: 2.5 V
Lead Channel Setting Pacing Pulse Width: 0.4 ms
Lead Channel Setting Sensing Sensitivity: 1.2 mV

## 2021-03-27 NOTE — Therapy (Signed)
[facility] @ [place], [place], [address] Phone: [phone]   Fax:  [phone]  Physical Therapy Treatment  Patient Details  Name: Steven Mathews MRN: [id number] Date of Birth: January 19, 1946 Referring Provider (PT): Reita May Date: 03/27/2021   PT End of Session - 03/27/21 1036     Visit Number 2    Number of Visits 3    Date for PT Re-Evaluation 04/24/21    PT Start Time 1011    PT Stop Time 1033    PT Time Calculation (min) 22 min    Activity Tolerance Patient tolerated treatment well    Behavior During Therapy [facility] for tasks assessed/performed             Past Medical History:  Diagnosis Date   Atrial fibrillation ([place])    CAD (coronary artery disease)    CABG 2008; 03/13/2018 NSTEMI due to acute thrombotic occlusion of RCA graft, patent LIMA to LAD.   Chronic cough    GERD (gastroesophageal reflux disease)    Hx of adenomatous colonic polyps 04/04/2016   Hyperlipidemia    Hypertension    Myocardial infarct [facility])    Presence of permanent cardiac pacemaker    SVT (supraventricular tachycardia) ([place])     Past Surgical History:  Procedure Laterality Date   CORONARY ARTERY BYPASS GRAFT  03/2006   DIRECT LARYNGOSCOPY N/A 12/11/2020   Procedure: DIRECT LARYNGOSCOPY WITH BIOPSY;  Surgeon: Izora Gala, MD;  Location: [place];  Service: ENT;  Laterality: N/A;   ENDOBRONCHIAL ULTRASOUND N/A 01/08/2021   Procedure: ENDOBRONCHIAL ULTRASOUND;  Surgeon: [name], MD;  [facility];  Service: Pulmonary;  Laterality: N/A;   ESOPHAGOSCOPY N/A 12/11/2020   Procedure: ESOPHAGOSCOPY;  Surgeon: Izora Gala, MD;  Location: [place];  Service: ENT;  Laterality: N/A;   FINE NEEDLE ASPIRATION  01/08/2021   Procedure: FINE NEEDLE ASPIRATION;  Surgeon: [name], MD;  [facility];  Service: Pulmonary;;   HERNIA REPAIR  1964   IR GASTROSTOMY TUBE MOD SED  02/06/2021   LEFT HEART CATH  AND CORS/GRAFTS ANGIOGRAPHY N/A 03/13/2018   Procedure: LEFT HEART CATH AND CORS/GRAFTS ANGIOGRAPHY;  Surgeon: Troy Sine, MD;  Location: [place] CV [facility];  Service: Cardiovascular;  Laterality: N/A;   PACEMAKER IMPLANT N/A 06/26/2020   Procedure: PACEMAKER IMPLANT;  Surgeon: Evans Lance, MD;  Location: [place] [facility];  Service: Cardiovascular;  Laterality: N/A;   RADICAL NECK DISSECTION Right 12/11/2020   Procedure: RIGHT MODIFIED NECK DISSECTION;  Surgeon: Izora Gala, MD;  Location: [place];  Service: ENT;  Laterality: Right;   SUPRAVENTRICULAR TACHYCARDIA ABLATION N/A 09/03/2012   Procedure: SUPRAVENTRICULAR TACHYCARDIA ABLATION;  Surgeon: Evans Lance, MD;  Location: [facility] CATH LAB;  Service: Cardiovascular;  Laterality: N/A;   TONSILLECTOMY Bilateral 12/11/2020   Procedure: BILATERAL TONSILLECTOMY;  Surgeon: Izora Gala, MD;  Location: [place];  Service: ENT;  Laterality: Bilateral;   VIDEO BRONCHOSCOPY  01/08/2021   Procedure: VIDEO BRONCHOSCOPY WITHOUT FLUORO;  Surgeon: [name], MD;  [facility];  Service: Pulmonary;;    There were no vitals filed for this visit.   Subjective Assessment - 03/27/21 1012     Subjective I have not noticed any swelling. I am still having a lot of drainage and my throat is still a mess. I am trying to get off the hydrocodone. My ROM is not bad it is just a little stiff. I have  been doing a few of the exercises that you gave me.    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 Constance Holster in consult and he performed a FNA of a right neck mass revealing SCC, 11/29/20 PET revealed findings suspicious for neoplasm of the head and neck with right neck adenopathy. Asymmetric uptake at the level of the oropharyngeal tonsillar tissue in base of tongue was appreciated as well, noted to be greatest on the left. Moderate increased metabolic activity was also  seen associated with a (presumed) lymph node at the right hilum, 12/11/20 Dr. Constance Holster completed multiple biopsies and excisions. 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 Left tongue base biopsy: non-keratinizing SCC, right tongue base, negative for carcinoma, left and right tonsil excisions, negative for carcinoma., 01/08/21 Biopsy of right hilar lymphadenopathy- negative for carcinoma, will receive 35 fractions of radiation to his Base of tongue and bilateral neck. He will start on 12/19 and complete 03/13/21    Patient Stated Goals to gain info from providers    Currently in Pain? Yes    Pain Score 3     Pain Location Throat    Pain Orientation Right    Pain Descriptors / Indicators Sore    Pain Type Acute pain    Pain Onset More than a month ago    Pain Frequency Constant    Aggravating Factors  swallowing    Pain Relieving Factors hydrocodone    Effect of Pain on Daily Activities unable to eat                [facility] PT Assessment - 03/27/21 0001       Balance Screen   Has the patient fallen in the past 6 months No    Has the patient had a decrease in activity level because of a fear of falling?  Yes   pt reports he has lost 35 lbs recently and has decreased energy which has led to stability issues   Is the patient reluctant to leave their home because of a fear of falling?  No      Home Ecologist residence    Living Arrangements Spouse/significant other    Available Help at Discharge Family    Type of [place]      Prior Function   Level of [place] Part time employment    Probation officer, historical development    Leisure pt reports he is walking the dogs 1/2 miles 1-2x/day      Cognition   Overall Cognitive Status Within Functional Limits for tasks assessed       Observation/Other Assessments   Observations some slight edema present just superior to neck dissection scar with some fibrosis present along scar line      Sit to Stand   Comments 30 sec sit to stand: 17 reps- 13 is average for his age      Posture/Postural Control   Posture/Postural Control Postural limitations    Postural Limitations Rounded Shoulders;Forward head      AROM   Cervical Flexion WFL    Cervical Extension WFL    Cervical - Right Side Bend 50% limited    Cervical - Left Side Bend WFL    Cervical - Right Rotation [facility]    Cervical -  Left Rotation WFL               LYMPHEDEMA/ONCOLOGY QUESTIONNAIRE - 03/27/21 0001       Head and Neck   4 cm superior to sternal notch around neck 35.9 cm    6 cm superior to sternal notch around neck 36.8 cm    8 cm superior to sternal notch around neck 37.9 cm                                 PT Education - 03/27/21 1038     Education Details scar mobilization and very basic MLD for swelling superior to scar    Person(s) Educated Patient    Methods Explanation;Tactile cues    Comprehension Verbalized understanding                 PT Long Term Goals - 03/27/21 1039       PT LONG TERM GOAL #1   Title Pt will return to baseline cervical ROM measurements and not demonstrate any signs or symptoms of lymphedema.    Time 10    Period Weeks    Status On-going                   Plan - 03/27/21 1039     Clinical Impression Statement Pt returns to PT after completing radiation for treatment of base of tongue cancer. Pt underwent R neck dissection. He had some post surgical edema prior to beginning radiation and still presents with very mild edema just superior to his neck dissection scar. He also has fibrosis along scar tissue. Educated pt in scar mobilization technique today and very basic MLD for edema. His ROM has returned to baseline. He was able to complete more sit to stands today than  baseline though he does report increased fatigue and a fear of falling. Pt reports he has had significant weight loss and still is unable to eat due to throat pain. He would like to try to exercise at home over the next month and work on scar mobilization independently. Will reassess pt in another month to see if the swelling remains and how is fatigue levels are. If he still demonstrates edema or if it has worsened will begin PT.    PT Frequency [place]    PT Duration 4 weeks    PT Treatment/Interventions ADLs/Self Care Home Management;Patient/family education;Therapeutic exercise;Manual lymph drainage;Manual techniques;Compression bandaging;Scar mobilization;Orthotic Fit/Training;Passive range of motion    PT Next Visit Plan reassess if pt still has edema or decreased scar mobilization, does he need PT for high level balance    PT Home Exercise Plan head and neck ROM exercises, walking, scar mobilization    Consulted and Agree with Plan of Care Patient             Patient will benefit from skilled therapeutic intervention in order to improve the following deficits and impairments:  Postural dysfunction, Decreased knowledge of precautions, Increased edema, Increased fascial restricitons  Visit Diagnosis: Localized edema  Disorder of the skin and subcutaneous tissue related to radiation, unspecified  Abnormal posture  Malignant neoplasm of base of tongue ([place])     Problem List Patient Active Problem List   Diagnosis Date Noted   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   Encounter for fitting or adjustment of dental prosthetic device 01/10/2021   Hilar lymphadenopathy    Encounter for preoperative dental examination 01/02/2021   Tooth  missing 01/02/2021   Gingival recession, generalized 01/02/2021   Defective dental restoration 01/02/2021   Dental attrition, excessive 01/02/2021   Diastema 01/02/2021   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   Malignant neoplasm of base of tongue ([place]) 12/27/2020    Pacemaker 12/19/2020   Neck mass 12/11/2020   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[place]) 12/11/2020   Bradycardia 08/01/2020   Heart block 06/13/2020   Therapeutic drug monitoring 09/16/2018   Rash 05/25/2018   Essential hypertension 05/25/2018   Educated about COVID-19 virus infection 05/25/2018   Acute ST elevation myocardial infarction (STEMI) due to occlusion of right coronary artery ([place]) 03/13/2018   Hx of CABG    Hx of adenomatous colonic polyps 04/04/2016   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

## 2021-03-28 ENCOUNTER — Ambulatory Visit: Payer: Medicare Other

## 2021-03-28 DIAGNOSIS — R600 Localized edema: Secondary | ICD-10-CM | POA: Diagnosis not present

## 2021-03-28 DIAGNOSIS — R1310 Dysphagia, unspecified: Secondary | ICD-10-CM

## 2021-03-28 NOTE — Therapy (Signed)
[facility] [address] [place], [place], [address] Phone: [phone]   Fax:  [phone]  Speech Language Pathology Treatment  Patient Details  Name: Steven Mathews MRN: [id number] Date of Birth: October 29, 1945 Referring Provider (SLP): Eppie Gibson, MD   Encounter Date: 03/28/2021   End of Session - 03/28/21 1212     Visit Number 3    Number of Visits 4    Date for SLP Re-Evaluation 04/18/21    SLP Start Time 52    SLP Stop Time  1055    SLP Time Calculation (min) 36 min    Activity Tolerance Patient tolerated treatment well             Past Medical History:  Diagnosis Date   Atrial fibrillation ([place])    CAD (coronary artery disease)    CABG 2008; 03/13/2018 NSTEMI due to acute thrombotic occlusion of RCA graft, patent LIMA to LAD.   Chronic cough    GERD (gastroesophageal reflux disease)    Hx of adenomatous colonic polyps 04/04/2016   Hyperlipidemia    Hypertension    Myocardial infarct [facility])    Presence of permanent cardiac pacemaker    SVT (supraventricular tachycardia) ([place])     Past Surgical History:  Procedure Laterality Date   CORONARY ARTERY BYPASS GRAFT  03/2006   DIRECT LARYNGOSCOPY N/A 12/11/2020   Procedure: DIRECT LARYNGOSCOPY WITH BIOPSY;  Surgeon: Izora Gala, MD;  Location: [place];  Service: ENT;  Laterality: N/A;   ENDOBRONCHIAL ULTRASOUND N/A 01/08/2021   Procedure: ENDOBRONCHIAL ULTRASOUND;  Surgeon: [name], MD;  [facility];  Service: Pulmonary;  Laterality: N/A;   ESOPHAGOSCOPY N/A 12/11/2020   Procedure: ESOPHAGOSCOPY;  Surgeon: Izora Gala, MD;  Location: [place];  Service: ENT;  Laterality: N/A;   FINE NEEDLE ASPIRATION  01/08/2021   Procedure: FINE NEEDLE ASPIRATION;  Surgeon: [name], MD;  [facility];  Service: Pulmonary;;   HERNIA REPAIR  1964   IR GASTROSTOMY TUBE MOD SED  02/06/2021   LEFT HEART CATH AND CORS/GRAFTS ANGIOGRAPHY N/A 03/13/2018    Procedure: LEFT HEART CATH AND CORS/GRAFTS ANGIOGRAPHY;  Surgeon: Troy Sine, MD;  Location: [place] CV LAB;  Service: Cardiovascular;  Laterality: N/A;   PACEMAKER IMPLANT N/A 06/26/2020   Procedure: PACEMAKER IMPLANT;  Surgeon: Evans Lance, MD;  Location: [place] CV LAB;  Service: Cardiovascular;  Laterality: N/A;   RADICAL NECK DISSECTION Right 12/11/2020   Procedure: RIGHT MODIFIED NECK DISSECTION;  Surgeon: Izora Gala, MD;  Location: [place];  Service: ENT;  Laterality: Right;   SUPRAVENTRICULAR TACHYCARDIA ABLATION N/A 09/03/2012   Procedure: SUPRAVENTRICULAR TACHYCARDIA ABLATION;  Surgeon: Evans Lance, MD;  Location: [facility] CATH LAB;  Service: Cardiovascular;  Laterality: N/A;   TONSILLECTOMY Bilateral 12/11/2020   Procedure: BILATERAL TONSILLECTOMY;  Surgeon: Izora Gala, MD;  Location: [place];  Service: ENT;  Laterality: Bilateral;   VIDEO BRONCHOSCOPY  01/08/2021   Procedure: VIDEO BRONCHOSCOPY WITHOUT FLUORO;  Surgeon: [name], MD;  [facility];  Service: Pulmonary;;    There were no vitals filed for this visit.   Subjective Assessment - 03/28/21 1030     Subjective "I tried some ice cream yesterday."    Currently in Pain? Yes    Pain Score 3     Pain Orientation Right    Pain Descriptors / Indicators Sore    Pain Type Acute pain  ADULT SLP TREATMENT - 03/28/21 1031       General Information   Behavior/Cognition Alert;Cooperative;Pleasant mood      Treatment Provided   Treatment provided Dysphagia      Dysphagia Treatment   Temperature Spikes Noted No    Respiratory Status Room air    Oral Cavity - Dentition Adequate natural dentition    Treatment Methods Therapeutic exercise;Patient/caregiver education    Patient observed directly with PO's Yes    Type of PO's observed Thin liquids    Oral Phase Signs & Symptoms Other (comment)   none noted   Pharyngeal Phase Signs & Symptoms Other (comment)   none  noted   Other treatment/comments HEP completed today with modified independence. Steven Mathews tells SLP he has not done much of HEP in last ~4 weeks due to odynophagia and thick saliva. SLP educated pt about s/sx aspiration and he told SLP 3 with modified independence. SLP also shared about food journal benefits - pt verbally demonstrated understanding to SLP. SLP reiterated that pt oculd also cycle throuh the HEP instead of doing 10 reps of swallowing exericses at once and moving on to the next swallowing exercise. He told SLP rationale for HEP.SLP encouraged pt to incr HEP frequency, cycle through exercises, and to have dys I-II items regularly.      Assessment / Recommendations / Plan   Plan Continue with current plan of care      Dysphagia Recommendations   Diet recommendations Dysphagia 1 (puree);Dysphagia 2 (fine chop);Thin liquid    Medication Administration --   as tolerated     Progression Toward Goals   Progression toward goals Progressing toward goals              SLP Education - 03/28/21 1051     Education Details aspiration PNA s/sx, cycling through HEP instead of sequential completion, food journal    Person(s) Educated Patient    Methods Explanation;Handout    Comprehension Verbalized understanding              SLP Short Term Goals - 03/28/21                      SLP SHORT TERM GOAL #1    Title pt will complete HEP with rare min A     Time      Period --   visits, for all STGs    Status Achieved    Target Date 02/16/21            SLP SHORT TERM GOAL #2    Title pt will tell SLP why pt is completing HEP with modified independence     Time        Status Achieved    Target Date 03/16/21            SLP SHORT TERM GOAL #3    Title   Status pt will describe 3 overt s/s aspiration PNA with modified independence  Achieved    Time     Target Date 04/13/21                    SLP SHORT TERM GOAL #4  Title pt will tell SLP how a food journal can facilitate  quicker return to more normal POs following rad tx  Time Status  Achieved  Target Date 04/13/21  SLP Long Term Goals - 03/28/21                      SLP LONG TERM GOAL #1    Title pt will complete HEP with modified independence in 2 sessions     Time 1       03/28/21    Period --   visits, for all LTGs    Status Ongoing     Target Date 05/18/21            SLP LONG TERM GOAL #2    Title pt will describe how to modify HEP over time, and the timeline associated with reduction in HEP frequency with modified independence over two sessions     Time Status 3  Ongoing    Target Date 06/22/21                     Plan - 03/28/21        Clinical Impression Statement Pt's swallow deemed WFL/WNL for dys I-II items and thin liquids based upon pt's performance today. SLP reviewed pt's individualized HEP for dysphagia and pt completed each exercise on their own with modified independence. There are no overt s/s aspiration reported by pt at this time. Data indicate that pt's swallow ability will likely decrease over the course of radiation therapy and could very well decline over time following conclusion of their radiation therapy due to muscle disuse atrophy and/or muscle fibrosis. Pt will cont to need to be seen by SLP in order to assess safety of PO intake, assess the need for recommending any objective swallow assessment, and ensuring pt correctly completes the individualized HEP.     Speech Therapy Frequency --   once approx every 4 weeks    Duration --   90 days    Treatment/Interventions Aspiration precaution training;Pharyngeal strengthening exercises;Diet toleration management by SLP;Compensatory techniques;SLP instruction and feedback;Patient/family education;Compensatory strategies     Potential to Achieve Goals Good     SLP Home Exercise Plan provided today     Consulted and Agree with Plan of Care Patient           Patient will benefit from  skilled therapeutic intervention in order to improve the following deficits and impairments:   Dysphagia, unspecified type    Problem List Patient Active Problem List   Diagnosis Date Noted   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   Encounter for fitting or adjustment of dental prosthetic device 01/10/2021   Hilar lymphadenopathy    Encounter for preoperative dental examination 01/02/2021   Tooth missing 01/02/2021   Gingival recession, generalized 01/02/2021   Defective dental restoration 01/02/2021   Dental attrition, excessive 01/02/2021   Diastema 01/02/2021   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   Malignant neoplasm of base of tongue ([place]) 12/27/2020   Pacemaker 12/19/2020   Neck mass 12/11/2020   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[place]) 12/11/2020   Bradycardia 08/01/2020   Heart block 06/13/2020   Therapeutic drug monitoring 09/16/2018   Rash 05/25/2018   Essential hypertension 05/25/2018   Educated about COVID-19 virus infection 05/25/2018   Acute ST elevation myocardial infarction (STEMI) due to occlusion of right coronary artery ([place]) 03/13/2018   Hx of CABG    Hx of adenomatous colonic polyps 04/04/2016   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 03/28/2021, 12:13 PM  [facility] [address], [place], [place], [address] Phone: [phone]   Fax:  [phone]   Name: Steven Mathews MRN: [id number] Date of Birth: Jun 06, 1945

## 2021-03-28 NOTE — Patient Instructions (Signed)
   Signs of Aspiration Pneumonia   Chest pain/tightness Fever (can be low grade) Cough  With foul-smelling phlegm (sputum) With sputum containing pus or blood With greenish sputum Fatigue  Shortness of breath  Wheezing   **IF YOU HAVE THESE SIGNS, CONTACT YOUR DOCTOR OR GO TO THE EMERGENCY DEPARTMENT OR URGENT CARE AS SOON AS POSSIBLE**     

## 2021-03-29 NOTE — Progress Notes (Signed)
[name] presents today for follow-up after completing radiation to his base of tongue on 03/13/2021  Pain issues, if any: Continues to deal with back of the mouth/upper throat pain. States he's trying to decrease his use of the prn hydrocodone  Using a feeding tube?: Yes--reports he's instilling between 4-5 cartons of supplement daily. States they can make him nauseated, so he is still utilizing the prn nausea medication Weight changes, if any:  Wt Readings from Last 3 Encounters:  03/30/21 163 lb 9.6 oz (74.2 kg)  02/19/21 172 lb 9.6 oz (78.3 kg)  02/13/21 171 lb 3.2 oz (77.7 kg)   Swallowing issues, if any: Yes--continues to only tolerate liquids. Saw Glendell Docker Schinke-SLP on 03/28/2021 "Pt's swallow deemed WFL/WNL for dys I-II items and thin liquids based upon pt's performance today. SLP reviewed pt's individualized HEP for dysphagia and pt completed each exercise on their own with modified independence. There are no overt s/s aspiration reported by pt at this time." Smoking or chewing tobacco? None Using fluoride trays daily? N/A--scheduled for F/U with Dr. Benson Norway on 05/04/2021 Last ENT visit was on: Not since diagnosis Other notable issues, if any: Reports difficulty sleeping due to sinus drainage and sore throat. Denies any ear or jaw pain, or difficulty opening his mouth. Skin appears to be healing well within treatment field. Overall reports that he is feeling marginally better, but is still fatigued and not back to his baseline

## 2021-03-30 ENCOUNTER — Encounter: Payer: Medicare Other | Admitting: Dietician

## 2021-03-30 ENCOUNTER — Other Ambulatory Visit: Payer: Self-pay

## 2021-03-30 ENCOUNTER — Inpatient Hospital Stay: Payer: PRIVATE HEALTH INSURANCE | Admitting: Dietician

## 2021-03-30 ENCOUNTER — Ambulatory Visit
Admission: RE | Admit: 2021-03-30 | Discharge: 2021-03-30 | Disposition: A | Discharge status: Home / Self Care | Payer: Medicare Other | Source: Ambulatory Visit | Attending: Radiation Oncology | Admitting: Radiation Oncology

## 2021-03-30 ENCOUNTER — Encounter: Payer: Self-pay | Admitting: Radiation Oncology

## 2021-03-30 VITALS — BP 95/66 | HR 94 | Temp 97.7°F | Resp 20 | Ht 72.0 in | Wt 163.6 lb

## 2021-03-30 DIAGNOSIS — Z7982 Long term (current) use of aspirin: Secondary | ICD-10-CM | POA: Insufficient documentation

## 2021-03-30 DIAGNOSIS — R5383 Other fatigue: Secondary | ICD-10-CM | POA: Diagnosis not present

## 2021-03-30 DIAGNOSIS — Z923 Personal history of irradiation: Secondary | ICD-10-CM | POA: Insufficient documentation

## 2021-03-30 DIAGNOSIS — C01 Malignant neoplasm of base of tongue: Secondary | ICD-10-CM | POA: Diagnosis present

## 2021-03-30 DIAGNOSIS — Z79899 Other long term (current) drug therapy: Secondary | ICD-10-CM | POA: Diagnosis not present

## 2021-03-30 NOTE — Progress Notes (Signed)
Oncology Nurse Navigator Documentation   I met with Steven Mathews during his follow up visit with Dr. Isidore Moos today. He is still recovering from his radiation treatment for his head and neck cancer. He is not able to eat orally but is using his PEG tube as recommended. He also met with nutrition today as scheduled. Steven Mathews will return in May to receive results of his post-treatment PET scan from Dr. Isidore Moos. He knows to call me for any needs before that time.   Harlow Asa RN, BSN, OCN Head & Neck Oncology Nurse [place] [facility] Phone # [phone]  Fax # [phone]

## 2021-03-30 NOTE — Progress Notes (Signed)
Radiation Oncology         [phone] ________________________________  Name: Oral Remache MRN: [id number]  Date: 03/30/2021  DOB: 1945-08-18  Follow-Up Visit Note  Outpatient  CC: Cathlean Sauer, MD  Izora Gala, MD  Diagnosis and Prior Radiotherapy:    ICD-10-CM   1. Malignant neoplasm of base of tongue ([place])  C01      Cancer Staging  Malignant neoplasm of base of tongue ([place]) Staging form: Pharynx - HPV-Mediated Oropharynx, AJCC 8th Edition - Clinical stage from 12/27/2020: Stage II (cT1, cN2, cM0, p16+) - Signed by Eppie Gibson, MD on 12/27/2020 Stage prefix: Initial diagnosis   CHIEF COMPLAINT: Here for follow-up and surveillance of throat cancer  Narrative:  The patient returns today for routine follow-up.  Mr. Simmer presents today for follow-up after completing radiation to his base of tongue on 03/13/2021  Pain issues, if any: Continues to deal with back of the mouth/upper throat pain. States he's trying to decrease his use of the prn hydrocodone  Using a feeding tube?: Yes--reports he's instilling between 4-5 cartons of supplement daily. States they can make him nauseated, so he is still utilizing the prn nausea medication Weight changes, if any:  Wt Readings from Last 3 Encounters:  03/30/21 163 lb 9.6 oz (74.2 kg)  02/19/21 172 lb 9.6 oz (78.3 kg)  02/13/21 171 lb 3.2 oz (77.7 kg)   Swallowing issues, if any: Yes--continues to only tolerate liquids. Saw Glendell Docker Schinke-SLP on 03/28/2021 "Pt's swallow deemed WFL/WNL for dys I-II items and thin liquids based upon pt's performance today. SLP reviewed pt's individualized HEP for dysphagia and pt completed each exercise on their own with modified independence. There are no overt s/s aspiration reported by pt at this time." Smoking or chewing tobacco? None Using fluoride trays daily? N/A--scheduled for F/U with Dr. Benson Norway on 05/04/2021 Last ENT visit was on: Not since diagnosis Other notable issues, if any: Reports  difficulty sleeping due to sinus drainage and sore throat. Denies any ear or jaw pain, or difficulty opening his mouth. Skin appears to be healing well within treatment field. Overall reports that he is feeling marginally better, but is still fatigued and not back to his baseline                               ALLERGIES:  has No Known Allergies.  Meds: Current Outpatient Medications  Medication Sig Dispense Refill   scopolamine (TRANSDERM-SCOP) 1 MG/3DAYS Place 1 patch (1.5 mg total) onto the skin every 3 (three) days. 10 patch 2   aspirin 81 MG chewable tablet Chew 1 tablet (81 mg total) by mouth daily. 90 tablet 3   HYDROcodone-acetaminophen (NORCO) 7.5-325 MG tablet Take 1 tablet by mouth every 4 (four) hours as needed for moderate pain. Take with food. 80 tablet 0   lidocaine (XYLOCAINE) 2 % solution Patient: Mix 1part 2% viscous lidocaine, 1part H20. Swish & swallow 51mL of diluted mixture, 51min before meals and at bedtime, up to QID prn soreness. (Patient not taking: Reported on 02/01/2021) 200 mL 3   metoprolol succinate (TOPROL-XL) 50 MG 24 hr tablet Take 50 mg by mouth daily. Take with or immediately following a meal.     Nutritional Supplements (KATE FARMS STANDARD 1.4) LIQD 1 Bottle by Enteral route 5 (five) times daily. 1625 mL 6   omeprazole (PRILOSEC) 40 MG capsule Take 40 mg by mouth daily.     ondansetron (ZOFRAN) 8  MG tablet Take 1 tablet (8 mg total) by mouth every 8 (eight) hours as needed for nausea or vomiting. 30 tablet 5   OVER THE COUNTER MEDICATION Take 2 tablets by mouth at bedtime as needed (sleep). Relaxium Sleep     phenol (CHLORASEPTIC) 1.4 % LIQD Use as directed 1 spray in the mouth or throat as needed for throat irritation / pain.     rosuvastatin (CRESTOR) 40 MG tablet Take 40 mg by mouth daily.     sildenafil (VIAGRA) 50 MG tablet Take 1 tablet (50 mg total) by mouth daily as needed for erectile dysfunction. (Patient not taking: Reported on 02/01/2021) 20 tablet 3    Wound Dressings (SONAFINE EX) Apply 1 application topically daily as needed (scar care).     No current facility-administered medications for this encounter.    Physical Findings: The patient is in no acute distress. Patient is alert and oriented.  height is 6' (1.829 m) and weight is 163 lb 9.6 oz (74.2 kg). His temperature is 97.7 F (36.5 C). His blood pressure is 95/66 and his pulse is 94. His respiration is 20 and oxygen saturation is 100%. .    General: Alert and oriented, in no acute distress HEENT: Head is normocephalic. Extraocular movements are intact. Oropharynx - no thrush or tumor. Mucositis is minimal Neck: Neck is supple, no palpable cervical or supraclavicular lymphadenopathy. Extremities: No cyanosis or edema. Lymphatics: see Neck Exam Skin: dry, flaking over neck Psychiatric: Judgment and insight are intact. Affect is appropriate.    Lab Findings: Lab Results  Component Value Date   WBC 5.7 02/06/2021   HGB 13.5 02/06/2021   HCT 39.4 02/06/2021   MCV 92.7 02/06/2021   PLT 179 02/06/2021    Radiographic Findings: CUP PACEART REMOTE DEVICE CHECK  Result Date: 03/27/2021 Scheduled remote reviewed. Normal device function.  Next remote 91 days- JJB   Impression/Plan:    1) Head and Neck Cancer Status: healing well from RT  2) Nutritional Status: stabilizing PEG tube: using  3)  Swallowing: continue SLP, currently only tolerating liquids  5) Dental: Encouraged to continue regular followup with dentistry, and dental hygiene including fluoride rinses.   6) Thyroid function:  check annually Lab Results  Component Value Date   TSH 0.536 01/09/2021    7) Other: apply Vit E oil BID to neck for healing of skin  8) Follow-up in early May w/ restaging PET. The patient was encouraged to call with any issues or questions before then.   On date of service, in total, I spent 25 minutes on this encounter. Patient was seen in person.   _____________________________________   Eppie Gibson, MD

## 2021-03-30 NOTE — Progress Notes (Signed)
Nutrition Follow-up:  Patient has completed radiation therapy for tongue cancer. Final treatment completed 2/07. S/p on January 3.   Met with patient in radiation clinic. Patient reports ongoing thick saliva and associated nausea. He is using ginger ale rinses several times/day. This is working well for him. He reports having increased sinus drainage for which MD suggested pt could try sudafed. Patient continues to have no taste and pain with swallowing. He reports trying small bites of ice cream. Patient taking small sips of water by mouth. Patient continues to use feeding tube. He is tolerating four cartons [employer] 1.4. Patient unable to advance to 5 cartons. This is too much volume for him.    Medications: reviewed   Labs: reviewed   Anthropometrics: Weight 164.8 lb today decreased   2/6 - 166.8 lb 1/3 - 172 lb   Estimated Energy Needs  Kcals: 2000-2400 Protein: 100-120 Fluid: 2.2 L  NUTRITION DIAGNOSIS: Inadequate oral intake continues, pt relying on feeding tube for nutrition/hydration   INTERVENTION:  Encouraged frequent small bites of soft smooth foods as tolerated Encouraged completing HEP exercises as prescribed per SLP Continue [employer] 1.4 - work to increase to 5 cartons for weight stability. Continue free water flushes Patient has ordered additional formula and supplies, this will be delivered today per pt    MONITORING, EVALUATION, GOAL: weight trends, intake, tube feeding   NEXT VISIT: Monday March 20 with Pamala Hurry for weight check

## 2021-04-03 NOTE — Progress Notes (Signed)
Remote pacemaker transmission.   

## 2021-04-03 NOTE — Progress Notes (Signed)
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   Plan: The patient will follow-up with radiation oncology in 2 wks.   ------------------------------  Eppie Gibson, MD

## 2021-04-16 ENCOUNTER — Other Ambulatory Visit: Payer: Self-pay | Admitting: Radiation Oncology

## 2021-04-16 ENCOUNTER — Telehealth: Payer: Self-pay

## 2021-04-16 DIAGNOSIS — C01 Malignant neoplasm of base of tongue: Secondary | ICD-10-CM

## 2021-04-16 MED ORDER — HYDROCODONE-ACETAMINOPHEN 5-325 MG PO TABS: 1.0000 | ORAL_TABLET | Freq: Four times a day (QID) | ORAL | 0 refills | Status: DC | PRN

## 2021-04-16 NOTE — Telephone Encounter (Signed)
Called and left detailed VM with instructions regarding prescription pain medication refill. Also sent patient a MyChart message relaying same information. Provided my direct call back number should he have any additional questions/concerns ?

## 2021-04-23 ENCOUNTER — Inpatient Hospital Stay: Payer: PRIVATE HEALTH INSURANCE | Attending: Radiation Oncology | Admitting: Nutrition

## 2021-04-23 ENCOUNTER — Other Ambulatory Visit: Payer: Self-pay

## 2021-04-23 NOTE — Progress Notes (Signed)
Nutrition follow-up completed with patient status post completion of radiation therapy on February 7 for tongue cancer. ? ?Weight decreased and was documented as 158.6 pounds March 20.  This is decreased from 172 pounds January 3 and 166.8 pounds February 6.  Weight continues to trend down overall. ? ?Labs were reviewed. ? ?Patient states food tastes awful and he has been unable to eat by mouth.  He continues to only use 4 cartons of [employer] 1.4 via feeding tube. (Goal is 5 cartons) He verbalizes desire to increase oral intake.  He is reporting increased fatigue.  Seems discouraged regarding length of time for healing. ? ?Nutrition diagnosis: Inadequate oral intake continues. ? ?Intervention: ?Stressed importance of increasing overall calories and protein to promote healing.  Patient requires additional fifth carton of [employer] 1.4 daily to provide adequate calories and protein. ?Recommended baking soda and salt water rinses prior to trying foods. ?Recommended patient try to sip on Ensure complete for additional calories and protein in addition to water and other fluids. ?Encouraged him to take medication as prescribed by MD secondary to sinus drainage. ?Explained the connection between nutrition and healing. ?Provided support and encouragement. ? ?Monitoring, evaluation, goals: ?Patient will work to increase calories and protein to minimize further weight loss. ? ?Next visit: Telephone follow-up in 4 to 6 weeks. ? ?**Disclaimer: This note was dictated with voice recognition software. Similar sounding words can inadvertently be transcribed and this note may contain transcription errors which may not have been corrected upon publication of note.** ? ?

## 2021-04-24 ENCOUNTER — Ambulatory Visit: Payer: Medicare Other | Attending: Radiation Oncology | Admitting: Physical Therapy

## 2021-04-24 ENCOUNTER — Encounter: Payer: Self-pay | Admitting: Physical Therapy

## 2021-04-24 DIAGNOSIS — C01 Malignant neoplasm of base of tongue: Secondary | ICD-10-CM | POA: Insufficient documentation

## 2021-04-24 DIAGNOSIS — R293 Abnormal posture: Secondary | ICD-10-CM | POA: Diagnosis present

## 2021-04-24 DIAGNOSIS — Z9181 History of falling: Secondary | ICD-10-CM | POA: Diagnosis present

## 2021-04-24 DIAGNOSIS — R1310 Dysphagia, unspecified: Secondary | ICD-10-CM | POA: Diagnosis present

## 2021-04-24 DIAGNOSIS — L599 Disorder of the skin and subcutaneous tissue related to radiation, unspecified: Secondary | ICD-10-CM | POA: Insufficient documentation

## 2021-04-24 DIAGNOSIS — R42 Dizziness and giddiness: Secondary | ICD-10-CM | POA: Diagnosis present

## 2021-04-24 DIAGNOSIS — R600 Localized edema: Secondary | ICD-10-CM | POA: Insufficient documentation

## 2021-04-24 NOTE — Therapy (Signed)
[facility] ?[place], [place], [address] ?Phone: [phone]   Fax:  [phone] ? ?Physical Therapy Treatment ? ?Patient Details  ?Name: Steven Mathews ?MRN: [id number] ?Date of Birth: 30-Aug-1945 ?Referring Provider (PT): Isidore Moos ? ? ?Encounter Date: 04/24/2021 ? ? PT End of Session - 04/24/21 1215   ? ? Visit Number 3   ? Number of Visits 7   ? Date for PT Re-Evaluation 06/05/21   on hold for 2 weeks per pt request  ? PT Start Time 1109   ? PT Stop Time 1145   ? PT Time Calculation (min) 36 min   ? Activity Tolerance Patient tolerated treatment well   ? Behavior During Therapy [facility] for tasks assessed/performed   ? ?  ?  ? ?  ? ? ?Past Medical History:  ?Diagnosis Date  ? Atrial fibrillation ([place])   ? CAD (coronary artery disease)   ? 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  ? Chronic cough   ? GERD (gastroesophageal reflux disease)   ? Hx of adenomatous colonic polyps 04/04/2016  ? Hyperlipidemia   ? Hypertension   ? Myocardial infarct [facility])   ? Presence of permanent cardiac pacemaker   ? SVT (supraventricular tachycardia) ([place])   ? ? ?Past Surgical History:  ?Procedure Laterality Date  ? CORONARY ARTERY BYPASS GRAFT  03/2006  ? DIRECT LARYNGOSCOPY N/A 12/11/2020  ? Procedure: DIRECT LARYNGOSCOPY WITH BIOPSY;  Surgeon: Izora Gala, MD;  Location: [facility];  Service: ENT;  Laterality: N/A;  ? ENDOBRONCHIAL ULTRASOUND N/A 01/08/2021  ? Procedure: ENDOBRONCHIAL ULTRASOUND;  Surgeon: [name], MD;  Location: [facility];  Service: Pulmonary;  Laterality: N/A;  ? ESOPHAGOSCOPY N/A 12/11/2020  ? Procedure: ESOPHAGOSCOPY;  Surgeon: Izora Gala, MD;  Location: [facility];  Service: ENT;  Laterality: N/A;  ? FINE NEEDLE ASPIRATION  01/08/2021  ? Procedure: FINE NEEDLE ASPIRATION;  Surgeon: [name], MD;  Location: [facility];  Service: Pulmonary;;  ? [place]  ? IR GASTROSTOMY TUBE  MOD SED  02/06/2021  ? LEFT HEART CATH AND CORS/GRAFTS ANGIOGRAPHY N/A 03/13/2018  ? Procedure: LEFT HEART CATH AND CORS/GRAFTS ANGIOGRAPHY;  Surgeon: Troy Sine, MD;  Location: [facility] CV LAB;  Service: Cardiovascular;  Laterality: N/A;  ? PACEMAKER IMPLANT N/A 06/26/2020  ? Procedure: PACEMAKER IMPLANT;  Surgeon: Evans Lance, MD;  Location: [facility] CV LAB;  Service: Cardiovascular;  Laterality: N/A;  ? RADICAL NECK DISSECTION Right 12/11/2020  ? Procedure: RIGHT MODIFIED NECK DISSECTION;  Surgeon: Izora Gala, MD;  Location: [facility];  Service: ENT;  Laterality: Right;  ? SUPRAVENTRICULAR TACHYCARDIA ABLATION N/A 09/03/2012  ? Procedure: SUPRAVENTRICULAR TACHYCARDIA ABLATION;  Surgeon: Evans Lance, MD;  Location: [facility] CATH LAB;  Service: Cardiovascular;  Laterality: N/A;  ? TONSILLECTOMY Bilateral 12/11/2020  ? Procedure: BILATERAL TONSILLECTOMY;  Surgeon: Izora Gala, MD;  Location: [facility];  Service: ENT;  Laterality: Bilateral;  ? VIDEO BRONCHOSCOPY  01/08/2021  ? Procedure: VIDEO BRONCHOSCOPY WITHOUT FLUORO;  Surgeon: [name], MD;  Location: [facility];  Service: Pulmonary;;  ? ? ?There were no vitals filed for this visit. ? ? Subjective Assessment - 04/24/21 1110   ? ? Subjective I am doing a little better. I am starting to eat some pudding. I am doing 5 feedings a day starting this week to get my energy level up. My balance is ok  as long as I keep the feedings up. If I don't I get some dizzy spells. I am off the hydrocodone now. My throat has gotten better. Before I start anything I really want to get my energy level up.   ? 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 Constance Holster in consult and he performed a FNA of a right neck mass revealing SCC, 11/29/20 PET revealed findings suspicious for neoplasm of the head and neck with right neck adenopathy. Asymmetric uptake at the level of the  oropharyngeal tonsillar tissue in base of tongue was appreciated as well, noted to be greatest on the left. Moderate increased metabolic activity was also seen associated with a (presumed) lymph node at the right hilum, 12/11/20 Dr. Constance Holster completed multiple biopsies and excisions. 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 Left tongue base biopsy: non-keratinizing SCC, right tongue base, negative for carcinoma, left and right tonsil excisions, negative for carcinoma., 01/08/21 Biopsy of right hilar lymphadenopathy- negative for carcinoma, will receive 35 fractions of radiation to his Base of tongue and bilateral neck. He will start on 12/19 and complete 03/13/21   ? Patient Stated Goals to gain info from providers   ? Currently in Pain? Yes   ? Pain Score 2    ? Pain Location Throat   ? Pain Orientation Right   ? Pain Descriptors / Indicators Sore   ? Pain Type Acute pain   ? Pain Onset More than a month ago   ? Pain Frequency Constant   ? Aggravating Factors  dry mouth   ? Pain Relieving Factors hydrocodone but pt has mostly stopped taking this   ? Effect of Pain on Daily Activities no effect   ? ?  ?  ? ?  ? ? ? ? ? [facility] PT Assessment - 04/24/21 0001   ? ?  ? Precautions  ? Precautions Other (comment);ICD/Pacemaker   ? Precaution Comments risk of lymphedema/ pacemaker   ?  ? Balance Screen  ? Has the patient fallen in the past 6 months Yes   4-5 days ago  ? How many times? 1- pt stood up too fast from lying down and got dizzy   ? Has the patient had a decrease in activity level because of a fear of falling?  No   ? Is the patient reluctant to leave their home because of a fear of falling?  No   ?  ? Prior Function  ? Level of Independence Independent   ? Vocation Part time employment   ? Vocation Requirements consulting, historical development   ? Leisure pt reports he is walking the dogs 1/2  miles 1-2x/day, pt very active   ?  ? Cognition  ? Overall Cognitive Status Within Functional Limits for tasks assessed   ?  ? Observation/Other Assessments  ? Observations some slight edema present just superior to neck dissection scar with some fibrosis present along scar line   ?  ? Functional Tests  ? Functional tests Sit to Stand   ?  ? Sit to Stand  ? Comments 30 sec sit to stand: 18 reps- 13 is average for his age   ?  ? Posture/Postural Control  ? Posture/Postural Control Postural limitations   ? Postural Limitations Rounded Shoulders;Forward head   ?  ? AROM  ?  Cervical Flexion WFL   ? Cervical Extension WFL   ? Cervical - Right Side Bend 50% limited   ? Cervical - Left Side [facility]   ? Cervical - Right Rotation WFL   ? Cervical - Left Rotation WFL   ?  ? Strength  ? Right Hip Flexion 5/5   ? Right Hip Extension 4/5   ? Right Hip ABduction 5/5   ? Left Hip Flexion 5/5   ? Left Hip Extension 4/5   ? Left Hip ABduction 5/5   ? Right Knee Flexion 4/5   ? Right Knee Extension 5/5   ? Left Knee Flexion 3+/5   ? Left Knee Extension 5/5   ? Right Ankle Dorsiflexion 5/5   ? Left Ankle Dorsiflexion 4/5   ? ?  ?  ? ?  ? ? ? ? ? ? ? ? ? ? ? ? ? ? ? ? ? ? ? ? ? ? ? ? ? PT Education - 04/24/21 1147   ? ? Education Details scar mobilization and very basic MLD for swelling superior to scar   ? Person(s) Educated Patient   ? Methods Explanation;Demonstration   ? Comprehension Verbalized understanding;Returned demonstration   ? ?  ?  ? ?  ? ? ? ? ? ? PT Long Term Goals - 04/24/21 1148   ? ?  ? PT LONG TERM GOAL #1  ? Title Pt will return to baseline cervical ROM measurements and not demonstrate any signs or symptoms of lymphedema.   ? Time 10   ? Period Weeks   ? Status Achieved   ?  ? PT LONG TERM GOAL #2  ? Title Pt will report improvement in feelings of dizziness when changing positions if related to vestibular issues.   ? Time 6   ? Period Weeks   ? Status New   ? Target Date 06/05/21   ? ?  ?  ? ?  ? ? ? ? ? ? ? ?  Plan - 04/24/21 1149   ? ? Clinical Impression Statement Pt retuns to PT after being on hold for a month to try exercising and improving fatigue levels independently. Pt reports he just recently increased the numbe

## 2021-04-25 ENCOUNTER — Other Ambulatory Visit: Payer: Self-pay

## 2021-04-25 ENCOUNTER — Ambulatory Visit: Payer: Medicare Other

## 2021-04-25 DIAGNOSIS — R600 Localized edema: Secondary | ICD-10-CM | POA: Diagnosis not present

## 2021-04-25 DIAGNOSIS — R1310 Dysphagia, unspecified: Secondary | ICD-10-CM

## 2021-04-25 NOTE — Therapy (Signed)
[facility] ?[address], [address] ?[place], [place], [address] ?Phone: [phone]   Fax:  [phone] ? ?Speech Language Pathology Treatment/Renewal note ? ?Patient Details  ?Name: Steven Mathews ?MRN: [id number] ?Date of Birth: [birth year] ?Referring Provider (SLP): Eppie Gibson, MD ? ? ?Encounter Date: 04/25/2021 ? ? End of Session - 04/25/21 1310   ? ? Visit Number 4   ? Number of Visits 5   ? Date for SLP Re-Evaluation 06/25/21   ? SLP Start Time 1237   ? SLP Stop Time  1305   ? SLP Time Calculation (min) 28 min   ? Activity Tolerance Patient tolerated treatment well   ? ?  ?  ? ?  ? ? ?Past Medical History:  ?Diagnosis Date  ? Atrial fibrillation ([place])   ? CAD (coronary artery disease)   ? 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  ? Chronic cough   ? GERD (gastroesophageal reflux disease)   ? Hx of adenomatous colonic polyps 04/04/2016  ? Hyperlipidemia   ? Hypertension   ? Myocardial infarct [facility])   ? Presence of permanent cardiac pacemaker   ? SVT (supraventricular tachycardia) ([place])   ? ? ?Past Surgical History:  ?Procedure Laterality Date  ? CORONARY ARTERY BYPASS GRAFT  03/2006  ? DIRECT LARYNGOSCOPY N/A 12/11/2020  ? Procedure: DIRECT LARYNGOSCOPY WITH BIOPSY;  Surgeon: Izora Gala, MD;  Location: [place];  Service: ENT;  Laterality: N/A;  ? ENDOBRONCHIAL ULTRASOUND N/A 01/08/2021  ? Procedure: ENDOBRONCHIAL ULTRASOUND;  Surgeon: [name], MD;  Location: [facility];  Service: Pulmonary;  Laterality: N/A;  ? ESOPHAGOSCOPY N/A 12/11/2020  ? Procedure: ESOPHAGOSCOPY;  Surgeon: Izora Gala, MD;  Location: [place];  Service: ENT;  Laterality: N/A;  ? FINE NEEDLE ASPIRATION  01/08/2021  ? Procedure: FINE NEEDLE ASPIRATION;  Surgeon: [name], MD;  Location: [facility];  Service: Pulmonary;;  ? [place]  ? IR GASTROSTOMY TUBE MOD SED  02/06/2021  ? LEFT HEART CATH AND CORS/GRAFTS ANGIOGRAPHY N/A  03/13/2018  ? Procedure: LEFT HEART CATH AND CORS/GRAFTS ANGIOGRAPHY;  Surgeon: Troy Sine, MD;  Location: [place] CV LAB;  Service: Cardiovascular;  Laterality: N/A;  ? PACEMAKER IMPLANT N/A 06/26/2020  ? Procedure: PACEMAKER IMPLANT;  Surgeon: Evans Lance, MD;  Location: [place] CV LAB;  Service: Cardiovascular;  Laterality: N/A;  ? RADICAL NECK DISSECTION Right 12/11/2020  ? Procedure: RIGHT MODIFIED NECK DISSECTION;  Surgeon: Izora Gala, MD;  Location: [place];  Service: ENT;  Laterality: Right;  ? SUPRAVENTRICULAR TACHYCARDIA ABLATION N/A 09/03/2012  ? Procedure: SUPRAVENTRICULAR TACHYCARDIA ABLATION;  Surgeon: Evans Lance, MD;  Location: [facility] CATH LAB;  Service: Cardiovascular;  Laterality: N/A;  ? TONSILLECTOMY Bilateral 12/11/2020  ? Procedure: BILATERAL TONSILLECTOMY;  Surgeon: Izora Gala, MD;  Location: [place];  Service: ENT;  Laterality: Bilateral;  ? VIDEO BRONCHOSCOPY  01/08/2021  ? Procedure: VIDEO BRONCHOSCOPY WITHOUT FLUORO;  Surgeon: [name], MD;  Location: [facility];  Service: Pulmonary;;  ? ? ?There were no vitals filed for this visit. ? ? Subjective Assessment - 04/25/21 1242   ? ? Subjective Pt relates thick saliva is less frequent than last session; especially in last week   ? Currently in Pain? Yes   ? Pain Score 2    ? Pain Location Throat   ? Pain Orientation Right   ? Pain Descriptors / Indicators Sore   ?  Pain Type Acute pain   ? ?  ?  ? ?  ? ? ? ? ? ? ? ? ADULT SLP TREATMENT - 04/25/21 1243   ? ?  ? General Information  ? Behavior/Cognition Alert;Cooperative;Pleasant mood   ?  ? Treatment Provided  ? Treatment provided Dysphagia   ?  ? Dysphagia Treatment  ? Type of PO's observed Dysphagia 1 (puree);Thin liquids   ? Oral Phase Signs & Symptoms Other (comment)   no overt s/sx difficulty  ? Pharyngeal Phase Signs & Symptoms Other (comment)   no overt s/sx difficulty  ? Other treatment/comments Pt reports once a day completion of HEP approx 3 days/week. He did  not bring copy of HEP as requested last visit so SLP printed off another copy for pt. SLP asked pt for rationale for HEP and he req'd total A. By session end he could state rationale for HEP independently. His procedure for HEP was WNL without SLP cues. SLP encouraged pt to cont to try foods and keep track of what he has tried (food journal).   ?  ? Assessment / Recommendations / Plan  ? Plan --   2 months follow up due to WNL HEP procedure and safe swallowing with dys I-II items  ?  ? Dysphagia Recommendations  ? Diet recommendations Dysphagia 1 (puree);Dysphagia 2 (fine chop);Thin liquid   ?  ? Progression Toward Goals  ? Progression toward goals Progressing toward goals   ? ?  ?  ? ?  ? ? ? SLP Education - 04/25/21 1310   ? ? Education Details see note   ? Person(s) Educated Patient   ? Methods Explanation;Handout   ? Comprehension Verbalized understanding   ? ?  ?  ? ?  ? ? ? ?SLP Short Term Goals - 04/25/21    ?  ?       ?       ?  SLP SHORT TERM GOAL #1  ?  Title pt will complete HEP with rare min A   ?  Time    ?  Period --   visits, for all STGs  ?  Status Achieved  ?  Target Date 02/16/21   ?       ?  SLP SHORT TERM GOAL #2  ?  Title pt will tell SLP why pt is completing HEP with modified independence   ?  Time      ?  Status Achieved  ?  Target Date 03/16/21   ?       ?  SLP SHORT TERM GOAL #3  ?  Title ?  ?Status pt will describe 3 overt s/s aspiration PNA with modified independence  ?Achieved  ?  Time    ?  Target Date 04/13/21   ?      ?      ?     ?SLP SHORT TERM GOAL #4  ?Title pt will tell SLP how a food journal can facilitate quicker return to more normal POs following rad tx  ?Time ?Status   ?Achieved  ?Target Date 04/13/21   ?  ?       ?  ?  ?   ?  ?  ?  SLP Long Term Goals - 04/25/21    ?  ?       ?       ?  SLP LONG TERM GOAL #1  ?  Title pt will complete HEP with modified  independence in 2 sessions   ?  Time       03/28/21  ?  Period --   visits, for all LTGs  ?  Status Achieved  ?  Target Date  05/18/21   ?       ?  SLP LONG TERM GOAL #2  ?  Title pt will describe how to modify HEP over time, and the timeline associated with reduction in HEP frequency with modified independence over two sessions   ?  Time ?Status 2 ?Ongoing  ?  Target Date 06/22/21   ?  ?   ?  ?  ?   ?  ?  ?  Plan - 04/25/21    ?  ?  Clinical Impression Statement Pt's swallow deemed WFL/WNL for dys I-II items and thin liquids based upon pt's performance today. SLP reviewed pt's individualized HEP for dysphagia and pt completed each exercise on their own with modified independence. [name] continues to perform the HEP at suboptimal frequency. SLP strongly reiterated to pt to complete to recommended frequency and scope. There are no overt s/s aspiration reported by pt at this time. Data indicate that pt's swallow ability could very well decline over time following conclusion of their radiation therapy due to muscle disuse atrophy and/or muscle fibrosis. Pt will cont to need to be seen by SLP in order to assess safety of PO intake, assess the need for recommending any objective swallow assessment, and ensuring pt correctly completes the individualized HEP. If pt's swallowing cont to look WFL/WNL and his procedure looks WNL for HEP, he may be d/c'd after his visit in May 2023.   ?  Speech Therapy Frequency  once approx every 8 weeks  ?  Duration 61 days  ?  Treatment/Interventions Aspiration precaution training;Pharyngeal strengthening exercises;Diet toleration management by SLP;Compensatory techniques;SLP instruction and feedback;Patient/family education;Compensatory strategies   ?  Potential to Achieve Goals Good   ?  SLP Home Exercise Plan provided today   ?  Consulted and Agree with Plan of Care Patient  ?  ? ? ? ? ? ? ?Patient will benefit from skilled therapeutic intervention in order to improve the following deficits and impairments:   ?Dysphagia, unspecified type ? ? ? ?Problem List ?Patient Active Problem List  ? Diagnosis Date Noted  ?  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 Encounter for fitting or adjustment of dental prosthetic device 01/10/2021  ? Hilar lymphadenopathy   ? Encounter for preoperative dental examination 01/02/2021  ? Too

## 2021-04-25 NOTE — Patient Instructions (Signed)
SWALLOWING EXERCISES ?Do these until 6 months after your last day of radiation, then 2-3 times per week afterwards ?  ?Effortful Swallows ?- Press your tongue against the roof of your mouth for 3 seconds, then squeeze the muscles in your neck while you swallow your saliva or a sip of water ?- Repeat 10-15 times, 2-3 times a day, and use whenever you eat or drink ?  ?Masako Swallow - swallow with your tongue sticking out ?- Stick tongue out past your lips and gently bite tongue with your teeth ?- Swallow, while holding your tongue with your teeth ?- Repeat 10-15 times, 2-3 times a day ?*use a wet spoon if your mouth gets dry* ?  ?Pitch Raise ?- Repeat ?he?, once per second in as high of a pitch as you can ?- Repeat 20 times, 2-3 times a day ?  ?Shaker Exercise - head lift ?- Lie flat on your back in your bed or on a couch without pillows ?- Raise your head and look at your feet - KEEP YOUR SHOULDERS DOWN ?- HOLD FOR 45-60 SECONDS, then lower your head back down ?- Repeat 3 times, 2-3 times a day ?  ?Mendelsohn Maneuver - ?half swallow? exercise ?- Start to swallow, and keep your Adam?s apple up by squeezing hard with the muscles of the throat ?- Hold the squeeze for 5-7 seconds and then relax ?- Repeat 10-15 times, 2-3 times a day ?*use a wet spoon if your mouth gets dry* ?

## 2021-04-27 ENCOUNTER — Other Ambulatory Visit: Payer: Self-pay

## 2021-05-04 ENCOUNTER — Other Ambulatory Visit (HOSPITAL_COMMUNITY): Payer: Medicare (Managed Care) | Admitting: Dentistry

## 2021-05-09 ENCOUNTER — Encounter: Payer: Self-pay | Admitting: Rehabilitation

## 2021-05-09 ENCOUNTER — Ambulatory Visit: Payer: Medicare Other | Attending: Radiation Oncology | Admitting: Rehabilitation

## 2021-05-09 DIAGNOSIS — R42 Dizziness and giddiness: Secondary | ICD-10-CM | POA: Diagnosis not present

## 2021-05-09 DIAGNOSIS — C01 Malignant neoplasm of base of tongue: Secondary | ICD-10-CM | POA: Diagnosis not present

## 2021-05-09 DIAGNOSIS — R600 Localized edema: Secondary | ICD-10-CM | POA: Diagnosis not present

## 2021-05-09 DIAGNOSIS — Z9181 History of falling: Secondary | ICD-10-CM | POA: Insufficient documentation

## 2021-05-09 DIAGNOSIS — R293 Abnormal posture: Secondary | ICD-10-CM | POA: Diagnosis not present

## 2021-05-09 DIAGNOSIS — L599 Disorder of the skin and subcutaneous tissue related to radiation, unspecified: Secondary | ICD-10-CM

## 2021-05-09 NOTE — Therapy (Addendum)
OUTPATIENT PHYSICAL THERAPY VESTIBULAR EVALUATION     Patient Name: Steven Mathews MRN: [id number] DOB:October 22, 1945, 76 y.o., male Today's Date: 05/09/2021  PCP: Cathlean Sauer, MD REFERRING PROVIDER: Eppie Gibson, MD   PT End of Session - 05/09/21 1111     Visit Number 4    Number of Visits 7    Date for PT Re-Evaluation 06/05/21    PT Start Time 1109    PT Stop Time 1140    PT Time Calculation (min) 31 min    Activity Tolerance Patient tolerated treatment well    Behavior During Therapy [facility] for tasks assessed/performed             Past Medical History:  Diagnosis Date   Atrial fibrillation ([place])    CAD (coronary artery disease)    CABG 2008; 03/13/2018 NSTEMI due to acute thrombotic occlusion of RCA graft, patent LIMA to LAD.   Chronic cough    GERD (gastroesophageal reflux disease)    Hx of adenomatous colonic polyps 04/04/2016   Hyperlipidemia    Hypertension    Myocardial infarct [facility])    Presence of permanent cardiac pacemaker    SVT (supraventricular tachycardia) ([place])    Past Surgical History:  Procedure Laterality Date   CORONARY ARTERY BYPASS GRAFT  03/2006   DIRECT LARYNGOSCOPY N/A 12/11/2020   Procedure: DIRECT LARYNGOSCOPY WITH BIOPSY;  Surgeon: Izora Gala, MD;  Location: [place];  Service: ENT;  Laterality: N/A;   ENDOBRONCHIAL ULTRASOUND N/A 01/08/2021   Procedure: ENDOBRONCHIAL ULTRASOUND;  Surgeon: [name], MD;  Location: [facility];  Service: Pulmonary;  Laterality: N/A;   ESOPHAGOSCOPY N/A 12/11/2020   Procedure: ESOPHAGOSCOPY;  Surgeon: Izora Gala, MD;  Location: [place];  Service: ENT;  Laterality: N/A;   FINE NEEDLE ASPIRATION  01/08/2021   Procedure: FINE NEEDLE ASPIRATION;  Surgeon: [name], MD;  Location: [facility];  Service: Pulmonary;;   HERNIA REPAIR  1964   IR GASTROSTOMY TUBE MOD SED  02/06/2021   LEFT HEART CATH AND CORS/GRAFTS ANGIOGRAPHY N/A 03/13/2018   Procedure: LEFT HEART CATH AND CORS/GRAFTS ANGIOGRAPHY;   Surgeon: Troy Sine, MD;  Location: [place] CV LAB;  Service: Cardiovascular;  Laterality: N/A;   PACEMAKER IMPLANT N/A 06/26/2020   Procedure: PACEMAKER IMPLANT;  Surgeon: Evans Lance, MD;  Location: [place] CV LAB;  Service: Cardiovascular;  Laterality: N/A;   RADICAL NECK DISSECTION Right 12/11/2020   Procedure: RIGHT MODIFIED NECK DISSECTION;  Surgeon: Izora Gala, MD;  Location: [place];  Service: ENT;  Laterality: Right;   SUPRAVENTRICULAR TACHYCARDIA ABLATION N/A 09/03/2012   Procedure: SUPRAVENTRICULAR TACHYCARDIA ABLATION;  Surgeon: Evans Lance, MD;  Location: [facility] CATH LAB;  Service: Cardiovascular;  Laterality: N/A;   TONSILLECTOMY Bilateral 12/11/2020   Procedure: BILATERAL TONSILLECTOMY;  Surgeon: Izora Gala, MD;  Location: [place];  Service: ENT;  Laterality: Bilateral;   VIDEO BRONCHOSCOPY  01/08/2021   Procedure: VIDEO BRONCHOSCOPY WITHOUT FLUORO;  Surgeon: [name], MD;  Location: [facility];  Service: Pulmonary;;   Patient Active Problem List   Diagnosis Date Noted   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   Encounter for fitting or adjustment of dental prosthetic device 01/10/2021   Hilar lymphadenopathy    Encounter for preoperative dental examination 01/02/2021   Tooth missing 01/02/2021   Gingival recession, generalized 01/02/2021   Defective dental restoration 01/02/2021   Dental attrition, excessive 01/02/2021   Diastema 01/02/2021   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  Pacemaker 12/19/2020   Neck mass 12/11/2020   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   Bradycardia 08/01/2020   Heart block 06/13/2020   Therapeutic drug monitoring 09/16/2018   Rash 05/25/2018   Essential hypertension 05/25/2018   Educated about COVID-19 virus infection 05/25/2018   Acute ST elevation myocardial infarction (STEMI) due to occlusion of right coronary artery ([place]) 03/13/2018   Hx of CABG    Hx  of adenomatous colonic polyps 04/04/2016   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) 04/14/2008   PULMONARY INFILTRATE INCLUDES (EOSINOPHILIA) 04/14/2008   Pure hypercholesterolemia 04/14/2008    ONSET DATE: 12/11/20  REFERRING DIAG: dizziness; malignant neoplasm of base of tongue  THERAPY DIAG:  History of falling  Dizziness and giddiness  Localized edema  Disorder of the skin and subcutaneous tissue related to radiation, unspecified  Abnormal posture  Malignant neoplasm of base of tongue ([facility])  SUBJECTIVE:   SUBJECTIVE STATEMENT: When I wake up I have to sit to get steady and then stand up.  I have had some vertigo from [place] so I have had it before. I doesn't feel like that.    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 Constance Holster in consult and he performed a FNA of a right neck mass revealing SCC, 11/29/20 PET revealed findings suspicious for neoplasm of the head and neck with right neck adenopathy. Asymmetric uptake at the level of the oropharyngeal tonsillar tissue in base of tongue was appreciated as well, noted to be greatest on the left. Moderate increased metabolic activity was also seen associated with a (presumed) lymph node at the right hilum, 12/11/20 Dr. Constance Holster completed multiple biopsies and excisions. 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 Left tongue base biopsy: non-keratinizing SCC, right tongue base, negative for carcinoma, left and right tonsil excisions, negative for  carcinoma., 01/08/21 Biopsy of right hilar lymphadenopathy- negative for carcinoma, will receive 35 fractions of radiation to his Base of tongue and bilateral neck. He will start on 12/19 and complete 03/13/21.    PAIN:  Are you having pain? No  PRECAUTIONS: risk of lymphedema/ pacemaker   WEIGHT BEARING RESTRICTIONS No  FALLS: Has patient fallen in last 6 months? Yes 1 - see previous note   LIVING ENVIRONMENT: Lives with: lives with their family  PLOF: Independent  PATIENT GOALS vestibular check  OBJECTIVE:  VESTIBULAR ASSESSMENT    SYMPTOM BEHAVIOR:   Subjective history: see above   Non-Vestibular symptoms: changes in hearing and changes in vision   Type of dizziness: Imbalance (Disequilibrium)   Frequency: occasionally going from supine to stand   Duration: maybe 30sec     OCULOMOTOR EXAM:   Ocular Alignment: normal   Ocular ROM: No Limitations   Spontaneous Nystagmus: absent   Gaze-Induced Nystagmus: age appropriate nystagmus at end range   Smooth Pursuits: intact   Saccades: intact   Convergence/Divergence: 8 cm     VESTIBULAR - OCULAR REFLEX:    Slow VOR: Normal   VOR Cancellation: Normal   Head-Impulse Test: normal       POSITIONAL TESTING: horizonal roll neg bil,  DH negative bil   MOTION SENSITIVITY:    Motion Sensitivity Quotient  Intensity: 0 = none, 1 = Lightheaded, 2 = Mild, 3 = Moderate, 4 = Severe, 5 = Vomiting  Intensity  1. Sitting to supine 0  2. Supine to L side 0  3. Supine to R side 0  4. Supine to sitting 0  5. L Hallpike-Dix 0  6. Up from L  0  7. R Hallpike-Dix 0  8. Up from R  0  9. Sitting, head  tipped to L knee   10. Head up from L  knee   11. Sitting, head  tipped to R knee   12. Head up from R  knee   13. Sitting head turns x5 0  14.Sitting head nods x5 0  15. In stance, 180  turn to L    16. In stance, 180  turn to R      VESTIBULAR TREATMENT: PATIENT EDUCATION: Education details: continuing slow supine to  stand to acclimate  Person educated: Patient Education method: Explanation Education comprehension: verbalized understanding   GOALS: Goals reviewed with patient? Yes  SHORT TERM GOALS: Target date: 05/09/21  Pt will feel safe with gait and supine to stand at home Baseline: Goal status: MET   ASSESSMENT:  CLINICAL IMPRESSION: Patient is a 76 y.o. male who was seen today for physical therapy evaluation of dizzness noted during his lymphedema treatments. He has been feeling better overall since that visit 2-3 weeks ago.  He reports he is starting to gain weight back and feeling better overall.  He does not have any positive vestibular testing today but has signs consistent with unsteadiness from position changes at night mainly.  Pt reports he has managed this from moving slowly and feels good overall.     OBJECTIVE IMPAIRMENTS dizziness.   ACTIVITY LIMITATIONS none  PLAN: PT FREQUENCY: no visits during lymphedema needed for dizziness    Stark Bray, PT 05/09/2021, 11:57 AM   PHYSICAL THERAPY DISCHARGE SUMMARY  Visits from Start of Care: 4  Current functional level related to goals / functional outcomes: See above   Remaining deficits: lymphedema   Education / Equipment: Final care  Plan: Patient agrees to discharge.  Patient is being discharged due to meeting the stated rehab goals.

## 2021-05-30 ENCOUNTER — Telehealth: Payer: Self-pay | Admitting: *Deleted

## 2021-05-30 NOTE — Telephone Encounter (Signed)
Called patient to inform of Pet Scan for 06-11-21- arrival time- 8 am @ [facility], patient to have water only- 6 hrs. prior to test, patient to receive results from Dr. Isidore Moos on 06-12-21 @ 2:20 pm, spoke with patient and he is aware of these appts ?

## 2021-06-11 ENCOUNTER — Ambulatory Visit (HOSPITAL_COMMUNITY)
Admission: RE | Admit: 2021-06-11 | Discharge: 2021-06-11 | Disposition: A | Discharge status: Home / Self Care | Payer: Medicare Other | Source: Ambulatory Visit | Attending: Radiation Oncology | Admitting: Radiation Oncology

## 2021-06-11 DIAGNOSIS — C01 Malignant neoplasm of base of tongue: Secondary | ICD-10-CM | POA: Diagnosis present

## 2021-06-11 LAB — GLUCOSE, CAPILLARY: Glucose-Capillary: 116 mg/dL — ABNORMAL HIGH (ref 70–99)

## 2021-06-11 MED ORDER — FLUDEOXYGLUCOSE F - 18 (FDG) INJECTION
8.0000 | Freq: Once | INTRAVENOUS | Status: AC | PRN
  Administered 2021-06-11: 7.9 via INTRAVENOUS

## 2021-06-11 NOTE — Progress Notes (Signed)
Steven Mathews presents today for follow-up after completing radiation to his base of tongue on 03/13/2021 and to review PET scan results from 06/11/2021 ? ?Pain issues, if any: Patient denies throat or mouth pain, or ear or jaw pain. ?Using a feeding tube?: Yes--continues to supplement with [place] ?Weight changes, if any:  ?Wt Readings from Last 3 Encounters:  ?06/12/21 161 lb 12.8 oz (73.4 kg)  ?04/23/21 158 lb 9.6 oz (71.9 kg)  ?03/30/21 163 lb 9.6 oz (74.2 kg)  ? ?Swallowing issues, if any: Able to tolerate softer foods and liquids. Last saw Garald Balding on 04/25/2021: "Pt's swallow deemed WFL/WNL for dys I-II items and thin liquids based upon pt's performance today. SLP reviewed pt's individualized HEP for dysphagia and pt completed each exercise on their own with modified independence. [name] continues to perform the HEP at suboptimal frequency. SLP strongly reiterated to pt to complete to recommended frequency and scope. There are no overt s/s aspiration reported by pt at this time." ?Smoking or chewing tobacco? None ?Using fluoride trays daily? N/A--plans to follow-up with Dr. Benson Norway later in the year; still reports a sore area in the upper back right area of his maxilla from a tooth was extracted before radiation ?Last ENT visit was on: Not since diagnosis ?Other notable issues, if any: Continues to deal with dry mouth and thick phlegm. Reports fatigue is slowly improving. Reports continued numbness/stiffness to the right side of his neck, but reports good range of motion. Overall reports he's doing well and content with his current progress ? ? ? ?

## 2021-06-12 ENCOUNTER — Ambulatory Visit
Admission: RE | Admit: 2021-06-12 | Discharge: 2021-06-12 | Disposition: A | Discharge status: Home / Self Care | Payer: Medicare Other | Source: Ambulatory Visit | Attending: Radiation Oncology | Admitting: Radiation Oncology

## 2021-06-12 ENCOUNTER — Inpatient Hospital Stay: Payer: PRIVATE HEALTH INSURANCE | Attending: Radiation Oncology | Admitting: Nutrition

## 2021-06-12 ENCOUNTER — Other Ambulatory Visit: Payer: Self-pay

## 2021-06-12 VITALS — BP 100/65 | HR 92 | Temp 97.8°F | Resp 20 | Ht 72.0 in | Wt 161.8 lb

## 2021-06-12 DIAGNOSIS — I700 Atherosclerosis of aorta: Secondary | ICD-10-CM | POA: Insufficient documentation

## 2021-06-12 DIAGNOSIS — C770 Secondary and unspecified malignant neoplasm of lymph nodes of head, face and neck: Secondary | ICD-10-CM | POA: Insufficient documentation

## 2021-06-12 DIAGNOSIS — Z7982 Long term (current) use of aspirin: Secondary | ICD-10-CM | POA: Insufficient documentation

## 2021-06-12 DIAGNOSIS — Z79899 Other long term (current) drug therapy: Secondary | ICD-10-CM | POA: Insufficient documentation

## 2021-06-12 DIAGNOSIS — C01 Malignant neoplasm of base of tongue: Secondary | ICD-10-CM | POA: Insufficient documentation

## 2021-06-13 ENCOUNTER — Encounter: Payer: Self-pay | Admitting: Radiation Oncology

## 2021-06-13 NOTE — Progress Notes (Signed)
?Radiation Oncology         [phone] ?________________________________ ? ?Name: Steven Mathews MRN: [id number]  ?Date: 06/12/2021  DOB: 02-09-1945 ? ?Follow-Up Visit Note ? ?Outpatient ? ?CC: Cathlean Sauer, MD  Izora Gala, MD ? ?Diagnosis and Prior Radiotherapy:  ?  ICD-10-CM   ?1. Malignant neoplasm of base of tongue (HCC)  C01   ?  ?2. 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  ? Cancer Staging  ?Malignant neoplasm of base of tongue ([place]) ?Staging form: Pharynx - HPV-Mediated Oropharynx, AJCC 8th Edition ?- Clinical stage from 12/27/2020: Stage II (cT1, cN2, cM0, p16+) - Signed by Eppie Gibson, MD on 12/27/2020 ?Stage prefix: Initial diagnosis ? ? ?CHIEF COMPLAINT: Here for follow-up and surveillance of throat cancer ? ?Narrative:  Steven Mathews presents today for follow-up after completing radiation to his base of tongue on 03/13/2021 and to review PET scan results from 06/11/2021 ? ?Pain issues, if any: Patient denies throat or mouth pain, or ear or jaw pain. ?Using a feeding tube?: Yes--continues to supplement with Dillard [place] ?Weight changes, if any:  ?Wt Readings from Last 3 Encounters:  ?06/12/21 161 lb 12.8 oz (73.4 kg)  ?04/23/21 158 lb 9.6 oz (71.9 kg)  ?03/30/21 163 lb 9.6 oz (74.2 kg)  ? ?Swallowing issues, if any: Able to tolerate softer foods and liquids. Last saw Garald Balding on 04/25/2021: "Pt's swallow deemed WFL/WNL for dys I-II items and thin liquids based upon pt's performance today. SLP reviewed pt's individualized HEP for dysphagia and pt completed each exercise on their own with modified independence. [name] continues to perform the HEP at suboptimal frequency. SLP strongly reiterated to pt to complete to recommended frequency and scope. There are no overt s/s aspiration reported by pt at this time." ?Smoking or chewing tobacco? None ?Using fluoride trays daily? N/A--plans to follow-up with Dr. Benson Norway later in the year; still reports a sore area in the upper back right area of  his maxilla from a tooth was extracted before radiation ?Last ENT visit was on: Not since diagnosis ?Other notable issues, if any: Continues to deal with dry mouth and thick phlegm. Reports fatigue is slowly improving. Reports continued numbness/stiffness to the right side of his neck, but reports good range of motion. Overall reports he's doing well and content with his current progress ? ? ? ?ALLERGIES:  has No Known Allergies. ? ?Meds: ?Current Outpatient Medications  ?Medication Sig Dispense Refill  ? scopolamine (TRANSDERM-SCOP) 1 MG/3DAYS Place 1 patch (1.5 mg total) onto the skin every 3 (three) days. 10 patch 2  ? aspirin 81 MG chewable tablet Chew 1 tablet (81 mg total) by mouth daily. 90 tablet 3  ? HYDROcodone-acetaminophen (NORCO/VICODIN) 5-325 MG tablet Take 1 tablet by mouth 4 (four) times daily as needed for moderate pain. As pain improves, space out frequency of medication. 50 tablet 0  ? lidocaine (XYLOCAINE) 2 % solution Patient: Mix 1part 2% viscous lidocaine, 1part H20. Swish & swallow 4m of diluted mixture, 357m before meals and at bedtime, up to QID prn soreness. (Patient not taking: Reported on 02/01/2021) 200 mL 3  ? metoprolol succinate (TOPROL-XL) 50 MG 24 hr tablet Take 50 mg by mouth daily. Take with or immediately following a meal.    ? Nutritional Supplements (KATE FARMS STANDARD 1.4) LIQD 1 Bottle by Enteral route 5 (five) times daily. 1625 mL 6  ? omeprazole (PRILOSEC) 40 MG capsule Take 40 mg by mouth daily.    ?  ondansetron (ZOFRAN) 8 MG tablet Take 1 tablet (8 mg total) by mouth every 8 (eight) hours as needed for nausea or vomiting. 30 tablet 5  ? OVER THE COUNTER MEDICATION Take 2 tablets by mouth at bedtime as needed (sleep). Relaxium Sleep    ? phenol (CHLORASEPTIC) 1.4 % LIQD Use as directed 1 spray in the mouth or throat as needed for throat irritation / pain.    ? rosuvastatin (CRESTOR) 40 MG tablet Take 40 mg by mouth daily.    ? sildenafil (VIAGRA) 50 MG tablet Take 1  tablet (50 mg total) by mouth daily as needed for erectile dysfunction. (Patient not taking: Reported on 02/01/2021) 20 tablet 3  ? Wound Dressings (SONAFINE EX) Apply 1 application topically daily as needed (scar care).    ? ?No current facility-administered medications for this encounter.  ? ? ?Physical Findings: ?The patient is in no acute distress. Patient is alert and oriented. ? height is 6' (1.829 m) and weight is 161 lb 12.8 oz (73.4 kg). His temperature is 97.8 ?F (36.6 ?C). His blood pressure is 100/65 and his pulse is 92. His respiration is 20 and oxygen saturation is 100%. Marland Kitchen    ?General: Alert and oriented, in no acute distress ?HEENT: Head is normocephalic. Extraocular movements are intact. Oropharynx - no thrush or tumor.   ?Neck: Neck is supple, no palpable cervical or supraclavicular lymphadenopathy. ?Extremities: No cyanosis or edema. ?Lymphatics: see Neck Exam ?Skin: intact over neck, healed well ?Heart RRR ?Chest CTAB ?Psychiatric: Judgment and insight are intact. Affect is appropriate. ? ? ?Lab Findings: ?Lab Results  ?Component Value Date  ? WBC 5.7 02/06/2021  ? HGB 13.5 02/06/2021  ? HCT 39.4 02/06/2021  ? MCV 92.7 02/06/2021  ? PLT 179 02/06/2021  ? ? ?Radiographic Findings: ?NM PET Image Restag (PS) Skull Base To Thigh ? ?Result Date: 06/12/2021 ?CLINICAL DATA:  Subsequent treatment strategy for malignant neoplasm of base of tongue. EXAM: NUCLEAR MEDICINE PET SKULL BASE TO THIGH TECHNIQUE: 8.9 mCi F-18 FDG was injected intravenously. Full-ring PET imaging was performed from the skull base to thigh after the radiotracer. CT data was obtained and used for attenuation correction and anatomic localization. Fasting blood glucose: 116 mg/dl COMPARISON:  CT chest 12/12/2020.  PET-CT 11/29/2020. FINDINGS: Mediastinal blood pool activity: SUV max 2.26 Liver activity: SUV max NA NECK: There is physiologic tracer uptake within the tongue. The previously noted abnormal tracer uptake within the base of  tongue is improved from previous exam. The previously noted abnormal tracer uptake within the tongue base and left tonsillar region has resolved. No tracer avid cervical lymph nodes identified at this time. Incidental CT findings: None CHEST: No tracer avid supraclavicular or axillary lymph nodes. Increased radiotracer uptake is noted within the mediastinum and bilateral hilar regions. Right hilar lymph node has an SUV max of 5.85, image 83/4. Previously the SUV max within this area was equal to 4.33. SUV max within the left hilar region is equal to 3.47, image 79/4. Previously 3.38. Tracer avid low right paratracheal lymph node has an SUV max of 4.78, image 79/4. Previously 3.41. Within the limitations of unenhanced technique no corresponding enlarged mediastinal or hilar lymph nodes identified. No suspicious pulmonary nodules identified. Incidental CT findings: There is a left chest wall pacer device with leads in the right atrial appendage and right ventricle. Previous median sternotomy and CABG procedure. Calcified left hilar lymph nodes are identified. Aortic atherosclerosis. ABDOMEN/PELVIS: There is no abnormal tracer uptake within the liver,  pancreas, spleen, or adrenal glands. No tracer avid abdominopelvic lymph nodes. Incidental CT findings: Gastrostomy tube identified. Calcified granulomas in the liver and spleen. SKELETON: No signs of tracer avid bone metastases. Incidental CT findings: none IMPRESSION: 1. Interval resolution of previous abnormal FDG uptake associated with base of tongue and left tonsillar region. 2. Resolution of previous FDG avid cervical lymph nodes. 3. Again noted are tracer avid mediastinal and bilateral hilar lymph nodes. The degree of FDG uptake associated with these lymph nodes is stable to mildly increased compared with previous exam. For example, right hilar lymph node has an SUV max 5.85 on today's study, compared with 4.33 previously. Within the limitations of unenhanced  technique there are no corresponding enlarged mediastinal or hilar lymph nodes. Signs of chronic granulomatous disease are noted including calcified left hilar lymph nodes and calcified granulomas in this live

## 2021-06-13 NOTE — Progress Notes (Signed)
Oncology Nurse Navigator Documentation  ? ?Per patient's 06/12/21 post-treatment follow-up with Dr. Isidore Moos, sent fax to [facility] ENT Scheduling with request Mr. Mesenbrink be contacted and scheduled for routine post-RT follow-up with Dr. Constance Holster in 3 months.  Notification of successful fax transmission received.  ? ?Harlow Asa RN, BSN, OCN ?Head & Neck Oncology Nurse Navigator ?[place] at [facility] ?Phone # [phone]  ?Fax # [phone]  ?

## 2021-06-13 NOTE — Progress Notes (Signed)
Oncology Nurse Navigator Documentation  ? ?I met with [name] and his wife before and during his scheduled follow up with Dr. Isidore Moos for his PET results. He is doing well. He reports difficulty eating certain foods due to continued thick mucous to the back of his throat. He continues to use his feeding tube. He was educated on ways to decrease the use of his feeding tube and knows to call me when he's ready to have it removed based on our criteria. I will contact Dr. Glenetta Hew office for a follow up appointment along with Dr. Janeice Robinson office in 3 months. He also will see Dr. Benson Norway for a follow up appointment. He knows to call me with any questions going forward.  ? ?Harlow Asa RN, BSN, OCN ?Head & Neck Oncology Nurse Navigator ?[place] at [facility] ?Phone # [phone]  ?Fax # [phone]  ?

## 2021-06-18 ENCOUNTER — Other Ambulatory Visit: Payer: Self-pay

## 2021-06-18 DIAGNOSIS — Z1329 Encounter for screening for other suspected endocrine disorder: Secondary | ICD-10-CM

## 2021-06-18 NOTE — Progress Notes (Signed)
? ?Synopsis: Referred for right hilar adenopathy by Cathlean Sauer, MD ? ?Subjective:  ? ?PATIENT ID: Steven Mathews GENDER: male DOB: 03-20-1945, MRN: [id number] ? ?Chief Complaint  ?Patient presents with  ? Follow-up  ?  Pt c/o dry cough and congestion "in my throat" since finished RT approx 3 months ago.   ? ?20yM with history of recently discovered head/neck SCC, CAD, AF, s/p PPM, SVT. ? ?SCC discovered during workup of OP dysphagia, underwent right neck dissection and biopsy of left/right tongue base, excisional biopsy of tonsils. Preceding PET/CT showed hypermetabolic right hilar lymph node for which he was referred to [place].  ? ?He says he's overall doing well. Still has sore throat after surgery. No hemoptysis. Still has some trouble swallowing which he attributes to sore throat.  ? ?Interval HPI: ?Referred back for evaluation of thoracic LNs ? ?He says that his only problem is congestion in his throat. A great deal of dryness in the back of his throat and postnasal drainage. Combination of drainage and dry mouth make it hard for him to swallow - has to drink plenty of water. Minor sinus congestion.  ? ?Otherwise pertinent review of systems is negative. ? ?Past Medical History:  ?Diagnosis Date  ? Atrial fibrillation ([place])   ? CAD (coronary artery disease)   ? 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  ? Chronic cough   ? GERD (gastroesophageal reflux disease)   ? Hx of adenomatous colonic polyps 04/04/2016  ? Hyperlipidemia   ? Hypertension   ? Myocardial infarct [facility])   ? Presence of permanent cardiac pacemaker   ? SVT (supraventricular tachycardia) ([place])   ?  ? ?Family History  ?Problem Relation Age of Onset  ? Liver cancer Father   ? Cancer Mother   ?     bladder cancer  ? Colon cancer Brother 74  ?  ? ?Past Surgical History:  ?Procedure Laterality Date  ? CORONARY ARTERY BYPASS GRAFT  03/2006  ? DIRECT LARYNGOSCOPY N/A 12/11/2020  ? Procedure: DIRECT  LARYNGOSCOPY WITH BIOPSY;  Surgeon: Izora Gala, MD;  Location: [place];  Service: ENT;  Laterality: N/A;  ? ENDOBRONCHIAL ULTRASOUND N/A 01/08/2021  ? Procedure: ENDOBRONCHIAL ULTRASOUND;  Surgeon: [name], MD;  Location: [facility];  Service: Pulmonary;  Laterality: N/A;  ? ESOPHAGOSCOPY N/A 12/11/2020  ? Procedure: ESOPHAGOSCOPY;  Surgeon: Izora Gala, MD;  Location: [place];  Service: ENT;  Laterality: N/A;  ? FINE NEEDLE ASPIRATION  01/08/2021  ? Procedure: FINE NEEDLE ASPIRATION;  Surgeon: [name], MD;  Location: [facility];  Service: Pulmonary;;  ? [place]  ? IR GASTROSTOMY TUBE MOD SED  02/06/2021  ? LEFT HEART CATH AND CORS/GRAFTS ANGIOGRAPHY N/A 03/13/2018  ? Procedure: LEFT HEART CATH AND CORS/GRAFTS ANGIOGRAPHY;  Surgeon: Troy Sine, MD;  Location: [place] CV LAB;  Service: Cardiovascular;  Laterality: N/A;  ? PACEMAKER IMPLANT N/A 06/26/2020  ? Procedure: PACEMAKER IMPLANT;  Surgeon: Evans Lance, MD;  Location: [place] CV LAB;  Service: Cardiovascular;  Laterality: N/A;  ? RADICAL NECK DISSECTION Right 12/11/2020  ? Procedure: RIGHT MODIFIED NECK DISSECTION;  Surgeon: Izora Gala, MD;  Location: [place];  Service: ENT;  Laterality: Right;  ? SUPRAVENTRICULAR TACHYCARDIA ABLATION N/A 09/03/2012  ? Procedure: SUPRAVENTRICULAR TACHYCARDIA ABLATION;  Surgeon: Evans Lance, MD;  Location: [facility] CATH LAB;  Service: Cardiovascular;  Laterality: N/A;  ? TONSILLECTOMY Bilateral 12/11/2020  ? Procedure:  BILATERAL TONSILLECTOMY;  Surgeon: Izora Gala, MD;  Location: [place];  Service: ENT;  Laterality: Bilateral;  ? VIDEO BRONCHOSCOPY  01/08/2021  ? Procedure: VIDEO BRONCHOSCOPY WITHOUT FLUORO;  Surgeon: [name], MD;  Location: [facility];  Service: Pulmonary;;  ? ? ?Social History  ? ?Socioeconomic History  ? Marital status: Married  ?  Spouse name: Not on file  ? Number of children: Not on file  ? Years of education: Not on file  ? Highest education level:  Not on file  ?Occupational History  ? Not on file  ?Tobacco Use  ? Smoking status: Never  ? Smokeless tobacco: Never  ?Vaping Use  ? Vaping Use: Never used  ?Substance and Sexual Activity  ? Alcohol use: Yes  ?  Comment: 2-3 glasses wine/day  ? Drug use: No  ? Sexual activity: Not on file  ?Other Topics Concern  ? Not on file  ?Social History Narrative  ? Right handed  ? Two story home  ? Drinks caffeine  ? ?Social Determinants of Health  ? ?Financial Resource Strain: Low Risk   ? Difficulty of Paying Living Expenses: Not hard at all  ?Food Insecurity: No Food Insecurity  ? Worried About Charity fundraiser in the Last Year: Never true  ? Ran Out of Food in the Last Year: Never true  ?Transportation Needs: No Transportation Needs  ? Lack of Transportation (Medical): No  ? Lack of Transportation (Non-Medical): No  ?Physical Activity: Not on file  ?Stress: No Stress Concern Present  ? Feeling of Stress : Not at all  ?Social Connections: Unknown  ? Frequency of Communication with Friends and Family: More than three times a week  ? Frequency of Social Gatherings with Friends and Family: More than three times a week  ? Attends Religious Services: More than 4 times per year  ? Active Member of Clubs or Organizations: Not on file  ? Attends Archivist Meetings: Not on file  ? Marital Status: Married  ?Intimate Partner Violence: Not on file  ?  ? ?No Known Allergies  ? ?Outpatient Medications Prior to Visit  ?Medication Sig Dispense Refill  ? guaiFENesin (MUCINEX) 600 MG 12 hr tablet Take 600 mg by mouth 2 (two) times daily as needed.    ? aspirin 81 MG chewable tablet Chew 1 tablet (81 mg total) by mouth daily. 90 tablet 3  ? metoprolol succinate (TOPROL-XL) 50 MG 24 hr tablet Take 50 mg by mouth daily. Take with or immediately following a meal.    ? Nutritional Supplements (KATE FARMS STANDARD 1.4) LIQD 1 Bottle by Enteral route 5 (five) times daily. 1625 mL 6  ? rosuvastatin (CRESTOR) 40 MG tablet Take 40 mg by  mouth daily.    ? HYDROcodone-acetaminophen (NORCO/VICODIN) 5-325 MG tablet Take 1 tablet by mouth 4 (four) times daily as needed for moderate pain. As pain improves, space out frequency of medication. 50 tablet 0  ? lidocaine (XYLOCAINE) 2 % solution Patient: Mix 1part 2% viscous lidocaine, 1part H20. Swish & swallow 72m of diluted mixture, 376m before meals and at bedtime, up to QID prn soreness. (Patient not taking: Reported on 02/01/2021) 200 mL 3  ? omeprazole (PRILOSEC) 40 MG capsule Take 40 mg by mouth daily.    ? ondansetron (ZOFRAN) 8 MG tablet Take 1 tablet (8 mg total) by mouth every 8 (eight) hours as needed for nausea or vomiting. 30 tablet 5  ? OVER THE COUNTER MEDICATION Take 2 tablets  by mouth at bedtime as needed (sleep). Relaxium Sleep    ? phenol (CHLORASEPTIC) 1.4 % LIQD Use as directed 1 spray in the mouth or throat as needed for throat irritation / pain.    ? scopolamine (TRANSDERM-SCOP) 1 MG/3DAYS Place 1 patch (1.5 mg total) onto the skin every 3 (three) days. 10 patch 2  ? sildenafil (VIAGRA) 50 MG tablet Take 1 tablet (50 mg total) by mouth daily as needed for erectile dysfunction. (Patient not taking: Reported on 02/01/2021) 20 tablet 3  ? Wound Dressings (SONAFINE EX) Apply 1 application topically daily as needed (scar care).    ? ?No facility-administered medications prior to visit.  ? ? ? ? ? ?Objective:  ? ?Physical Exam: ? ?General appearance: 76 y.o., male, NAD, conversant  ?Eyes: anicteric sclerae, moist conjunctivae; no lid-lag; PERRL, tracking appropriately ?HENT: NCAT; oropharynx, MMM, no mucosal ulcerations; normal hard and soft palate ?Neck: Trachea midline; no lymphadenopathy, no JVD. Incision over R neck apparetnly healing well ?Lungs: CTAB, no crackles, no wheeze, with normal respiratory effort ?CV: RRR, no MRGs  ?Abdomen: Soft, non-tender; non-distended, BS present  ?Extremities: No peripheral edema, radial and DP pulses present bilaterally  ?Skin: Normal temperature,  turgor and texture; no rash ?Psych: Appropriate affect ?Neuro: Alert and oriented to person and place, no focal deficit  ? ? ?Vitals:  ? 06/20/21 1622  ?BP: (!) 98/54  ?Pulse: 89  ?Temp: 98 ?F (36.7 ?C)  ?TempSr

## 2021-06-20 ENCOUNTER — Ambulatory Visit (INDEPENDENT_AMBULATORY_CARE_PROVIDER_SITE_OTHER): Payer: Medicare Other | Admitting: Student

## 2021-06-20 ENCOUNTER — Encounter: Payer: Self-pay | Admitting: Student

## 2021-06-20 VITALS — BP 98/54 | HR 89 | Temp 98.0°F | Ht 72.0 in | Wt 161.0 lb

## 2021-06-20 DIAGNOSIS — K117 Disturbances of salivary secretion: Secondary | ICD-10-CM | POA: Diagnosis not present

## 2021-06-20 DIAGNOSIS — R590 Localized enlarged lymph nodes: Secondary | ICD-10-CM | POA: Diagnosis not present

## 2021-06-20 MED ORDER — FLUTICASONE PROPIONATE 50 MCG/ACT NA SUSP: 1.0000 | Freq: Every day | NASAL | 3 refills | Status: AC

## 2021-06-20 NOTE — Patient Instructions (Addendum)
-   sugar free gum during the day as needed to stimulate saliva production ?- take shower or do neti pot to clear nose of crusting, then flonase 1 spray each nare for at least 2-3 weeks daily to see if this helps with post-nasal drainage ?- acupuncture referral we will call to help schedule for xerostomia ?- see you in 7 months ?

## 2021-06-25 ENCOUNTER — Telehealth: Payer: Self-pay | Admitting: Nutrition

## 2021-06-25 ENCOUNTER — Ambulatory Visit: Payer: PRIVATE HEALTH INSURANCE

## 2021-06-25 NOTE — Telephone Encounter (Signed)
Telephone follow up attempted with patient. Left message for return call.

## 2021-06-26 ENCOUNTER — Ambulatory Visit (INDEPENDENT_AMBULATORY_CARE_PROVIDER_SITE_OTHER): Payer: Medicare Other

## 2021-06-26 DIAGNOSIS — I459 Conduction disorder, unspecified: Secondary | ICD-10-CM

## 2021-06-27 LAB — CUP PACEART REMOTE DEVICE CHECK
Battery Remaining Longevity: 125 mo
Battery Voltage: 3.03 V
Brady Statistic AP VP Percent: 4.45 %
Brady Statistic AP VS Percent: 0.08 %
Brady Statistic AS VP Percent: 92.38 %
Brady Statistic AS VS Percent: 3.09 %
Brady Statistic RA Percent Paced: 4.52 %
Brady Statistic RV Percent Paced: 96.83 %
Date Time Interrogation Session: 20230522211432
Implantable Lead Implant Date: 20220523
Implantable Lead Implant Date: 20220523
Implantable Lead Location: 753859
Implantable Lead Location: 753860
Implantable Lead Model: 3830
Implantable Lead Model: 5076
Implantable Pulse Generator Implant Date: 20220523
Lead Channel Impedance Value: 266 Ohm
Lead Channel Impedance Value: 342 Ohm
Lead Channel Impedance Value: 456 Ohm
Lead Channel Impedance Value: 532 Ohm
Lead Channel Pacing Threshold Amplitude: 0.875 V
Lead Channel Pacing Threshold Amplitude: 0.875 V
Lead Channel Pacing Threshold Pulse Width: 0.4 ms
Lead Channel Pacing Threshold Pulse Width: 0.4 ms
Lead Channel Sensing Intrinsic Amplitude: 1.625 mV
Lead Channel Sensing Intrinsic Amplitude: 1.625 mV
Lead Channel Sensing Intrinsic Amplitude: 22.625 mV
Lead Channel Sensing Intrinsic Amplitude: 22.625 mV
Lead Channel Setting Pacing Amplitude: 1.75 V
Lead Channel Setting Pacing Amplitude: 2.5 V
Lead Channel Setting Pacing Pulse Width: 0.4 ms
Lead Channel Setting Sensing Sensitivity: 1.2 mV

## 2021-07-06 ENCOUNTER — Ambulatory Visit (INDEPENDENT_AMBULATORY_CARE_PROVIDER_SITE_OTHER): Payer: Medicare Other | Admitting: Dentistry

## 2021-07-06 ENCOUNTER — Telehealth: Payer: Self-pay | Admitting: *Deleted

## 2021-07-06 VITALS — BP 109/65 | HR 82 | Temp 97.8°F | Wt 158.0 lb

## 2021-07-06 DIAGNOSIS — R634 Abnormal weight loss: Secondary | ICD-10-CM

## 2021-07-06 DIAGNOSIS — R1310 Dysphagia, unspecified: Secondary | ICD-10-CM | POA: Diagnosis not present

## 2021-07-06 DIAGNOSIS — Z923 Personal history of irradiation: Secondary | ICD-10-CM | POA: Diagnosis not present

## 2021-07-06 DIAGNOSIS — K117 Disturbances of salivary secretion: Secondary | ICD-10-CM

## 2021-07-06 MED ORDER — SODIUM FLUORIDE 1.1 % DT CREA: TOPICAL_CREAM | DENTAL | 6 refills | Status: AC

## 2021-07-06 NOTE — Telephone Encounter (Signed)
CALLED PATIENT TO ASK ABOUT SEEING THE NUTRITIONIST, PATIENT AGREED TO COME ON 08-03-21 AND SEE THE DR. AND THE NUTRITIONIST

## 2021-07-06 NOTE — Progress Notes (Signed)
Department of Dental Medicine    Service Date:   07/06/2021  Patient Name:  Steven Mathews Date of Birth:   [birth year] Medical Record Number: [id number]   LIMITED EXAM PLAN/RECOMMENDATIONS   ASSESSMENT: Xerostomia, dysphagia, weight loss  RECOMMENDATIONS: Brush after meals & at bedtime. Use fluoride at bedtime. Use trismus exercises as directed. Use CLoSYS & warm salt water/baking soda rinses as needed. Take multiple sips of water as needed.  PLAN: Follow-up as needed.  Rx:  Fluoride gel to use with fluoride trays sent into pharmacy today Return to primary dentist  for routine dental care including replacement of missing teeth as needed, cleanings/periodontal therapy and exams. Call if any questions or concerns arise.       07/06/2021    FOLLOW-UP NOTE:  HISTORY OF PRESENT ILLNESS: Steven Mathews is a 76 y.o. 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   The patient has completed 35 of 35 radiation treatments from 01/22/21 to 03/13/21.  He did not have any chemotherapy treatments.   REVIEW OF CHIEF COMPLAINTS: DRY MOUTH:  Yes HARD TO SWALLOW:  Yes HURTS TO SWALLOW:   No TASTE CHANGES:  Yes, but taste continues to slowly return SORES IN MOUTH:  No LIMITED OPENING:  No WEIGHT LOSS:  Yes  158 lbs down from 195 lbs   AT- HOME ORAL HYGIENE REGIMEN: BRUSHING: Yes, twice daily FLOSSING:  No; reports pain when flossing. 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   Encounter for fitting or adjustment of dental prosthetic device 01/10/2021   Hilar lymphadenopathy    Encounter for preoperative dental examination 01/02/2021    Tooth missing 01/02/2021   Gingival recession, generalized 01/02/2021   Defective dental restoration 01/02/2021   Dental attrition, excessive 01/02/2021   Diastema 01/02/2021   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   Pacemaker 12/19/2020   Neck mass 12/11/2020   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   Bradycardia 08/01/2020   Heart block 06/13/2020   Therapeutic drug monitoring 09/16/2018   Rash 05/25/2018   Essential hypertension 05/25/2018   Educated about COVID-19 virus infection 05/25/2018   Acute ST elevation myocardial infarction (STEMI) due to occlusion of right coronary artery ([place]) 03/13/2018   Hx of CABG    Hx of adenomatous colonic polyps 04/04/2016   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   Chronic cough    GERD (gastroesophageal reflux disease)    Hx of adenomatous colonic polyps 04/04/2016   Hyperlipidemia    Hypertension    Myocardial infarct [facility])    Presence of permanent cardiac pacemaker  SVT (supraventricular tachycardia) [facility])    Past Surgical History:  Procedure Laterality Date   CORONARY ARTERY BYPASS GRAFT  03/2006   DIRECT LARYNGOSCOPY N/A 12/11/2020   Procedure: DIRECT LARYNGOSCOPY WITH BIOPSY;  Surgeon: Izora Gala, MD;  Location: [place];  Service: ENT;  Laterality: N/A;   ENDOBRONCHIAL ULTRASOUND  N/A 01/08/2021   Procedure: ENDOBRONCHIAL ULTRASOUND;  Surgeon: [name], MD;  Location: [facility];  Service: Pulmonary;  Laterality: N/A;   ESOPHAGOSCOPY N/A 12/11/2020   Procedure: ESOPHAGOSCOPY;  Surgeon: Izora Gala, MD;  Location: [place];  Service: ENT;  Laterality: N/A;   FINE NEEDLE ASPIRATION  01/08/2021   Procedure: FINE NEEDLE ASPIRATION;  Surgeon: [name], MD;  Location: [facility];  Service: Pulmonary;;   HERNIA REPAIR  1964   IR GASTROSTOMY TUBE MOD SED  02/06/2021   LEFT HEART CATH AND CORS/GRAFTS ANGIOGRAPHY N/A 03/13/2018   Procedure: LEFT HEART CATH AND CORS/GRAFTS ANGIOGRAPHY;  Surgeon: Troy Sine, MD;  Location: [place] CV LAB;  Service: Cardiovascular;  Laterality: N/A;   PACEMAKER IMPLANT N/A 06/26/2020   Procedure: PACEMAKER IMPLANT;  Surgeon: Evans Lance, MD;  Location: [place] CV LAB;  Service: Cardiovascular;  Laterality: N/A;   RADICAL NECK DISSECTION Right 12/11/2020   Procedure: RIGHT MODIFIED NECK DISSECTION;  Surgeon: Izora Gala, MD;  Location: [place];  Service: ENT;  Laterality: Right;   SUPRAVENTRICULAR TACHYCARDIA ABLATION N/A 09/03/2012   Procedure: SUPRAVENTRICULAR TACHYCARDIA ABLATION;  Surgeon: Evans Lance, MD;  Location: [facility] CATH LAB;  Service: Cardiovascular;  Laterality: N/A;   TONSILLECTOMY Bilateral 12/11/2020   Procedure: BILATERAL TONSILLECTOMY;  Surgeon: Izora Gala, MD;  Location: [place];  Service: ENT;  Laterality: Bilateral;   VIDEO BRONCHOSCOPY  01/08/2021   Procedure: VIDEO BRONCHOSCOPY WITHOUT FLUORO;  Surgeon: [name], MD;  Location: [facility];  Service: Pulmonary;;   Current Outpatient Medications  Medication Sig Dispense Refill   aspirin 81 MG chewable tablet Chew 1 tablet (81 mg total) by mouth daily. 90 tablet 3   fluticasone (FLONASE) 50 MCG/ACT nasal spray Place 1 spray into both nostrils daily. 16 g 3   guaiFENesin (MUCINEX) 600 MG 12 hr tablet Take 600 mg by mouth 2 (two) times  daily as needed.     metoprolol succinate (TOPROL-XL) 50 MG 24 hr tablet Take 50 mg by mouth daily. Take with or immediately following a meal.     Nutritional Supplements (KATE FARMS STANDARD 1.4) LIQD 1 Bottle by Enteral route 5 (five) times daily. 1625 mL 6   rosuvastatin (CRESTOR) 40 MG tablet Take 40 mg by mouth daily.     No current facility-administered medications for this visit.   No Known Allergies   VITALS: BP 109/65 (BP Location: Right Arm, Patient Position: Sitting, Cuff Size: Normal)   Pulse 82   Temp 97.8 F (36.6 C) (Oral)   Wt 158 lb (71.7 kg)   BMI 21.43 kg/m    DENTAL EXAM: ORAL HYGIENE (PLAQUE):  Minimal MUCOSITIS:  No DESCRIPTION OF SALIVA:  Thick, viscous saliva EXPOSED BONE:  No OTHER WATCHED AREAS:  None   ASSESSMENT/DIAGNOSES: Patient with history of radiation therapy to the head and neck region.  XEROSTOMIA:   This is the patient's worst reported symptom.  Recommend trying CLoSYS (samples and coupons given to the patient today) to try to help alleviate symptoms.  DYSPHAGIA:  He reports issues with this due to postnasal drip (from what it sounds like).  Recommend trying an antihistamine such as  Zyrtec or Xyzal to see if this helps.  Otherwise, he has a follow-up appointment with Dr. Isidore Moos the end of June. WEIGHT LOSS:  He has lost a lot of weight and is still on a feeding tube.  He reports gaining 4 pounds recently and is continuing to improve eating food by mouth.  He is able to eat cereal now in the  mornings.   PROCEDURES: Delivery of upper and lower fluoride trays. Appliances were tried in and adjusted as needed.  Polished. Postoperative instructions were provided in a written and verbal format concerning the use and care of appliances.    PLAN AND RECOMMENDATIONS: Brush after meals and at bedtime.  Use fluoride at bedtime. Use trismus exercises as directed. Use CLoSYS as needed for dry mouth and salt water/baking soda rinses as needed. Take  multiple sips of water as needed.  Return to regular dentist for routine dental care including cleanings, periodic exams and replacement of missing teeth as needed. Follow-up as needed in the hospital clinic.   Call if any problems or concerns arise.  All questions and concerns were invited and addressed.  The patient tolerated today's visit well and departed in stable condition.   Charlaine Dalton, D.M.D.

## 2021-07-06 NOTE — Patient Instructions (Addendum)
[facility] Department of Dental Medicine Dr. Isolde Skaff B. Ellyn Rubiano, D.M.D. Phone: [phone] Fax: [phone]      It was a pleasure seeing you today!  Please refer to the information below regarding your dental visit with us.  Please do not hesitate to give us a call if any questions or concerns come up after you leave.    Thank you for letting us provide care for you.  If there is anything we can do for you, please let us know.    RECOMMENDATIONS: Brush after meals and at bedtime.  Floss once a day, and use fluoride at bedtime. Use trismus exercises as directed below. Use CLOSYs for dry mouth, and salt water/baking soda rinses to help with any mouth sores. Take multiple sips of water as needed.  Stay hydrated. Return to your regular dentist for routine dental care including cleanings and periodic exams.  If you do not have a regular dentist, it is important to establish care at an outside dental office of your choice.  Call us if any problems or concerns arise.   TRISMUS: Trismus is a condition where the jaw does not allow the mouth to open as wide as it usually does.  This can happen almost suddenly, or in other cases the process is so slow, it is hard to notice it-until it is too far along.  When the jaw joints and/or muscles have been exposed to radiation treatments, the onset of trismus is very slow.  This is because the muscles are losing their stretching ability over a long period of time, as long as 2 YEARS after the end of radiation.  It is therefore important to exercise these muscles and joints.  Trismus exercises: Use the stack of tongue depressors measuring the same or a little less than your maximum opening that was recorded at your first dental visit. Place the stack in your mouth, supporting the other end with your hand. Allow 30 seconds for muscle stretching. Rest for a few seconds. Repeat 3-5 times. For all radiation patients, this  exercise is recommended in the mornings and evenings unless otherwise instructed. The exercises should be done for a period of 2 YEARS after the end of radiation. Maximum jaw opening should be checked routinely on recall dental visits by your general dentist. Report any changes, soreness, or difficulties encountered when doing the exercises to your dentist.    FLUORIDE TRAY INSTRUCTIONS    HOW DO I USE MY FLUORIDE TRAYS? The best time to use your Fluoride is in the morning after breakfast, or right before bed time.  You must brush your teeth very well and floss before using the Fluoride in order to get the best use out of the Fluoride treatments. Place 1 drop of Fluoride in each tooth space in the tray.  You do not need to use more. Place the tray(s) over your teeth.  Make sure the trays are seated all the way.  Remember, they only fit one way on your teeth. Clench your teeth together a few times with light chewing motions to distribute the fluoride. Leave the trays in place for a full 5 minutes.  You may drool a bit, so keep a tissue handy. At the end of the 5 minutes, take the trays out.  SPIT OUT the remaining fluoride, but DO NOT RINSE. DO NOT eat or drink after treatments for at least 30 minutes.  This is why the best time for your treatments is before bedtime.     HOW DO I CARE FOR MY FLUORIDE TRAYS? Clean the inside of your Fluoride trays using cold water and a toothbrush. In order to keep your trays from discoloring and free from odors, soak them overnight in denture cleaners or soap and water.  Do not use bleach or non-denture products. Store the trays in a safe dry place AWAY from any heat until your next treatment.  Finally, be sure to let your pharmacy know when you are close to needing a new refill for your fluoride prescription so they have it ready for you without interruption of fluoride use.       If anything happens to your fluoride trays, or they don't fit as well after  any dental work, please let us know as soon as possible.  Call our office during office hours to schedule an appointment at [phone].  

## 2021-07-06 NOTE — Telephone Encounter (Signed)
CALLED PATIENT TO INFORM THAT FU APPT, WITH DR. Isidore Moos HAS BEEN CANCELLED ON 08-03-21 PER HEAD AND NECK NAVIGATOR REQUEST, NO ANSWER WILL CALL LATER

## 2021-07-11 NOTE — Progress Notes (Signed)
Remote pacemaker transmission.   

## 2021-07-21 ENCOUNTER — Other Ambulatory Visit: Payer: Self-pay | Admitting: Cardiology

## 2021-07-31 ENCOUNTER — Encounter: Payer: Self-pay | Admitting: Internal Medicine

## 2021-08-03 ENCOUNTER — Inpatient Hospital Stay: Payer: PRIVATE HEALTH INSURANCE | Attending: Radiation Oncology | Admitting: Dietician

## 2021-08-03 ENCOUNTER — Ambulatory Visit: Payer: Self-pay | Admitting: Radiation Oncology

## 2021-08-03 NOTE — Progress Notes (Signed)
Patient did not show for nutrition appointment. 

## 2021-09-13 ENCOUNTER — Other Ambulatory Visit: Payer: Self-pay | Admitting: Cardiology

## 2021-09-25 ENCOUNTER — Ambulatory Visit (INDEPENDENT_AMBULATORY_CARE_PROVIDER_SITE_OTHER): Payer: Medicare Other

## 2021-09-25 DIAGNOSIS — I459 Conduction disorder, unspecified: Secondary | ICD-10-CM | POA: Diagnosis not present

## 2021-09-27 LAB — CUP PACEART REMOTE DEVICE CHECK
Battery Remaining Longevity: 123 mo
Battery Voltage: 3.01 V
Brady Statistic AP VP Percent: 7.4 %
Brady Statistic AP VS Percent: 0.04 %
Brady Statistic AS VP Percent: 90.91 %
Brady Statistic AS VS Percent: 1.65 %
Brady Statistic RA Percent Paced: 7.39 %
Brady Statistic RV Percent Paced: 98.31 %
Date Time Interrogation Session: 20230823164307
Implantable Lead Implant Date: 20220523
Implantable Lead Implant Date: 20220523
Implantable Lead Location: 753859
Implantable Lead Location: 753860
Implantable Lead Model: 3830
Implantable Lead Model: 5076
Implantable Pulse Generator Implant Date: 20220523
Lead Channel Impedance Value: 266 Ohm
Lead Channel Impedance Value: 361 Ohm
Lead Channel Impedance Value: 475 Ohm
Lead Channel Impedance Value: 532 Ohm
Lead Channel Pacing Threshold Amplitude: 0.875 V
Lead Channel Pacing Threshold Amplitude: 0.875 V
Lead Channel Pacing Threshold Pulse Width: 0.4 ms
Lead Channel Pacing Threshold Pulse Width: 0.4 ms
Lead Channel Sensing Intrinsic Amplitude: 1.75 mV
Lead Channel Sensing Intrinsic Amplitude: 1.75 mV
Lead Channel Sensing Intrinsic Amplitude: 21.875 mV
Lead Channel Sensing Intrinsic Amplitude: 21.875 mV
Lead Channel Setting Pacing Amplitude: 1.75 V
Lead Channel Setting Pacing Amplitude: 2.5 V
Lead Channel Setting Pacing Pulse Width: 0.4 ms
Lead Channel Setting Sensing Sensitivity: 1.2 mV

## 2021-10-23 NOTE — Progress Notes (Signed)
Remote pacemaker transmission.   

## 2021-11-12 ENCOUNTER — Telehealth: Payer: Self-pay | Admitting: Cardiology

## 2021-11-12 NOTE — Telephone Encounter (Signed)
Spoke to patient he stated he was prescribed Cevimeline 30 mg three times a day and Piocarpine 5 mg three times a day.He wanted to make sure ok to take with his other medications.Message sent to our Pharm D for advice.

## 2021-11-12 NOTE — Telephone Encounter (Signed)
Pt c/o medication issue:  1. Name of Medication: cevimeline '30mg'$     Pilocarpine '5mg'$   2. How are you currently taking this medication (dosage and times per day)? 3 times a day, both of them  3. Are you having a reaction (difficulty breathing--STAT)? no  4. What is your medication issue? These medications are being prescribed by his Ear, nose and throat doctor.  He wants to know if it's okay to take these medication since he read that they had some type of heart side effects. Please advise.

## 2021-11-14 NOTE — Telephone Encounter (Signed)
Spoke to patient Pharm D's advice given. 

## 2021-11-14 NOTE — Telephone Encounter (Signed)
Patient should keep close watch on blood pressure and heart rate since cevimeline can increase the effects of beta blockers

## 2021-11-19 ENCOUNTER — Telehealth: Payer: Self-pay | Admitting: *Deleted

## 2021-11-19 NOTE — Telephone Encounter (Signed)
CALLED PATIENT TO INFORM THAT LAB AND FU HAVE BEEN MOVED TO 12-18-21 DUE TO DR. SQUIRE BEING OFF ON 12-14-21, SPOKE WITH PATIENT AND HE IS AWARE OF THE APPT. CHANGE AND IS GOOD WITH IT

## 2021-12-10 ENCOUNTER — Ambulatory Visit
Admission: RE | Admit: 2021-12-10 | Discharge: 2021-12-10 | Disposition: A | Discharge status: Home / Self Care | Payer: Medicare Other | Source: Ambulatory Visit | Attending: Student | Admitting: Student

## 2021-12-10 DIAGNOSIS — R590 Localized enlarged lymph nodes: Secondary | ICD-10-CM

## 2021-12-13 NOTE — Progress Notes (Signed)
Steven Mathews presents today for follow-up after completing radiation to his base of tongue on 03/13/2021.   Pain issues, if any: throat dryness/ sore , makes it hard to swallow Using a feeding tube?: uses [employer] five times a day Weight changes, if any: yes, trying to gain weight Wt Readings from Last 3 Encounters:  12/18/21 160 lb 6.4 oz (72.8 kg)  07/06/21 158 lb (71.7 kg)  06/20/21 161 lb (73 kg)   Swallowing issues, if any: yes, still difficult to swallow at times. Smoking or chewing tobacco? no Using fluoride trays daily? no Last ENT visit was on: Last week saw Dr. Constance Holster, last Thursday, have appointment with Dr. Bjorn Loser tomorrow (they did Ct of Chest) Other notable issues, if any: asking about feeding tube issues, how to relieve scar tissue in throat/ burning  Vitals:   12/18/21 1522  BP: 118/77  Pulse: 83  Resp: 18  Temp: 97.8 F (36.6 C)  SpO2: 100%

## 2021-12-14 ENCOUNTER — Ambulatory Visit: Payer: Self-pay | Admitting: Radiation Oncology

## 2021-12-14 ENCOUNTER — Ambulatory Visit: Payer: Medicare Other

## 2021-12-17 ENCOUNTER — Other Ambulatory Visit: Payer: Self-pay

## 2021-12-17 ENCOUNTER — Telehealth: Payer: Self-pay | Admitting: *Deleted

## 2021-12-17 NOTE — Telephone Encounter (Signed)
Called patient to remind of lab and fu for 12-18-21, spoke with patient and he is aware of these appts.

## 2021-12-18 ENCOUNTER — Ambulatory Visit: Payer: Medicare Other | Admitting: Dietician

## 2021-12-18 ENCOUNTER — Encounter: Payer: Self-pay | Admitting: Radiation Oncology

## 2021-12-18 ENCOUNTER — Ambulatory Visit
Admission: RE | Admit: 2021-12-18 | Discharge: 2021-12-18 | Disposition: A | Discharge status: Home / Self Care | Payer: Medicare Other | Source: Ambulatory Visit | Attending: Radiation Oncology | Admitting: Radiation Oncology

## 2021-12-18 VITALS — BP 118/77 | HR 83 | Temp 97.8°F | Resp 18 | Ht 72.0 in | Wt 160.4 lb

## 2021-12-18 DIAGNOSIS — C01 Malignant neoplasm of base of tongue: Secondary | ICD-10-CM | POA: Insufficient documentation

## 2021-12-18 DIAGNOSIS — C770 Secondary and unspecified malignant neoplasm of lymph nodes of head, face and neck: Secondary | ICD-10-CM

## 2021-12-18 DIAGNOSIS — Z923 Personal history of irradiation: Secondary | ICD-10-CM | POA: Insufficient documentation

## 2021-12-18 DIAGNOSIS — I2510 Atherosclerotic heart disease of native coronary artery without angina pectoris: Secondary | ICD-10-CM | POA: Insufficient documentation

## 2021-12-18 DIAGNOSIS — R638 Other symptoms and signs concerning food and fluid intake: Secondary | ICD-10-CM

## 2021-12-18 DIAGNOSIS — Z79899 Other long term (current) drug therapy: Secondary | ICD-10-CM | POA: Insufficient documentation

## 2021-12-18 DIAGNOSIS — Z7982 Long term (current) use of aspirin: Secondary | ICD-10-CM | POA: Diagnosis not present

## 2021-12-18 DIAGNOSIS — Z1329 Encounter for screening for other suspected endocrine disorder: Secondary | ICD-10-CM

## 2021-12-18 LAB — CMP (CANCER CENTER ONLY)
ALT: 10 U/L (ref 0–44)
AST: 15 U/L (ref 15–41)
Albumin: 4.5 g/dL (ref 3.5–5.0)
Alkaline Phosphatase: 46 U/L (ref 38–126)
Anion gap: 6 (ref 5–15)
BUN: 21 mg/dL (ref 8–23)
CO2: 28 mmol/L (ref 22–32)
Calcium: 9.8 mg/dL (ref 8.9–10.3)
Chloride: 100 mmol/L (ref 98–111)
Creatinine: 0.95 mg/dL (ref 0.61–1.24)
GFR, Estimated: >60 mL/min (ref >60)
Glucose, Bld: 100 mg/dL — ABNORMAL HIGH (ref 70–99)
Potassium: 4.3 mmol/L (ref 3.5–5.1)
Sodium: 134 mmol/L — ABNORMAL LOW (ref 135–145)
Total Bilirubin: 0.3 mg/dL (ref 0.3–1.2)
Total Protein: 7.3 g/dL (ref 6.5–8.1)

## 2021-12-18 LAB — MAGNESIUM: Magnesium: 2.1 mg/dL (ref 1.7–2.4)

## 2021-12-18 NOTE — Progress Notes (Signed)
Radiation Oncology         [phone] ________________________________  Name: Steven Mathews MRN: [id number]  Date: 12/18/2021  DOB: 02/03/1946  Follow-Up Visit Note  Outpatient  CC: Cathlean Sauer, MD  Izora Gala, MD  Diagnosis and Prior Radiotherapy:    ICD-10-CM   1. Malignant neoplasm of base of tongue ([place])  C01      Cancer Staging  Malignant neoplasm of base of tongue ([place]) Staging form: Pharynx - HPV-Mediated Oropharynx, AJCC 8th Edition - Clinical stage from 12/27/2020: Stage II (cT1, cN2, cM0, p16+) - Signed by Eppie Gibson, MD on 12/27/2020 Stage prefix: Initial diagnosis   CHIEF COMPLAINT: Here for follow-up and surveillance of throat cancer  Narrative:  [name] presents today for follow-up after completing radiation to his base of tongue on 03/13/2021 and to review CT scan results from 12/12/2021.   Referred back to pulmonary medicine to weigh in on PET images of hypermetabolic thoracic nodes on 5/17. Dr. Verlee Monte did not believe lymph nodes showed any interval growth. He ordered a chest CT in 6 months.   CT of the chest on 11/06 showed No enlarged mediastinal or hilar lymph nodes by size criteria. Sites of increased radiotracer activity on previous PET-CT corresponds to small lymph nodes including a 7 mm right hilar node.   Pain issues, if any: Patient denies throat or mouth pain, or ear or jaw pain. Using a feeding tube?: Yes--continues to supplement with [employer] 5x/day Weight changes, if any: yes, trying to gain weight Wt Readings from Last 3 Encounters:  12/19/21 162 lb 12.8 oz (73.8 kg)  12/18/21 160 lb 6.4 oz (72.8 kg)  07/06/21 158 lb (71.7 kg)   Swallowing issues, if any: Having difficulty swallowing solid foods. When he swallows food he feels as though it gets stuck in his throat and he needs water to get it down. This process makes it difficult for him to want to eat.  Smoking or chewing tobacco? None Using fluoride trays daily? Using them on  occasion. Last ENT visit was on: Saw Dr. Constance Holster last Thursday. Scheduled to see Dr. Verlee Monte tomorrow.  Other notable issues, if any: Continues to deal with dry mouth and thick phlegm. He was prescribed pilocarpine to help with secretion. He states this really only helps with nasal drainage and has unfortunately not helped with saliva secretion.     ALLERGIES:  has No Known Allergies.  Meds: Current Outpatient Medications  Medication Sig Dispense Refill   aspirin 81 MG chewable tablet Chew 1 tablet (81 mg total) by mouth daily. 90 tablet 3   Nutritional Supplements (KATE FARMS STANDARD 1.4) LIQD 1 Bottle by Enteral route 5 (five) times daily. 1625 mL 6   pilocarpine (SALAGEN) 5 MG tablet Take 5 mg by mouth 3 (three) times daily.     rosuvastatin (CRESTOR) 40 MG tablet TAKE 1 TABLET(40 MG) BY MOUTH DAILY 90 tablet 2   sodium fluoride (PREVIDENT 5000 PLUS) 1.1 % CREA dental cream Place 1 pea-size drop into each tooth space of fluoride trays once a day at bedtime.  Leave trays in for 5 minutes and then remove.  Spit out excess fluoride, but DO NOT rinse with water, eat or drink for at least 30 minutes after use. 51 g 6   fluticasone (FLONASE) 50 MCG/ACT nasal spray Place 1 spray into both nostrils daily. 16 g 3   guaiFENesin (MUCINEX) 600 MG 12 hr tablet Take 600 mg by mouth 2 (two) times daily as needed.  metoprolol succinate (TOPROL-XL) 50 MG 24 hr tablet Take 50 mg by mouth daily. Take with or immediately following a meal. (Patient not taking: Reported on 12/18/2021)     No current facility-administered medications for this encounter.    Physical Findings: The patient is in no acute distress. Patient is alert and oriented.  height is 6' (1.829 m) and weight is 160 lb 6.4 oz (72.8 kg). His temperature is 97.8 F (36.6 C). His blood pressure is 118/77 and his pulse is 83. His respiration is 18 and oxygen saturation is 100%. .    General: Alert and oriented, in no acute distress HEENT: Head  is normocephalic. Extraocular movements are intact. Oropharynx - no thrush or tumor.   Neck: Neck is supple, no palpable cervical or supraclavicular lymphadenopathy. Extremities: No cyanosis or edema. Lymphatics: see Neck Exam Skin: intact over neck, healed well Heart RRR Chest CTAB Psychiatric: Judgment and insight are intact. Affect is appropriate.   Lab Findings: Lab Results  Component Value Date   WBC 5.7 02/06/2021   HGB 13.5 02/06/2021   HCT 39.4 02/06/2021   MCV 92.7 02/06/2021   PLT 179 02/06/2021    Radiographic Findings: CT Chest Wo Contrast  Result Date: 12/12/2021 CLINICAL DATA:  History of cancer.  Assess lymphadenopathy EXAM: CT CHEST WITHOUT CONTRAST TECHNIQUE: Multidetector CT imaging of the chest was performed following the standard protocol without IV contrast. RADIATION DOSE REDUCTION: This exam was performed according to the departmental dose-optimization program which includes automated exposure control, adjustment of the mA and/or kV according to patient size and/or use of iterative reconstruction technique. COMPARISON:  PET-CT 06/11/2021 FINDINGS: Cardiovascular: Normal heart size. No pericardial effusion. Postoperative CABG. Thoracic aorta is nonaneurysmal. Scattered atherosclerotic vascular calcifications of the aorta and coronary arteries. Central pulmonary vasculature is within normal limits. Left-sided implanted cardiac device. Mediastinum/Nodes: No enlarged axillary, mediastinal, or hilar lymph nodes by size criteria. Sites of increased radiotracer activity on previous PET-CT corresponds to small lymph nodes including 7 mm right hilar node (series 2, image 72). There are small calcified left hilar lymph nodes. Evaluation of the hilar regions is somewhat limited in the absence of intravenous contrast. Thyroid, trachea, and esophagus demonstrate no significant findings. Lungs/Pleura: No pulmonary nodules or masses. No airspace consolidation. No pleural effusion or  pneumothorax. Upper Abdomen: No acute abnormality. Percutaneous gastrostomy tube positioned within the distal gastric body. Scattered punctate calcified granulomas in the liver and spleen. Musculoskeletal: No chest wall mass or suspicious bone lesions identified. IMPRESSION: 1. No enlarged mediastinal or hilar lymph nodes by size criteria. Sites of increased radiotracer activity on previous PET-CT corresponds to small lymph nodes including a 7 mm right hilar node. 2. No pulmonary nodules or masses. 3. Aortic and coronary artery atherosclerosis (ICD10-I70.0). Electronically Signed   By: Davina Poke D.O.   On: 12/12/2021 15:35    Impression/Plan:    1) Head and Neck Cancer Status: I personally reviewed his Chest CT and it shows no evidence of recurrent disease.  Physical exam reassuring as well for head and neck  2) Nutritional Status: working on gaining weight. Met with nutrition today and discussed supplement options.  PEG tube: using - we discussed how to wean off this w/ PO intake  3)  Swallowing: Patient struggling to swallow solid foods. He feels as though food gets stuck in his throat and he needs water to help get it down. Will refer to back to [facility] in [place] to do a swallowing study.  5) Dental: Encouraged to continue regular followup with dentistry, and dental hygiene including fluoride rinses.   6) Thyroid function:  check q 6-69mo- TSH slightly elevated, but Free T4 normal Lab Results  Component Value Date   TSH 5.798 (H) 12/18/2021   7) Follow-up in 6 mo with me.The patient was encouraged to call with any issues or questions before then.   On date of service, in total, I spent 30 minutes on this encounter. Patient was seen in person.  This note was not signed on date of service, but he was seen on 12/18/21. Minutes pertain to date of encounter. _____________________________________   ELeona Singleton PA   SEppie Gibson MD

## 2021-12-18 NOTE — Progress Notes (Unsigned)
Synopsis: Referred for right hilar adenopathy by Cathlean Sauer, MD  Subjective:   PATIENT ID: Steven Mathews GENDER: male DOB: Jul 03, 1945, MRN: [id number]  No chief complaint on file.  76yM with history of recently discovered head/neck SCC, CAD, AF, s/p PPM, SVT.  SCC discovered during workup of OP dysphagia, underwent right neck dissection and biopsy of left/right tongue base, excisional biopsy of tonsils. Preceding PET/CT showed hypermetabolic right hilar lymph node for which he was referred to [place].   He says he's overall doing well. Still has sore throat after surgery. No hemoptysis. Still has some trouble swallowing which he attributes to sore throat.   Interval HPI: Stable surveillance CT   Otherwise pertinent review of systems is negative.  Past Medical History:  Diagnosis Date   Atrial fibrillation ([place])    CAD (coronary artery disease)    CABG 2008; 03/13/2018 NSTEMI due to acute thrombotic occlusion of RCA graft, patent LIMA to LAD.   Chronic cough    GERD (gastroesophageal reflux disease)    Hx of adenomatous colonic polyps 04/04/2016   Hyperlipidemia    Hypertension    Myocardial infarct ([facility])    Presence of permanent cardiac pacemaker    SVT (supraventricular tachycardia)      Family History  Problem Relation Age of Onset   Liver cancer Father    Cancer Mother        bladder cancer   Colon cancer Brother 36     Past Surgical History:  Procedure Laterality Date   CORONARY ARTERY BYPASS GRAFT  03/2006   DIRECT LARYNGOSCOPY N/A 12/11/2020   Procedure: DIRECT LARYNGOSCOPY WITH BIOPSY;  Surgeon: Izora Gala, MD;  Location: [place];  Service: ENT;  Laterality: N/A;   ENDOBRONCHIAL ULTRASOUND N/A 01/08/2021   Procedure: ENDOBRONCHIAL ULTRASOUND;  Surgeon: [name], MD;  Location: [facility];  Service: Pulmonary;  Laterality: N/A;   ESOPHAGOSCOPY N/A 12/11/2020   Procedure: ESOPHAGOSCOPY;  Surgeon: Izora Gala, MD;  Location: [place];  Service: ENT;   Laterality: N/A;   FINE NEEDLE ASPIRATION  01/08/2021   Procedure: FINE NEEDLE ASPIRATION;  Surgeon: [name], MD;  Location: [facility];  Service: Pulmonary;;   HERNIA REPAIR  1964   IR GASTROSTOMY TUBE MOD SED  02/06/2021   LEFT HEART CATH AND CORS/GRAFTS ANGIOGRAPHY N/A 03/13/2018   Procedure: LEFT HEART CATH AND CORS/GRAFTS ANGIOGRAPHY;  Surgeon: Troy Sine, MD;  Location: [place] [facility];  Service: Cardiovascular;  Laterality: N/A;   PACEMAKER IMPLANT N/A 06/26/2020   Procedure: PACEMAKER IMPLANT;  Surgeon: Evans Lance, MD;  Location: [facility] [facility];  Service: Cardiovascular;  Laterality: N/A;   RADICAL NECK DISSECTION Right 12/11/2020   Procedure: RIGHT MODIFIED NECK DISSECTION;  Surgeon: Izora Gala, MD;  Location: [place];  Service: ENT;  Laterality: Right;   SUPRAVENTRICULAR TACHYCARDIA ABLATION N/A 09/03/2012   Procedure: SUPRAVENTRICULAR TACHYCARDIA ABLATION;  Surgeon: Evans Lance, MD;  Location: [facility];  Service: Cardiovascular;  Laterality: N/A;   TONSILLECTOMY Bilateral 12/11/2020   Procedure: BILATERAL TONSILLECTOMY;  Surgeon: Izora Gala, MD;  Location: [place];  Service: ENT;  Laterality: Bilateral;   VIDEO BRONCHOSCOPY  01/08/2021   Procedure: VIDEO BRONCHOSCOPY WITHOUT FLUORO;  Surgeon: [name], MD;  Location: [facility];  Service: Pulmonary;;    Social History   Socioeconomic History   Marital status: Married    Spouse name: Not on file   Number of children: Not on file   Years of education: Not  on file   Highest education level: Not on file  Occupational History   Not on file  Tobacco Use   Smoking status: Never   Smokeless tobacco: Never  Vaping Use   Vaping Use: Never used  Substance and Sexual Activity   Alcohol use: Yes    Comment: 2-3 glasses wine/day   Drug use: No   Sexual activity: Not on file  Other Topics Concern   Not on file  Social History Narrative   Right handed   Two story home   Drinks caffeine    Social Determinants of Health   Financial Resource Strain: Low Risk  (01/18/2021)   Overall Financial Resource Strain (CARDIA)    Difficulty of Paying Living Expenses: Not hard at all  Food Insecurity: No Food Insecurity (01/11/2021)   Hunger Vital Sign    Worried About Running Out of Food in the Last Year: Never true    Ran Out of Food in the Last Year: Never true  Transportation Needs: No Transportation Needs (01/11/2021)   PRAPARE - Hydrologist (Medical): No    Lack of Transportation (Non-Medical): No  Physical Activity: Not on file  Stress: No Stress Concern Present (01/11/2021)   [place]    Feeling of Stress : Not at all  Social Connections: Unknown (01/11/2021)   Social Connection and Isolation Panel [NHANES]    Frequency of Communication with Friends and Family: More than three times a week    Frequency of Social Gatherings with Friends and Family: More than three times a week    Attends Religious Services: More than 4 times per year    Active Member of [employer] or Organizations: Not on file    Attends Archivist Meetings: Not on file    Marital Status: Married  Human resources officer Violence: Not on file     No Known Allergies   Outpatient Medications Prior to Visit  Medication Sig Dispense Refill   aspirin 81 MG chewable tablet Chew 1 tablet (81 mg total) by mouth daily. 90 tablet 3   fluticasone (FLONASE) 50 MCG/ACT nasal spray Place 1 spray into both nostrils daily. 16 g 3   guaiFENesin (MUCINEX) 600 MG 12 hr tablet Take 600 mg by mouth 2 (two) times daily as needed.     metoprolol succinate (TOPROL-XL) 50 MG 24 hr tablet Take 50 mg by mouth daily. Take with or immediately following a meal. (Patient not taking: Reported on 12/18/2021)     Nutritional Supplements (KATE FARMS STANDARD 1.4) LIQD 1 Bottle by Enteral route 5 (five) times daily. 1625 mL 6   pilocarpine  (SALAGEN) 5 MG tablet Take 5 mg by mouth 3 (three) times daily.     rosuvastatin (CRESTOR) 40 MG tablet TAKE 1 TABLET(40 MG) BY MOUTH DAILY 90 tablet 2   sodium fluoride (PREVIDENT 5000 PLUS) 1.1 % CREA dental cream Place 1 pea-size drop into each tooth space of fluoride trays once a day at bedtime.  Leave trays in for 5 minutes and then remove.  Spit out excess fluoride, but DO NOT rinse with water, eat or drink for at least 30 minutes after use. 51 g 6   No facility-administered medications prior to visit.       Objective:   Physical Exam:  General appearance: 76 y.o., male, NAD, conversant  Eyes: anicteric sclerae, moist conjunctivae; no lid-lag; PERRL, tracking appropriately HENT: NCAT; oropharynx, MMM, no mucosal ulcerations; normal  hard and soft palate Neck: Trachea midline; no lymphadenopathy, no JVD. Incision over R neck apparetnly healing well Lungs: CTAB, no crackles, no wheeze, with normal respiratory effort CV: RRR, no MRGs  Abdomen: Soft, non-tender; non-distended, BS present  Extremities: No peripheral edema, radial and DP pulses present bilaterally  Skin: Normal temperature, turgor and texture; no rash Psych: Appropriate affect Neuro: Alert and oriented to person and place, no focal deficit    There were no vitals filed for this visit.     on RA BMI Readings from Last 3 Encounters:  12/18/21 21.75 kg/m  07/06/21 21.43 kg/m  06/20/21 21.84 kg/m   Wt Readings from Last 3 Encounters:  12/18/21 160 lb 6.4 oz (72.8 kg)  07/06/21 158 lb (71.7 kg)  06/20/21 161 lb (73 kg)     CBC    Component Value Date/Time   WBC 5.7 02/06/2021 0832   RBC 4.25 02/06/2021 0832   HGB 13.5 02/06/2021 0832   HGB 15.1 06/13/2020 0913   HCT 39.4 02/06/2021 0832   HCT 43.0 06/13/2020 0913   PLT 179 02/06/2021 0832   PLT 264 06/13/2020 0913   MCV 92.7 02/06/2021 0832   MCV 94 06/13/2020 0913   MCH 31.8 02/06/2021 0832   MCHC 34.3 02/06/2021 0832   RDW 12.1 02/06/2021 0832    RDW 12.6 06/13/2020 0913   LYMPHSABS 2.3 06/13/2020 0913   MONOABS 0.4 03/13/2018 2055   EOSABS 0.3 06/13/2020 0913   BASOSABS 0.1 06/13/2020 0913     Chest Imaging: CT Chest 12/12/21 reviewed by me - no enlarged LN  PET/CT 06/11/21 reviewed by me with multiple pet avid hilar/mediastinal LN some of which are calcified and none appear to have shown interval growth. Also note splenic/liver granulomas.  PET/CT 11/29/20 reviewed by me remarkable for hypermetabolic right hilar node (looks like 10R has greatest activity on PET but on subsequent CT Chest 10R looks small, but does have mildly enlarged 11R node). Also hypermetabolic activity right neck, left tongue base    CT Chest 12/12/20 reviewed by me remarkable for 1.4cm R hilar node      Pulmonary Functions Testing Results:     No data to display            Echocardiogram:   Echo 2014 EF 55-60%     Assessment & Plan:   # hypermetabolic hilar/mediastinal lymph nodes # head and neck cancer No lymph nodes appear to have shown any interval growth and feel yield of EBUS would be low as size of largest node at 10R has not changed and was technically challenging, and there's no other mediastinal/hilar lymphadenopathy by size criteria that I can easily see. Calcification, and splenic/liver calcifications raise possibility it could represent indolent/subclinical or resolved/resolving granulomatous process. There is little by history to suggest active granulomatous disease whether sarcoid, endemic fungal infection, NTM, etc.   Plan: - Agree with CT Chest in 6 months which I've ordered - sugar free gum as needed - referral for acupuncture for xerostomia - nasal irrigation followed by flonase 1 spray each nare for postnasal drainage    RTC after surveillance CT Chest   [name], MD [facility] 12/18/2021 4:31 PM

## 2021-12-19 ENCOUNTER — Other Ambulatory Visit: Payer: Self-pay

## 2021-12-19 ENCOUNTER — Encounter: Payer: Self-pay | Admitting: Student

## 2021-12-19 ENCOUNTER — Ambulatory Visit (INDEPENDENT_AMBULATORY_CARE_PROVIDER_SITE_OTHER): Payer: Medicare Other | Admitting: Student

## 2021-12-19 VITALS — BP 124/70 | HR 78 | Temp 98.0°F | Ht 72.0 in | Wt 162.8 lb

## 2021-12-19 DIAGNOSIS — R590 Localized enlarged lymph nodes: Secondary | ICD-10-CM

## 2021-12-19 DIAGNOSIS — C760 Malignant neoplasm of head, face and neck: Secondary | ICD-10-CM | POA: Diagnosis not present

## 2021-12-19 DIAGNOSIS — R1310 Dysphagia, unspecified: Secondary | ICD-10-CM

## 2021-12-19 DIAGNOSIS — C01 Malignant neoplasm of base of tongue: Secondary | ICD-10-CM

## 2021-12-19 DIAGNOSIS — Z1329 Encounter for screening for other suspected endocrine disorder: Secondary | ICD-10-CM

## 2021-12-19 LAB — TSH: TSH: 5.798 u[IU]/mL — ABNORMAL HIGH (ref 0.350–4.500)

## 2021-12-19 LAB — T4, FREE: Free T4: 0.87 ng/dL (ref 0.61–1.12)

## 2021-12-19 LAB — PHOSPHORUS: Phosphorus: 4.1 mg/dL (ref 2.5–4.6)

## 2021-12-19 NOTE — Patient Instructions (Signed)
-   acupuncture referral placed previously for xerostomia - if anything we can do to help diagnostically down the road feel free to make appointment by calling or sending my chart message!

## 2021-12-19 NOTE — Progress Notes (Signed)
Oncology Nurse Navigator Documentation   I met with [name] before his follow up appointment with Dr. Isidore Moos today. He reports dysphagia with solids and liquids. Dr. Isidore Moos has placed orders for a swallowing study and SLP evaluation. He also reports that his PEG has debris inside it that makes it more difficult for him to get his nutritional supplement through and he has been scheduled for IR evaluation tomorrow at 8 am. He knows to call me if he has any further questions or concerns.   Harlow Asa RN, BSN, OCN Head & Neck Oncology Nurse [place] at [facility] Phone # [phone]  Fax # [phone]

## 2021-12-20 ENCOUNTER — Encounter: Payer: Self-pay | Admitting: Radiation Oncology

## 2021-12-20 ENCOUNTER — Other Ambulatory Visit: Payer: Self-pay | Admitting: Radiation Oncology

## 2021-12-20 ENCOUNTER — Ambulatory Visit (HOSPITAL_COMMUNITY)
Admission: RE | Admit: 2021-12-20 | Discharge: 2021-12-20 | Disposition: A | Discharge status: Home / Self Care | Payer: Medicare Other | Source: Ambulatory Visit | Attending: Radiation Oncology | Admitting: Radiation Oncology

## 2021-12-20 ENCOUNTER — Telehealth: Payer: Self-pay | Admitting: *Deleted

## 2021-12-20 DIAGNOSIS — R1310 Dysphagia, unspecified: Secondary | ICD-10-CM | POA: Diagnosis not present

## 2021-12-20 DIAGNOSIS — C01 Malignant neoplasm of base of tongue: Secondary | ICD-10-CM

## 2021-12-20 DIAGNOSIS — Z431 Encounter for attention to gastrostomy: Secondary | ICD-10-CM | POA: Diagnosis present

## 2021-12-20 HISTORY — PX: IR CM INJ ANY COLONIC TUBE W/FLUORO: IMG2336

## 2021-12-20 MED ORDER — IOHEXOL 300 MG/ML  SOLN
50.0000 mL | Freq: Once | INTRAMUSCULAR | Status: AC | PRN
  Administered 2021-12-20: 15 mL

## 2021-12-20 MED ORDER — LIDOCAINE VISCOUS HCL 2 % MT SOLN
OROMUCOSAL | Status: AC
  Filled 2021-12-20: qty 15

## 2021-12-20 NOTE — Telephone Encounter (Signed)
CALLED PATIENT TO INFORM OF FU APPT. WITH DR. Isidore Moos ON 06-21-22 @ 11 AM, LVM FOR A RETURN CALL

## 2021-12-20 NOTE — Procedures (Signed)
Pre procedure Dx: Dysphagia Post Procedure Dx: Same  Appropriately positioned and functioning gastrostomy tube.  No exchange performed.   The excessive length of the external portion of the gastrostomy tube was trimmed resulting in improved functionality of the gastrostomy tube.   Ronny Bacon, MD Pager #: [phone]

## 2021-12-21 ENCOUNTER — Encounter: Payer: Medicare Other | Admitting: Dietician

## 2021-12-23 NOTE — Progress Notes (Signed)
Nutrition Follow-up:  Patient has completed radiation therapy for tongue cancer.  Met with patient in radiation clinic. Patient reports tolerating small amounts of liquids. He has increased acid reflux felt in the base of throat with oral intake. Patient has dry mouth. This makes solid foods challenging to swallow. He endorses dysphagia with  small bites of steak and pork. Patient is relying on tube feedings. He is giving 4-5 cartons of [employer] 1.4 via PEG. Patient reports build up inside of tube which significantly slows flow of bolus feeds. Patient states he is maintaining weight. He has started playing golf again which he enjoys.     Medications: reviewed   Labs: no new labs for review  Anthropometrics: Wt 160 lb 6.4 oz today stable  6/2 - 158 lb 5/9 - 161 lb 12.8 oz   NUTRITION DIAGNOSIS: Inadequate oral intake continues    INTERVENTION:  IR evaluation for possible tube exchange 11/15 Referral placed for swallow study per Dr. Isidore Moos Provided samples of CIB powder as alternate ONS Reviewed ways to alter texture of foods and encouraged soft moist foods - handout + shake recipes provided  Continue 4-5 [employer] 1.4 via tube for weight maintenance as pt continues working to increase oral intake    MONITORING, EVALUATION, GOAL: weight trends, intake, tube feeding   NEXT VISIT: via telephone 3-4 weeks

## 2021-12-25 ENCOUNTER — Ambulatory Visit (INDEPENDENT_AMBULATORY_CARE_PROVIDER_SITE_OTHER): Payer: Medicare Other

## 2021-12-25 DIAGNOSIS — I459 Conduction disorder, unspecified: Secondary | ICD-10-CM | POA: Diagnosis not present

## 2021-12-28 LAB — CUP PACEART REMOTE DEVICE CHECK
Battery Remaining Longevity: 120 mo
Battery Voltage: 3 V
Brady Statistic AP VP Percent: 7.06 %
Brady Statistic AP VS Percent: 0.03 %
Brady Statistic AS VP Percent: 90.66 %
Brady Statistic AS VS Percent: 2.25 %
Brady Statistic RA Percent Paced: 7.01 %
Brady Statistic RV Percent Paced: 97.72 %
Date Time Interrogation Session: 20231122160146
Implantable Lead Connection Status: 753985
Implantable Lead Connection Status: 753985
Implantable Lead Implant Date: 20220523
Implantable Lead Implant Date: 20220523
Implantable Lead Location: 753859
Implantable Lead Location: 753860
Implantable Lead Model: 3830
Implantable Lead Model: 5076
Implantable Pulse Generator Implant Date: 20220523
Lead Channel Impedance Value: 266 Ohm
Lead Channel Impedance Value: 361 Ohm
Lead Channel Impedance Value: 437 Ohm
Lead Channel Impedance Value: 475 Ohm
Lead Channel Pacing Threshold Amplitude: 0.75 V
Lead Channel Pacing Threshold Amplitude: 0.875 V
Lead Channel Pacing Threshold Pulse Width: 0.4 ms
Lead Channel Pacing Threshold Pulse Width: 0.4 ms
Lead Channel Sensing Intrinsic Amplitude: 1.75 mV
Lead Channel Sensing Intrinsic Amplitude: 1.75 mV
Lead Channel Sensing Intrinsic Amplitude: 17.375 mV
Lead Channel Sensing Intrinsic Amplitude: 17.375 mV
Lead Channel Setting Pacing Amplitude: 1.5 V
Lead Channel Setting Pacing Amplitude: 2.5 V
Lead Channel Setting Pacing Pulse Width: 0.4 ms
Lead Channel Setting Sensing Sensitivity: 1.2 mV
Zone Setting Status: 755011
Zone Setting Status: 755011

## 2021-12-31 ENCOUNTER — Telehealth: Payer: Self-pay | Admitting: *Deleted

## 2021-12-31 NOTE — Telephone Encounter (Signed)
RETURNED PATIENT'S PHONE CALL, SPOKE WITH PATIENT. ?

## 2022-01-09 ENCOUNTER — Ambulatory Visit (HOSPITAL_COMMUNITY)
Admission: RE | Admit: 2022-01-09 | Discharge: 2022-01-09 | Disposition: A | Discharge status: Home / Self Care | Payer: Medicare Other | Source: Ambulatory Visit | Attending: Family Medicine | Admitting: Family Medicine

## 2022-01-09 DIAGNOSIS — R079 Chest pain, unspecified: Secondary | ICD-10-CM | POA: Diagnosis not present

## 2022-01-09 DIAGNOSIS — M6250 Muscle wasting and atrophy, not elsewhere classified, unspecified site: Secondary | ICD-10-CM | POA: Diagnosis not present

## 2022-01-09 DIAGNOSIS — R059 Cough, unspecified: Secondary | ICD-10-CM | POA: Insufficient documentation

## 2022-01-09 DIAGNOSIS — R1313 Dysphagia, pharyngeal phase: Secondary | ICD-10-CM | POA: Insufficient documentation

## 2022-01-09 DIAGNOSIS — K219 Gastro-esophageal reflux disease without esophagitis: Secondary | ICD-10-CM | POA: Diagnosis not present

## 2022-01-09 DIAGNOSIS — C01 Malignant neoplasm of base of tongue: Secondary | ICD-10-CM

## 2022-01-09 DIAGNOSIS — R1310 Dysphagia, unspecified: Secondary | ICD-10-CM

## 2022-01-18 NOTE — Progress Notes (Signed)
Remote pacemaker transmission.   

## 2022-02-05 NOTE — Therapy (Signed)
[facility] [address] [place], [place], [address] Phone: [phone]   Fax:  [phone]  Patient Details  Name: Steven Mathews MRN: [id number] Date of Birth: February 11, 1945 Referring Provider:  Eppie Gibson, MD  Encounter Date: 02/05/2022  SPEECH THERAPY DISCHARGE SUMMARY  Visits from Start of Care: 4  Current functional level related to goals / functional outcomes: Goals at pt's last scheduled session in March 2023 follow; Pt cancelled his scheduled appointment in May 2023 and never rescheduled. Harlow Asa, RN indicated pt would like to be discharged after speaking with pt in June 2023.  SLP Short Term Goals - 04/25/21                      SLP SHORT TERM GOAL #1    Title pt will complete HEP with rare min A     Time      Period --   visits, for all STGs    Status Achieved    Target Date 02/16/21            SLP SHORT TERM GOAL #2    Title pt will tell SLP why pt is completing HEP with modified independence     Time        Status Achieved    Target Date 03/16/21            SLP SHORT TERM GOAL #3    Title   Status pt will describe 3 overt s/s aspiration PNA with modified independence  Achieved    Time      Target Date 04/13/21                    SLP SHORT TERM GOAL #4  Title pt will tell SLP how a food journal can facilitate quicker return to more normal POs following rad tx  Time Status   Achieved  Target Date 04/13/21                         SLP Long Term Goals - 04/25/21                      SLP LONG TERM GOAL #1    Title pt will complete HEP with modified independence in 2 sessions     Time       03/28/21    Period --   visits, for all LTGs    Status Achieved    Target Date 05/18/21            SLP LONG TERM GOAL #2    Title pt will describe how to modify HEP over time, and the timeline associated with reduction in HEP frequency with modified independence over two sessions     Time Status  2 Ongoing    Target Date 06/22/21                     Plan - 04/25/21        Clinical Impression Statement Pt's swallow deemed WFL/WNL for dys I-II items and thin liquids based upon pt's performance today. SLP reviewed pt's individualized HEP for dysphagia and pt completed each exercise on their own with modified independence. Steven Mathews continues to perform the HEP at suboptimal frequency. SLP strongly reiterated to pt to complete to recommended frequency and scope. There are no overt s/s aspiration reported by pt at this time. Data indicate  that pt's swallow ability could very well decline over time following conclusion of their radiation therapy due to muscle disuse atrophy and/or muscle fibrosis. Pt will cont to need to be seen by SLP in order to assess safety of PO intake, assess the need for recommending any objective swallow assessment, and ensuring pt correctly completes the individualized HEP. If pt's swallowing cont to look WFL/WNL and his procedure looks WNL for HEP, he may be d/c'd after his visit in May 2023.        Remaining deficits: Assumed that deficits remaining in March 2023 cont.   Education / Equipment: HEP procedure, s/sx aspiration PNA, rationale for HEP. See notes for other details.   Patient agrees to discharge. Patient goals were partially met. Patient is being discharged due to not returning since the last visit.Marland Kitchen    [facility] [place] 02/05/2022, 9:14 AM  [facility] [place] [address], [place], [place], [address] Phone: [phone]   Fax:  [phone]

## 2022-02-20 ENCOUNTER — Ambulatory Visit: Payer: Medicare Other | Attending: Radiation Oncology

## 2022-02-20 ENCOUNTER — Other Ambulatory Visit: Payer: Self-pay

## 2022-02-20 DIAGNOSIS — R1313 Dysphagia, pharyngeal phase: Secondary | ICD-10-CM | POA: Insufficient documentation

## 2022-02-20 NOTE — Therapy (Signed)
OUTPATIENT SPEECH LANGUAGE PATHOLOGY SWALLOW EVALUATION   Patient Name: Steven Mathews MRN: [id number] DOB:1945-07-18, 77 y.o., male Today's Date: 02/20/2022  PCP: Cathlean Sauer, MD REFERRING PROVIDER: Eppie Gibson, MD  END OF SESSION:  End of Session - 02/20/22 1636     Visit Number 1    Number of Visits 9    Date for SLP Re-Evaluation 04/25/22    SLP Start Time 1536    SLP Stop Time  1620    SLP Time Calculation (min) 44 min    Activity Tolerance Patient tolerated treatment well             Past Medical History:  Diagnosis Date   Atrial fibrillation ([place])    CAD (coronary artery disease)    CABG 2008; 03/13/2018 NSTEMI due to acute thrombotic occlusion of RCA graft, patent LIMA to LAD.   Chronic cough    GERD (gastroesophageal reflux disease)    Hx of adenomatous colonic polyps 04/04/2016   Hyperlipidemia    Hypertension    Myocardial infarct ([facility])    Presence of permanent cardiac pacemaker    SVT (supraventricular tachycardia)    Past Surgical History:  Procedure Laterality Date   CORONARY ARTERY BYPASS GRAFT  03/2006   DIRECT LARYNGOSCOPY N/A 12/11/2020   Procedure: DIRECT LARYNGOSCOPY WITH BIOPSY;  Surgeon: Izora Gala, MD;  Location: [place];  Service: ENT;  Laterality: N/A;   ENDOBRONCHIAL ULTRASOUND N/A 01/08/2021   Procedure: ENDOBRONCHIAL ULTRASOUND;  Surgeon: [name], MD;  [facility];  Service: Pulmonary;  Laterality: N/A;   ESOPHAGOSCOPY N/A 12/11/2020   Procedure: ESOPHAGOSCOPY;  Surgeon: Izora Gala, MD;  Location: [place];  Service: ENT;  Laterality: N/A;   FINE NEEDLE ASPIRATION  01/08/2021   Procedure: FINE NEEDLE ASPIRATION;  Surgeon: [name], MD;  [facility];  Service: Pulmonary;;   HERNIA REPAIR  1964   IR CM INJ ANY COLONIC TUBE W/FLUORO  12/20/2021   IR GASTROSTOMY TUBE MOD SED  02/06/2021   LEFT HEART CATH AND CORS/GRAFTS ANGIOGRAPHY N/A 03/13/2018   Procedure: LEFT HEART CATH AND CORS/GRAFTS  ANGIOGRAPHY;  Surgeon: Troy Sine, MD;  Location: [place] [facility];  Service: Cardiovascular;  Laterality: N/A;   PACEMAKER IMPLANT N/A 06/26/2020   Procedure: PACEMAKER IMPLANT;  Surgeon: Evans Lance, MD;  Location: [place] [facility];  Service: Cardiovascular;  Laterality: N/A;   RADICAL NECK DISSECTION Right 12/11/2020   Procedure: RIGHT MODIFIED NECK DISSECTION;  Surgeon: Izora Gala, MD;  Location: [place];  Service: ENT;  Laterality: Right;   SUPRAVENTRICULAR TACHYCARDIA ABLATION N/A 09/03/2012   Procedure: SUPRAVENTRICULAR TACHYCARDIA ABLATION;  Surgeon: Evans Lance, MD;  Location: [facility];  Service: Cardiovascular;  Laterality: N/A;   TONSILLECTOMY Bilateral 12/11/2020   Procedure: BILATERAL TONSILLECTOMY;  Surgeon: Izora Gala, MD;  Location: [place];  Service: ENT;  Laterality: Bilateral;   VIDEO BRONCHOSCOPY  01/08/2021   Procedure: VIDEO BRONCHOSCOPY WITHOUT FLUORO;  Surgeon: [name], MD;  [facility];  Service: Pulmonary;;   Patient Active Problem List   Diagnosis Date Noted   History of radiation to head and neck region 07/06/2021   Loss of weight 07/06/2021   Dysphagia 07/06/2021   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   Encounter for fitting or adjustment of dental prosthetic device 01/10/2021   Hilar lymphadenopathy    Encounter for preoperative dental examination 01/02/2021   Tooth missing 01/02/2021   Gingival recession, generalized 01/02/2021   Defective dental restoration 01/02/2021  Dental attrition, excessive 01/02/2021   Diastema 01/02/2021   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   Malignant neoplasm of base of tongue ([place]) 12/27/2020   Pacemaker 12/19/2020   Neck mass 12/11/2020   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   Bradycardia 08/01/2020   Heart block 06/13/2020   Therapeutic drug monitoring 09/16/2018   Rash 05/25/2018   Essential hypertension 05/25/2018   Educated about COVID-19 virus infection 05/25/2018    Acute ST elevation myocardial infarction (STEMI) due to occlusion of right coronary artery ([place]) 03/13/2018   Hx of CABG    Hx of adenomatous colonic polyps 04/04/2016   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 04/13/2013   SVT (supraventricular tachycardia) ([place]) 09/03/2012   ED (erectile dysfunction) 10/05/2010   PALPITATIONS 08/29/2008   BRONCHITIS, ACUTE 06/24/2008   HYPERLIPIDEMIA 04/14/2008   Acute ST elevation myocardial infarction (STEMI) ([place]) 04/14/2008   PULMONARY INFILTRATE INCLUDES (EOSINOPHILIA) 04/14/2008   Pure hypercholesterolemia 04/14/2008    ONSET DATE: 2022   REFERRING DIAG:  R13.10 (ICD-10-CM) - Dysphagia, unspecified type  C01 (ICD-10-CM) - Malignant neoplasm of base of tongue     THERAPY DIAG:  Dysphagia, pharyngeal phase  Rationale for Evaluation and Treatment: Rehabilitation  SUBJECTIVE:   SUBJECTIVE STATEMENT: "I am going to get this tube out. I can always drink the calories."  Pt accompanied by: self  PERTINENT HISTORY: SLP involved in this pt's care until spring 2023 when he self-discharged from [place]. He completed HEP with suboptimal frequency during and after his chemoRT for base of tongue cancer. Lately he has felt that he has worsening dysphagia. See below for MBS results.  PAIN:  Are you having pain? No Discomfort in throat when eating but not pain.  FALLS: Has patient fallen in last 6 months?  No  LIVING ENVIRONMENT: Lives with: lives with their spouse Lives in: House/apartment  PLOF:  Level of assistance: Independent with ADLs, Independent with IADLs Employment: Self-employed  PATIENT GOALS: Get feeding tube out  OBJECTIVE:   RECOMMENDATIONS FROM OBJECTIVE SWALLOW STUDY (MBSS/FEES):   Regular diet/thin liquids Objective recommended compensations: small sips/bites, remain upright after meals. SLP added begin meals with sip, double swallows, and alternate bite/sip.  Objective swallow impairments:  Patient presents with mild pharyngeal dysphagia c/b impaired tongue base retraction due to iatrogenic effects of XRT and right neck radical dissection.  He was observed consuming thin, nectar, pudding, cracker and tablet with thin.  Retention at vallecular space worse with solids/purees than liquids due to impaired tongue base retraction/pharyngeal contraction. Laryngeal closure is WFL.  Pt sensed retention and used liquids to clear.    In A-P view, retention more on the right than the left - thus various postures including chin tuck nor head turn right/left was helpful to prevent retention.  Liquids always effective to clear.  Trace laryngeal penetration of thin into laryngeal vestibule noted with large sequential swallows of thin liquid.   Prominent CP incidentally noted which did not impair flow of any boluses tested.      Cued expectoration did not clear vallecular retention - but advised pt strengthen this ability to clear solids if needed.  Of note, pt sensed retention when pharynx was clear.       Liquid wash effective to clear All pharyngeal retention.   SLP encouraged pt to continue consuming solid po intake - as he reports discomfort with solid but no issues with liquids.   Advised he consume more than a few bites each meal to decrease  disuse muscle atrophy.  Using teach back, including review of video loops, all education completed.  Advised pt to conduct his stretches and neck massage prior to conducting swallowing exercises to maximize effectiveness of HEP.      COGNITION: Overall cognitive status: Within functional limits for tasks assessed  ORAL MOTOR EXAMINATION: Overall status: Impaired: Lingual: Bilateral (ROM)  CLINICAL SWALLOW ASSESSMENT:   Current diet: regular and thin liquids Dentition: adequate natural dentition Patient directly observed with POs: Yes: dysphagia 1 (puree) and thin liquids  Feeding: able to feed self Liquids provided by: cup Oral phase signs and symptoms:   none noted Pharyngeal phase signs and symptoms: delayed throat clear; double swallows (cued in precautions)  PATIENT REPORTED OUTCOME MEASURES (PROM): EAT-10: to be completed in first 2 sessions   TODAY'S TREATMENT:                                                                                                                                         DATE:   02/20/22 (eval): Educated pt on swallow precautions, rationale for each precaution, begin meals with drink, procedure for HEP, rationale for HEP, 8-10 weeks of HEP necessary to see functional progress with POs, after 10-12 weeks pt will have gained the swallow strength he will have in the future, necessary to cont to perform HEP x2-3/week after 10-12 weeks to maintain strength   PATIENT EDUCATION: Education details: see "today's treatment"  Person educated: Patient Education method: Explanation, Demonstration, Verbal cues, and Handouts Education comprehension: verbalized understanding, returned demonstration, verbal cues required, and needs further education   ASSESSMENT:  CLINICAL IMPRESSION: Patient is a 77 y.o. male who was seen today for assessment of diet safety and introduction of HEP for pharyngeal stage dysphagia due to iatrogenic changes after chemoRT completed Mar 13, 2021. Originally, pt performed HEP at suboptimal levels during and after chemoRT in late 2022-early 2023 and self-discharged from [place] after cancelling a scheduled ST appointment with this SLP in May 2023. He hopes to have feeding tube removed after maintaining his weight for a specific amount of time.  OBJECTIVE IMPAIRMENTS: include dysphagia. These impairments are limiting patient from safety when swallowing. Factors affecting potential to achieve goals and functional outcome are cooperation/participation level and severity of impairments. Patient will benefit from skilled SLP services to address above impairments and improve overall function.  REHAB POTENTIAL:  Fair due to time post chemoRT   GOALS: Goals reviewed with patient? Yes  SHORT TERM GOALS: Target date: 03/23/22  Pt perform HEP with modified independence in 2 sessions Baseline: Goal status: INITIAL  2.  Pt follow recommended swallow precautions with POs  Baseline:  Goal status: INITIAL  3.  Pt tell SLP rationale for HEP in two sessions Baseline:  Goal status: INITIAL   LONG TERM GOALS: Target date: 04/25/22  Pt perform HEP with modified independence in 2 sessions after 03-23-22 Baseline: Goal status: INITIAL  Pt follow recommended swallow precautions with POs in 3 sessions Baseline:  Goal status: INITIAL  Pt score higher/better with PROM than initial score Baseline:  Goal status: INITIAL  PLAN:  SLP FREQUENCY: 1x/week  SLP DURATION: 8 weeks  PLANNED INTERVENTIONS: Aspiration precaution training, Pharyngeal strengthening exercises, Diet toleration management , Trials of upgraded texture/liquids, Cueing hierachy, Internal/external aids, Oral motor exercises, SLP instruction and feedback, Compensatory strategies, and Patient/family education    [facility], [place] 02/20/2022, 4:37 PM

## 2022-02-20 NOTE — Patient Instructions (Addendum)
SWALLOWING EXERCISES Do these for at LEAST 10 weeks, then 2-3 times per week afterwards  Effortful Swallows - Press your tongue against the roof of your mouth for 3 seconds, then squeeze hard while you swallow your saliva or a sip of water - Do at LEAST 30 reps/day - never less than 5 reps at a time  Masako Swallow - swallow with your tongue sticking out - Stick tongue out past your lips and gently bite tongue with your teeth - Swallow, while holding your tongue with your teeth - Do at LEAST 30 reps/day - never less than 5 reps at a time *use a wet spoon if your mouth gets dry*  "HOCK" exercise - pretend as if you are attempting to get phlegm up from your throat in order to "spit" - do 20 reps/day - never less than 5 reps at a time   =============== With every meal DOUBLE SWALLOW TAKE SMALL BITES AND SIPS ALTERNATE BITES AND SIPS  *Use more gravies, sauces, condiments, and dressings in order to gather the solids going down your throat   Remember caffeinated drinks dry out the mouth even more

## 2022-02-28 ENCOUNTER — Ambulatory Visit: Payer: Medicare Other

## 2022-02-28 DIAGNOSIS — R1313 Dysphagia, pharyngeal phase: Secondary | ICD-10-CM

## 2022-02-28 NOTE — Therapy (Addendum)
OUTPATIENT SPEECH LANGUAGE PATHOLOGY SWALLOW TREATMENT   Patient Name: Steven Mathews MRN: [id number] DOB:01-14-1946, 77 y.o., male Today's Date: 02/28/2022  PCP: Cathlean Sauer, MD REFERRING PROVIDER: Eppie Gibson, MD  END OF SESSION:  End of Session - 02/28/22 1602     Visit Number 2    Number of Visits 9    Date for SLP Re-Evaluation 04/25/22    SLP Start Time 1533    SLP Stop Time  8250    SLP Time Calculation (min) 24 min    Activity Tolerance Patient tolerated treatment well              Past Medical History:  Diagnosis Date   Atrial fibrillation ([place])    CAD (coronary artery disease)    CABG 2008; 03/13/2018 NSTEMI due to acute thrombotic occlusion of RCA graft, patent LIMA to LAD.   Chronic cough    GERD (gastroesophageal reflux disease)    Hx of adenomatous colonic polyps 04/04/2016   Hyperlipidemia    Hypertension    Myocardial infarct ([facility])    Presence of permanent cardiac pacemaker    SVT (supraventricular tachycardia)    Past Surgical History:  Procedure Laterality Date   CORONARY ARTERY BYPASS GRAFT  03/2006   DIRECT LARYNGOSCOPY N/A 12/11/2020   Procedure: DIRECT LARYNGOSCOPY WITH BIOPSY;  Surgeon: Izora Gala, MD;  Location: [place];  Service: ENT;  Laterality: N/A;   ENDOBRONCHIAL ULTRASOUND N/A 01/08/2021   Procedure: ENDOBRONCHIAL ULTRASOUND;  Surgeon: [name], MD;  [facility];  Service: Pulmonary;  Laterality: N/A;   ESOPHAGOSCOPY N/A 12/11/2020   Procedure: ESOPHAGOSCOPY;  Surgeon: Izora Gala, MD;  Location: [place];  Service: ENT;  Laterality: N/A;   FINE NEEDLE ASPIRATION  01/08/2021   Procedure: FINE NEEDLE ASPIRATION;  Surgeon: [name], MD;  [facility];  Service: Pulmonary;;   HERNIA REPAIR  1964   IR CM INJ ANY COLONIC TUBE W/FLUORO  12/20/2021   IR GASTROSTOMY TUBE MOD SED  02/06/2021   LEFT HEART CATH AND CORS/GRAFTS ANGIOGRAPHY N/A 03/13/2018   Procedure: LEFT HEART CATH AND CORS/GRAFTS  ANGIOGRAPHY;  Surgeon: Troy Sine, MD;  Location: [place] [facility];  Service: Cardiovascular;  Laterality: N/A;   PACEMAKER IMPLANT N/A 06/26/2020   Procedure: PACEMAKER IMPLANT;  Surgeon: Evans Lance, MD;  Location: [place] [facility];  Service: Cardiovascular;  Laterality: N/A;   RADICAL NECK DISSECTION Right 12/11/2020   Procedure: RIGHT MODIFIED NECK DISSECTION;  Surgeon: Izora Gala, MD;  Location: [place];  Service: ENT;  Laterality: Right;   SUPRAVENTRICULAR TACHYCARDIA ABLATION N/A 09/03/2012   Procedure: SUPRAVENTRICULAR TACHYCARDIA ABLATION;  Surgeon: Evans Lance, MD;  Location: [facility];  Service: Cardiovascular;  Laterality: N/A;   TONSILLECTOMY Bilateral 12/11/2020   Procedure: BILATERAL TONSILLECTOMY;  Surgeon: Izora Gala, MD;  Location: [place];  Service: ENT;  Laterality: Bilateral;   VIDEO BRONCHOSCOPY  01/08/2021   Procedure: VIDEO BRONCHOSCOPY WITHOUT FLUORO;  Surgeon: [name], MD;  [facility];  Service: Pulmonary;;   Patient Active Problem List   Diagnosis Date Noted   History of radiation to head and neck region 07/06/2021   Loss of weight 07/06/2021   Dysphagia 07/06/2021   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   Encounter for fitting or adjustment of dental prosthetic device 01/10/2021   Hilar lymphadenopathy    Encounter for preoperative dental examination 01/02/2021   Tooth missing 01/02/2021   Gingival recession, generalized 01/02/2021   Defective dental restoration 01/02/2021  Dental attrition, excessive 01/02/2021   Diastema 01/02/2021   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   Malignant neoplasm of base of tongue ([place]) 12/27/2020   Pacemaker 12/19/2020   Neck mass 12/11/2020   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   Bradycardia 08/01/2020   Heart block 06/13/2020   Therapeutic drug monitoring 09/16/2018   Rash 05/25/2018   Essential hypertension 05/25/2018   Educated about COVID-19 virus infection 05/25/2018    Acute ST elevation myocardial infarction (STEMI) due to occlusion of right coronary artery ([place]) 03/13/2018   Hx of CABG    Hx of adenomatous colonic polyps 04/04/2016   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 04/13/2013   SVT (supraventricular tachycardia) ([place]) 09/03/2012   ED (erectile dysfunction) 10/05/2010   PALPITATIONS 08/29/2008   BRONCHITIS, ACUTE 06/24/2008   HYPERLIPIDEMIA 04/14/2008   Acute ST elevation myocardial infarction (STEMI) ([place]) 04/14/2008   PULMONARY INFILTRATE INCLUDES (EOSINOPHILIA) 04/14/2008   Pure hypercholesterolemia 04/14/2008    ONSET DATE: 2022   REFERRING DIAG:  R13.10 (ICD-10-CM) - Dysphagia, unspecified type  C01 (ICD-10-CM) - Malignant neoplasm of base of tongue     THERAPY DIAG:  Dysphagia, pharyngeal phase  Rationale for Evaluation and Treatment: Rehabilitation  SUBJECTIVE:   SUBJECTIVE STATEMENT: "I am between one and two meals a day."  Pt accompanied by: self  PAIN:  Are you having pain? No Discomfort in throat when eating but not pain.  PATIENT GOALS: Get feeding tube out  OBJECTIVE:   RECOMMENDATIONS FROM OBJECTIVE SWALLOW STUDY (MBSS/FEES):   Regular diet/thin liquids Objective recommended compensations: small sips/bites, remain upright after meals. SLP added begin meals with sip, double swallows, and alternate bite/sip.  Objective swallow impairments: Patient presents with mild pharyngeal dysphagia c/b impaired tongue base retraction due to iatrogenic effects of XRT and right neck radical dissection.  He was observed consuming thin, nectar, pudding, cracker and tablet with thin.  Retention at vallecular space worse with solids/purees than liquids due to impaired tongue base retraction/pharyngeal contraction. Laryngeal closure is WFL.  Pt sensed retention and used liquids to clear.    In A-P view, retention more on the right than the left - thus various postures including chin tuck nor head turn  right/left was helpful to prevent retention.  Liquids always effective to clear.  Trace laryngeal penetration of thin into laryngeal vestibule noted with large sequential swallows of thin liquid.   Prominent CP incidentally noted which did not impair flow of any boluses tested.      Cued expectoration did not clear vallecular retention - but advised pt strengthen this ability to clear solids if needed.  Of note, pt sensed retention when pharynx was clear.       Liquid wash effective to clear All pharyngeal retention.   SLP encouraged pt to continue consuming solid po intake - as he reports discomfort with solid but no issues with liquids.   Advised he consume more than a few bites each meal to decrease disuse muscle atrophy.  Using teach back, including review of video loops, all education completed.  Advised pt to conduct his stretches and neck massage prior to conducting swallowing exercises to maximize effectiveness of HEP.    PATIENT REPORTED OUTCOME MEASURES (PROM): EAT-10: to be completed in first 2 sessions   TODAY'S TREATMENT:  DATE:   02/28/22: Pt is using effortful swallow with meals. SLP told pt he could eliminate effortful swallow for HEP if this was occurring. Pt ate POs without cues necessary, no overt s/s of oral or pharyngeal difficulties using smaller bites and sips with peanut butter crackers. HEP was completed with SBA. If pt's progress cont to next session, every other week would be appropriate.  Pt is going to confer with [facility] to see if there is a drinkable option for his tube feeds so he can be 100% PO, as he is now using 2-3 cartons of tube feed/day and is "maintaining, maybe gaining a little" with his current caloric regimen (PO breakfast and dinner and tube feed midday).  02/20/22 (eval): Educated pt on swallow precautions, rationale for  each precaution, begin meals with drink, procedure for HEP, rationale for HEP, 8-10 weeks of HEP necessary to see functional progress with POs, after 10-12 weeks pt will have gained the swallow strength he will have in the future, necessary to cont to perform HEP x2-3/week after 10-12 weeks to maintain strength   PATIENT EDUCATION: Education details: see "today's treatment"  Person educated: Patient Education method: Explanation, Demonstration, Verbal cues, and Handouts Education comprehension: verbalized understanding, returned demonstration, verbal cues required, and needs further education   ASSESSMENT:  CLINICAL IMPRESSION: Patient is a 77 y.o. 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 Originally, pt performed HEP at suboptimal levels during and after chemoRT in late 2022-early 2023 and self-discharged from [place] after cancelling a scheduled ST appointment with this SLP in May 2023. He hopes to have feeding tube removed after maintaining his weight for a specific amount of time.  OBJECTIVE IMPAIRMENTS: include dysphagia. These impairments are limiting patient from safety when swallowing. Factors affecting potential to achieve goals and functional outcome are cooperation/participation level and severity of impairments. Patient will benefit from skilled SLP services to address above impairments and improve overall function.  REHAB POTENTIAL: Fair due to time post chemoRT   GOALS: Goals reviewed with patient? Yes  SHORT TERM GOALS: Target date: 03/23/22  Pt perform HEP with modified independence in 2 sessions Baseline: Goal status: Ongoing  2.  Pt follow recommended swallow precautions with POs  Baseline:  Goal status: Ongoing  3.  Pt tell SLP rationale for HEP in two sessions Baseline:  Goal status: Ongoing   LONG TERM GOALS: Target date: 04/25/22  Pt perform HEP  with modified independence in 2 sessions after 03-23-22 Baseline: Goal status: Ongoing  Pt follow recommended swallow precautions with POs in 3 sessions Baseline:  Goal status: Ongoing  Pt score higher/better with PROM than initial score Baseline:  Goal status: Ongoing  PLAN:  SLP FREQUENCY: 1x/week  SLP DURATION: 8 weeks  PLANNED INTERVENTIONS: Aspiration precaution training, Pharyngeal strengthening exercises, Diet toleration management , Trials of upgraded texture/liquids, Cueing hierachy, Internal/external aids, Oral motor exercises, SLP instruction and feedback, Compensatory strategies, and Patient/family education    [facility], [place] 02/28/2022, 4:03 PM

## 2022-03-06 ENCOUNTER — Ambulatory Visit: Payer: Medicare Other

## 2022-03-14 ENCOUNTER — Ambulatory Visit: Payer: Medicare Other | Admitting: Nutrition

## 2022-03-14 ENCOUNTER — Ambulatory Visit: Payer: Medicare Other | Attending: Radiation Oncology

## 2022-03-14 DIAGNOSIS — R1313 Dysphagia, pharyngeal phase: Secondary | ICD-10-CM | POA: Insufficient documentation

## 2022-03-14 NOTE — Therapy (Addendum)
OUTPATIENT SPEECH LANGUAGE PATHOLOGY SWALLOW TREATMENT   Patient Name: Steven Mathews MRN: [id number] DOB:[birth year], 77 y.o., male Today's Date: 03/14/2022  PCP: Cathlean Sauer, MD REFERRING PROVIDER: Eppie Gibson, MD  END OF SESSION:  End of Session - 03/14/22 1610     Visit Number 3    Number of Visits 9    Date for SLP Re-Evaluation 04/25/22    SLP Start Time 1533    SLP Stop Time  1555    SLP Time Calculation (min) 22 min    Activity Tolerance Patient tolerated treatment well               Past Medical History:  Diagnosis Date   Atrial fibrillation ([place])    CAD (coronary artery disease)    CABG 2008; 03/13/2018 NSTEMI due to acute thrombotic occlusion of RCA graft, patent LIMA to LAD.   Chronic cough    GERD (gastroesophageal reflux disease)    Hx of adenomatous colonic polyps 04/04/2016   Hyperlipidemia    Hypertension    Myocardial infarct ([facility])    Presence of permanent cardiac pacemaker    SVT (supraventricular tachycardia)    Past Surgical History:  Procedure Laterality Date   CORONARY ARTERY BYPASS GRAFT  03/2006   DIRECT LARYNGOSCOPY N/A 12/11/2020   Procedure: DIRECT LARYNGOSCOPY WITH BIOPSY;  Surgeon: Izora Gala, MD;  Location: [facility];  Service: ENT;  Laterality: N/A;   ENDOBRONCHIAL ULTRASOUND N/A 01/08/2021   Procedure: ENDOBRONCHIAL ULTRASOUND;  Surgeon: [name], MD;  Location: [facility];  Service: Pulmonary;  Laterality: N/A;   ESOPHAGOSCOPY N/A 12/11/2020   Procedure: ESOPHAGOSCOPY;  Surgeon: Izora Gala, MD;  Location: [facility];  Service: ENT;  Laterality: N/A;   FINE NEEDLE ASPIRATION  01/08/2021   Procedure: FINE NEEDLE ASPIRATION;  Surgeon: [name], MD;  Location: [facility];  Service: Pulmonary;;   HERNIA REPAIR  1964   IR CM INJ ANY COLONIC TUBE W/FLUORO  12/20/2021   IR GASTROSTOMY TUBE MOD SED  02/06/2021   LEFT HEART CATH AND CORS/GRAFTS ANGIOGRAPHY N/A 03/13/2018   Procedure: LEFT HEART CATH AND CORS/GRAFTS  ANGIOGRAPHY;  Surgeon: Troy Sine, MD;  Location: [facility];  Service: Cardiovascular;  Laterality: N/A;   PACEMAKER IMPLANT N/A 06/26/2020   Procedure: PACEMAKER IMPLANT;  Surgeon: Evans Lance, MD;  Location: [facility];  Service: Cardiovascular;  Laterality: N/A;   RADICAL NECK DISSECTION Right 12/11/2020   Procedure: RIGHT MODIFIED NECK DISSECTION;  Surgeon: Izora Gala, MD;  Location: [place];  Service: ENT;  Laterality: Right;   SUPRAVENTRICULAR TACHYCARDIA ABLATION N/A 09/03/2012   Procedure: SUPRAVENTRICULAR TACHYCARDIA ABLATION;  Surgeon: Evans Lance, MD;  [facility];  Service: Cardiovascular;  Laterality: N/A;   TONSILLECTOMY Bilateral 12/11/2020   Procedure: BILATERAL TONSILLECTOMY;  Surgeon: Izora Gala, MD;  Location: [place];  Service: ENT;  Laterality: Bilateral;   VIDEO BRONCHOSCOPY  01/08/2021   Procedure: VIDEO BRONCHOSCOPY WITHOUT FLUORO;  Surgeon: [name], MD;  Location: [facility];  Service: Pulmonary;;   Patient Active Problem List   Diagnosis Date Noted   History of radiation to head and neck region 07/06/2021   Loss of weight 07/06/2021   Dysphagia 07/06/2021   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   Encounter for fitting or adjustment of dental prosthetic device 01/10/2021   Hilar lymphadenopathy    Encounter for preoperative dental examination 01/02/2021   Tooth missing 01/02/2021   Gingival recession, generalized 01/02/2021   Defective dental restoration 01/02/2021  Dental attrition, excessive 01/02/2021   Diastema 01/02/2021   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   Malignant neoplasm of base of tongue ([place]) 12/27/2020   Pacemaker 12/19/2020   Neck mass 12/11/2020   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   Bradycardia 08/01/2020   Heart block 06/13/2020   Therapeutic drug monitoring 09/16/2018   Rash 05/25/2018   Essential hypertension 05/25/2018   Educated about COVID-19 virus infection 05/25/2018    Acute ST elevation myocardial infarction (STEMI) due to occlusion of right coronary artery ([place]) 03/13/2018   Hx of CABG    Hx of adenomatous colonic polyps 04/04/2016   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 04/13/2013   SVT (supraventricular tachycardia) ([place]) 09/03/2012   ED (erectile dysfunction) 10/05/2010   PALPITATIONS 08/29/2008   BRONCHITIS, ACUTE 06/24/2008   HYPERLIPIDEMIA 04/14/2008   Acute ST elevation myocardial infarction (STEMI) ([place]) 04/14/2008   PULMONARY INFILTRATE INCLUDES (EOSINOPHILIA) 04/14/2008   Pure hypercholesterolemia 04/14/2008    ONSET DATE: 2022   REFERRING DIAG:  R13.10 (ICD-10-CM) - Dysphagia, unspecified type  C01 (ICD-10-CM) - Malignant neoplasm of base of tongue     THERAPY DIAG:  R13.10 (ICD-10-CM) - Dysphagia, unspecified type    Rationale for Evaluation and Treatment: Rehabilitation  SUBJECTIVE:   SUBJECTIVE STATEMENT: ""I got in touch with Dory Peru and I'm keeping a food journal now and meeting with her next week."  Pt accompanied by: self  PAIN:  Are you having pain? No Discomfort in throat when eating but not pain.  PATIENT GOALS: Get feeding tube out  OBJECTIVE:   RECOMMENDATIONS FROM OBJECTIVE SWALLOW STUDY (MBSS/FEES):   Regular diet/thin liquids Objective recommended compensations: small sips/bites, remain upright after meals. SLP added begin meals with sip, double swallows, and alternate bite/sip.  Objective swallow impairments: Patient presents with mild pharyngeal dysphagia c/b impaired tongue base retraction due to iatrogenic effects of XRT and right neck radical dissection.  He was observed consuming thin, nectar, pudding, cracker and tablet with thin.  Retention at vallecular space worse with solids/purees than liquids due to impaired tongue base retraction/pharyngeal contraction. Laryngeal closure is WFL.  Pt sensed retention and used liquids to clear.    In A-P view, retention more  on the right than the left - thus various postures including chin tuck nor head turn right/left was helpful to prevent retention.  Liquids always effective to clear.  Trace laryngeal penetration of thin into laryngeal vestibule noted with large sequential swallows of thin liquid.   Prominent CP incidentally noted which did not impair flow of any boluses tested.      Cued expectoration did not clear vallecular retention - but advised pt strengthen this ability to clear solids if needed.  Of note, pt sensed retention when pharynx was clear.       Liquid wash effective to clear All pharyngeal retention.   SLP encouraged pt to continue consuming solid po intake - as he reports discomfort with solid but no issues with liquids.   Advised he consume more than a few bites each meal to decrease disuse muscle atrophy.  Using teach back, including review of video loops, all education completed.  Advised pt to conduct his stretches and neck massage prior to conducting swallowing exercises to maximize effectiveness of HEP.    PATIENT REPORTED OUTCOME MEASURES (PROM): EAT-10: completed next visit   TODAY'S TREATMENT:  DATE:   03/14/22: Needs to do EAT-10 next visit. Pt is eating more now that he is keeping a food journal. Eating filet mignon, salad, eggs, potatoes, sourdough rolls. Reports significant xerostomia and has started decaf coffee instead of caffeinated as recommended last session. Is completing HEP 20-25 reps/day for effortful and [place], and 20 hocks/day. SLP reiterated at elast 30 reps for Masako and effortful are what was prescribed, and congratulated pt on 20 hocks/day. Today pt was independent with HEP.  With POs, pt ate peanut butter crackers and drank water. He req'd cue for taking a sip to begin meal. SLP provided rationale for this for pt to remind him why this was  important. He req'd initial cue for double swallows for every bite/sip. By end of PO trials pt was independent.  02/28/22: Pt is using effortful swallow with meals. SLP told pt he could eliminate effortful swallow for HEP if this was occurring. Pt ate POs without cues necessary, no overt s/s of oral or pharyngeal difficulties using smaller bites and sips with peanut butter crackers. HEP was completed with SBA. If pt's progress cont to next session, every other week would be appropriate.  Pt is going to confer with [facility] to see if there is a drinkable option for his tube feeds so he can be 100% PO, as he is now using 2-3 cartons of tube feed/day and is "maintaining, maybe gaining a little" with his current caloric regimen (PO breakfast and dinner and tube feed midday).  02/20/22 (eval): Educated pt on swallow precautions, rationale for each precaution, begin meals with drink, procedure for HEP, rationale for HEP, 8-10 weeks of HEP necessary to see functional progress with POs, after 10-12 weeks pt will have gained the swallow strength he will have in the future, necessary to cont to perform HEP x2-3/week after 10-12 weeks to maintain strength   PATIENT EDUCATION: Education details: see "today's treatment"  Person educated: Patient Education method: Explanation, Demonstration, Verbal cues, and Handouts Education comprehension: verbalized understanding, returned demonstration, verbal cues required, and needs further education   ASSESSMENT:  CLINICAL IMPRESSION: Patient is a 77 y.o. 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 SEE TX NOTE FOR DETAILS. Originally, pt performed HEP at suboptimal levels during and after chemoRT in late 2022-early 2023 and self-discharged from [place] after cancelling a scheduled ST appointment with this SLP in May 2023. He hopes to have feeding tube removed after  maintaining his weight for a specific amount of time.  OBJECTIVE IMPAIRMENTS: include dysphagia. These impairments are limiting patient from safety when swallowing. Factors affecting potential to achieve goals and functional outcome are cooperation/participation level and severity of impairments. Patient will benefit from skilled SLP services to address above impairments and improve overall function.  REHAB POTENTIAL: Fair due to time post chemoRT   GOALS: Goals reviewed with patient? Yes  SHORT TERM GOALS: Target date: 03/23/22  Pt perform HEP with modified independence in 2 sessions Baseline: Goal status: Ongoing  2.  Pt follow recommended swallow precautions with POs  Baseline:  Goal status: Ongoing  3.  Pt tell SLP rationale for HEP in two sessions Baseline: 03/14/22 Goal status: Ongoing   LONG TERM GOALS: Target date: 04/25/22  Pt perform HEP with modified independence in 2 sessions after 03-23-22 Baseline: Goal status: Ongoing  Pt follow recommended swallow precautions with POs in 3 sessions Baseline:  Goal status:  Ongoing  Pt score higher/better with PROM than initial score Baseline:  Goal status: Ongoing  PLAN:  SLP FREQUENCY: 1x/week  SLP DURATION: 8 weeks  PLANNED INTERVENTIONS: Aspiration precaution training, Pharyngeal strengthening exercises, Diet toleration management , Trials of upgraded texture/liquids, Cueing hierachy, Internal/external aids, Oral motor exercises, SLP instruction and feedback, Compensatory strategies, and Patient/family education    [facility], [facility] 03/14/2022, 4:11 PM

## 2022-03-14 NOTE — Progress Notes (Signed)
Patient contacted this RD requesting assistance with weaning off feeding tube.  Patient is familiar from previous treatments for tongue cancer.  Patient last talked to another RD on December 18, 2021.  Weight at that time was stable at 160 pounds 6.4 ounces.  He was using 4-5 cartons [employer] 1.4 via feeding tube and trying to increase soft foods.  At that point patient was lost to follow-up.  Patient does not have a current weight on file.  He reports he has been eating some eggs and some pasta.  He has tried to drink the [employer] however it tastes terrible.  He would like to know what nutrition shake would be best for him to promote weight gain and wean from tube feeding.  Nutrition diagnosis: Inadequate oral intake continues.  Intervention: Educated patient to consume a total of 4-5 cartons of either [employer] 1.4 via feeding tube or oral nutrition supplements such as Ensure complete for [employer] 1.4, vanilla.  I have provided samples. Encourage patient to keep food journal to document oral intake. Patient pleased with plan and agrees to telephone follow-up next week.  Monitoring, evaluation, goals: Patient will work to increase oral intake so tube feeding can be weaned.  Next visit: Telephone consult scheduled forThursday, February 15 at 1245.  **Disclaimer: This note was dictated with voice recognition software. Similar sounding words can inadvertently be transcribed and this note may contain transcription errors which may not have been corrected upon publication of note.**

## 2022-03-21 ENCOUNTER — Ambulatory Visit: Payer: Medicare Other

## 2022-03-21 ENCOUNTER — Inpatient Hospital Stay: Payer: Medicare Other | Attending: Nutrition | Admitting: Nutrition

## 2022-03-21 NOTE — Progress Notes (Signed)
Nutrition follow-up completed with patient over the telephone.  Patient still has goal of having his feeding tube removed.  Reports weight at home fluctuates between 155 pounds and 157 pounds.  He has reduced [employer] 1.4 to 3 cartons via feeding tube daily.  He has been trying to eat a little bit more food at breakfast and dinner.  Reports he has not tried samples of oral nutrition supplements yet but is getting ready to do that today.  He continues to work with speech therapist on improving swallowing function.  Nutrition diagnosis: Inadequate oral intake continues.  Intervention: Encourage patient to find an oral nutrition supplement with equivalent calories to Ensure Plus or [employer] 1.4 if he doesn't like the samples provided. Suggested he try to drink 3 of these by mouth daily and monitor weight.  If patient is able to sustain weight with oral intake alone, patient may be appropriate for removal of feeding tube.  Monitoring, evaluation, goals: Patient will work to increase oral intake so tube feeding can be weaned and feeding tube removed.  Next visit: Will schedule phone follow-up in approximately 1 month.  **Disclaimer: This note was dictated with voice recognition software. Similar sounding words can inadvertently be transcribed and this note may contain transcription errors which may not have been corrected upon publication of note.**

## 2022-03-28 ENCOUNTER — Ambulatory Visit: Payer: Medicare Other

## 2022-04-04 ENCOUNTER — Ambulatory Visit: Payer: Medicare Other

## 2022-04-16 ENCOUNTER — Ambulatory Visit: Payer: Medicare Other | Attending: Radiation Oncology

## 2022-04-16 DIAGNOSIS — R1313 Dysphagia, pharyngeal phase: Secondary | ICD-10-CM

## 2022-04-17 NOTE — Therapy (Signed)
OUTPATIENT SPEECH LANGUAGE PATHOLOGY SWALLOW TREATMENT/discharge summary   Patient Name: Steven Mathews MRN: [id number] DOB:11-12-1945, 77 y.o., male Today's Date: 04/17/2022  PCP: Cathlean Sauer, MD REFERRING PROVIDER: Eppie Gibson, MD  END OF SESSION:  End of Session - 04/17/22 0027     Visit Number 4    Number of Visits 9    Date for SLP Re-Evaluation 04/25/22    SLP Start Time 1452    SLP Stop Time  1525    SLP Time Calculation (min) 33 min    Activity Tolerance Patient tolerated treatment well                Past Medical History:  Diagnosis Date   Atrial fibrillation ([place])    CAD (coronary artery disease)    CABG 2008; 03/13/2018 NSTEMI due to acute thrombotic occlusion of RCA graft, patent LIMA to LAD.   Chronic cough    GERD (gastroesophageal reflux disease)    Hx of adenomatous colonic polyps 04/04/2016   Hyperlipidemia    Hypertension    Myocardial infarct (HCC)    Presence of permanent cardiac pacemaker    SVT (supraventricular tachycardia)    Past Surgical History:  Procedure Laterality Date   CORONARY ARTERY BYPASS GRAFT  03/2006   DIRECT LARYNGOSCOPY N/A 12/11/2020   Procedure: DIRECT LARYNGOSCOPY WITH BIOPSY;  Surgeon: Izora Gala, MD;  Location: [facility];  Service: ENT;  Laterality: N/A;   ENDOBRONCHIAL ULTRASOUND N/A 01/08/2021   Procedure: ENDOBRONCHIAL ULTRASOUND;  Surgeon: [name], MD;  Location: [facility];  Service: Pulmonary;  Laterality: N/A;   ESOPHAGOSCOPY N/A 12/11/2020   Procedure: ESOPHAGOSCOPY;  Surgeon: Izora Gala, MD;  Location: [facility];  Service: ENT;  Laterality: N/A;   FINE NEEDLE ASPIRATION  01/08/2021   Procedure: FINE NEEDLE ASPIRATION;  Surgeon: [name], MD;  Location: [facility];  Service: Pulmonary;;   HERNIA REPAIR  1964   IR CM INJ ANY COLONIC TUBE W/FLUORO  12/20/2021   IR GASTROSTOMY TUBE MOD SED  02/06/2021   LEFT HEART CATH AND CORS/GRAFTS ANGIOGRAPHY N/A 03/13/2018   Procedure: LEFT HEART CATH AND  CORS/GRAFTS ANGIOGRAPHY;  Surgeon: Troy Sine, MD;  Location: [facility];  Service: Cardiovascular;  Laterality: N/A;   PACEMAKER IMPLANT N/A 06/26/2020   Procedure: PACEMAKER IMPLANT;  Surgeon: Evans Lance, MD;  Location: [facility];  Service: Cardiovascular;  Laterality: N/A;   RADICAL NECK DISSECTION Right 12/11/2020   Procedure: RIGHT MODIFIED NECK DISSECTION;  Surgeon: Izora Gala, MD;  Location: [facility];  Service: ENT;  Laterality: Right;   SUPRAVENTRICULAR TACHYCARDIA ABLATION N/A 09/03/2012   Procedure: SUPRAVENTRICULAR TACHYCARDIA ABLATION;  Surgeon: Evans Lance, MD;  Location: [facility];  Service: Cardiovascular;  Laterality: N/A;   TONSILLECTOMY Bilateral 12/11/2020   Procedure: BILATERAL TONSILLECTOMY;  Surgeon: Izora Gala, MD;  Location: [facility];  Service: ENT;  Laterality: Bilateral;   VIDEO BRONCHOSCOPY  01/08/2021   Procedure: VIDEO BRONCHOSCOPY WITHOUT FLUORO;  Surgeon: [name], MD;  Location: [facility];  Service: Pulmonary;;   Patient Active Problem List   Diagnosis Date Noted   History of radiation to head and neck region 07/06/2021   Loss of weight 07/06/2021   Dysphagia 07/06/2021   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   Encounter for fitting or adjustment of dental prosthetic device 01/10/2021   Hilar lymphadenopathy    Encounter for preoperative dental examination 01/02/2021   Tooth missing 01/02/2021   Gingival recession, generalized 01/02/2021   Defective dental  restoration 01/02/2021   Dental attrition, excessive 01/02/2021   Diastema 01/02/2021   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   Malignant neoplasm of base of tongue ([place]) 12/27/2020   Pacemaker 12/19/2020   Neck mass 12/11/2020   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   Bradycardia 08/01/2020   Heart block 06/13/2020   Therapeutic drug monitoring 09/16/2018   Rash 05/25/2018   Essential hypertension 05/25/2018   Educated about COVID-19 virus infection  05/25/2018   Acute ST elevation myocardial infarction (STEMI) due to occlusion of right coronary artery ([place]) 03/13/2018   Hx of CABG    Hx of adenomatous colonic polyps 04/04/2016   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   Remaining deficits: See below. Mild dysphagia remains moreso with drier foods.   Education / Equipment: See pt therapy notes.   Patient agrees to discharge. Patient goals were partially met. Patient is being discharged due to being pleased with the current functional level..    ONSET DATE: 2022   REFERRING DIAG:  R13.10 (ICD-10-CM) - Dysphagia, unspecified type  C01 (ICD-10-CM) - Malignant neoplasm of base of tongue     THERAPY DIAG:  R13.10 (ICD-10-CM) - Dysphagia, unspecified type    Rationale for Evaluation and Treatment: Rehabilitation  SUBJECTIVE:   SUBJECTIVE STATEMENT: "I have a journal of food for you. Raford Pitcher is impressed with me." Accompanied by: self  PAIN:  Are you having pain? No Discomfort in throat when eating but not pain.  PATIENT GOALS: Get feeding tube out  OBJECTIVE:   RECOMMENDATIONS FROM OBJECTIVE SWALLOW STUDY (MBSS/FEES):   Regular diet/thin liquids Objective recommended compensations: small sips/bites, remain upright after meals. SLP added begin meals with sip, double swallows, and alternate bite/sip.  Objective swallow impairments: Patient presents with mild pharyngeal dysphagia c/b  impaired tongue base retraction due to iatrogenic effects of XRT and right neck radical dissection.  He was observed consuming thin, nectar, pudding, cracker and tablet with thin.  Retention at vallecular space worse with solids/purees than liquids due to impaired tongue base retraction/pharyngeal contraction. Laryngeal closure is WFL.  Pt sensed retention and used liquids to clear.    In A-P view, retention more on the right than the left - thus various postures including chin tuck nor head turn right/left was helpful to prevent retention.  Liquids always effective to clear.  Trace laryngeal penetration of thin into laryngeal vestibule noted with large sequential swallows of thin liquid.   Prominent CP incidentally noted which did not impair flow of any boluses tested.      Cued expectoration did not clear vallecular retention - but advised pt strengthen this ability to clear solids if needed.  Of note, pt sensed retention when pharynx was clear.       Liquid wash effective to clear All pharyngeal retention.   SLP encouraged pt to continue consuming solid po intake - as he reports discomfort with solid but no issues with liquids.   Advised he consume more than a few bites each meal to decrease disuse muscle atrophy.  Using teach  back, including review of video loops, all education completed.  Advised pt to conduct his stretches and neck massage prior to conducting swallowing exercises to maximize effectiveness of HEP.    PATIENT REPORTED OUTCOME MEASURES (PROM): EAT-10: was not completed   TODAY'S TREATMENT:                                                                                                                                         DATE:  04/16/22: Pt left inadvertently without completing EAT-10. He completed HEP with independence. He ate applesauce, peanut butter crackers, and a fig bar, and drank water all without overt s/sx aspiration nor oral difficulty.  He stated he is completing approx  20 reps each day of effortful, Masako, and hocks. Pt very eager to have his PEG tube removed.   03/14/22: Needs to do EAT-10 next visit. Pt is eating more now that he is keeping a food journal. Eating filet mignon, salad, eggs, potatoes, sourdough rolls. Reports significant xerostomia and has started decaf coffee instead of caffeinated as recommended last session. Is completing HEP 20-25 reps/day for effortful and Masako, and 20 hocks/day. SLP reiterated at elast 30 reps for Masako and effortful are what was prescribed, and congratulated pt on 20 hocks/day. Today pt was independent with HEP.  With POs, pt ate peanut butter crackers and drank water. He req'd cue for taking a sip to begin meal. SLP provided rationale for this for pt to remind him why this was important. He req'd initial cue for double swallows for every bite/sip. By end of PO trials pt was independent.  02/28/22: Pt is using effortful swallow with meals. SLP told pt he could eliminate effortful swallow for HEP if this was occurring. Pt ate POs without cues necessary, no overt s/s of oral or pharyngeal difficulties using smaller bites and sips with peanut butter crackers. HEP was completed with SBA. If pt's progress cont to next session, every other week would be appropriate.  Pt is going to confer with Dillard Essex to see if there is a drinkable option for his tube feeds so he can be 100% PO, as he is now using 2-3 cartons of tube feed/day and is "maintaining, maybe gaining a little" with his current caloric regimen (PO breakfast and dinner and tube feed midday).  02/20/22 (eval): Educated pt on swallow precautions, rationale for each precaution, begin meals with drink, procedure for HEP, rationale for HEP, 8-10 weeks of HEP necessary to see functional progress with POs, after 10-12 weeks pt will have gained the swallow strength he will have in the future, necessary to cont to perform HEP x2-3/week after 10-12 weeks to maintain  strength   PATIENT EDUCATION: Education details: see "today's treatment"  Person educated: Patient Education method: Explanation, Demonstration, Verbal cues, and Handouts Education comprehension: verbalized understanding, returned demonstration, verbal cues required, and needs further education   ASSESSMENT:  CLINICAL  IMPRESSION: Patient is a 77 y.o. 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 SEE TX NOTE FOR DETAILS. Originally, pt performed HEP at suboptimal levels during and after chemoRT in late 2022-early 2023 and self-discharged from [facility] after cancelling a scheduled ST appointment with this SLP in May 2023. He hopes to have feeding tube removed after maintaining his weight for a specific amount of time. He agreed with d/c today.  OBJECTIVE IMPAIRMENTS: include dysphagia. These impairments are limiting patient from safety when swallowing. Factors affecting potential to achieve goals and functional outcome are cooperation/participation level and severity of impairments. Patient will benefit from skilled SLP services to address above impairments and improve overall function.  REHAB POTENTIAL: Fair due to time post chemoRT   GOALS: Goals reviewed with patient? Yes  SHORT TERM GOALS: Target date: 03/23/22  Pt perform HEP with modified independence in 2 sessions Baseline: Feb, [facility] Goal status: Partially met  2.  Pt follow recommended swallow precautions with POs  Baseline:  Goal status: met  3.  Pt tell SLP rationale for HEP in two sessions Baseline: 03/14/22 Goal status: met   LONG TERM GOALS: Target date: 04/25/22  Pt perform HEP with modified independence in 2 sessions after 03-23-22 Baseline: feb, march Goal status: partially met  Pt follow recommended swallow precautions with POs in 3 sessions Baseline:  Goal status: Paritally met  Pt score higher/better  with PROM than initial score Baseline:  Goal status: Did not complete  PLAN:  SLP FREQUENCY: 1x/week  SLP DURATION: 8 weeks  PLANNED INTERVENTIONS: Aspiration precaution training, Pharyngeal strengthening exercises, Diet toleration management , Trials of upgraded texture/liquids, Cueing hierachy, Internal/external aids, Oral motor exercises, SLP instruction and feedback, Compensatory strategies, and Patient/family education    [facility], [place] 04/17/2022, 12:28 AM

## 2022-04-19 ENCOUNTER — Other Ambulatory Visit: Payer: Self-pay

## 2022-04-19 DIAGNOSIS — C01 Malignant neoplasm of base of tongue: Secondary | ICD-10-CM

## 2022-04-23 ENCOUNTER — Inpatient Hospital Stay: Payer: Medicare Other | Attending: Radiation Oncology | Admitting: Nutrition

## 2022-04-23 NOTE — Progress Notes (Addendum)
Contacted patient by telephone for nutrition follow-up.  Patient was unavailable.  I left a message with name and phone number for return call.  Noted patient saw speech therapist yesterday and he was discharged from service.  Also noted feeding tube is scheduled to be removed on March 21.  I will not reschedule patient at this time but am available for follow-up as needed.  Addendum: Patient returned phone call. Reports wt is increased by 2 pounds and he is eating 2-3 full meals every day. He still supplements with Vanilla flavored Dillard Essex supplement if necessary. He feels comfortable with feeding tube removal.  Encouraged patient to contact RD if he develops questions or concerns.   **Disclaimer: This note was dictated with voice recognition software. Similar sounding words can inadvertently be transcribed and this note may contain transcription errors which may not have been corrected upon publication of note.**

## 2022-04-25 ENCOUNTER — Ambulatory Visit (HOSPITAL_COMMUNITY)
Admission: RE | Admit: 2022-04-25 | Discharge: 2022-04-25 | Disposition: A | Discharge status: Home / Self Care | Payer: Medicare Other | Source: Ambulatory Visit | Attending: Radiation Oncology | Admitting: Radiation Oncology

## 2022-04-25 DIAGNOSIS — C01 Malignant neoplasm of base of tongue: Secondary | ICD-10-CM | POA: Insufficient documentation

## 2022-04-25 DIAGNOSIS — Z431 Encounter for attention to gastrostomy: Secondary | ICD-10-CM | POA: Insufficient documentation

## 2022-04-25 HISTORY — PX: IR GASTROSTOMY TUBE REMOVAL: IMG5492

## 2022-04-25 MED ORDER — LIDOCAINE VISCOUS HCL 2 % MT SOLN
OROMUCOSAL | Status: AC
  Administered 2022-04-25: 10 mL
  Filled 2022-04-25: qty 15

## 2022-04-25 NOTE — Procedures (Signed)
20 french pull thru gastrostomy tube removed in its entirety without immediate complications. Gauze dressing applied to site. EBL< 2 cc. Medication used- viscous lidocaine to insertion site tract.

## 2022-06-07 NOTE — Progress Notes (Addendum)
Steven Mathews presents today for follow-up after completing radiation to his base of tongue on 03/13/2021.   Pain issues, if any: Continues to have mild throat pain. Dr. Pollyann Kennedy feels like this is from dry mouth (lack of saliva following treatment). He was started on a new medication for this.  Using a feeding tube?: no, had feeding tube removed about 2 months ago, he is doing well with eating. I did encourage him to look into boost or ensure for more nutrition.  Weight changes, if any: reports having trouble gaining weight even with eating well, ensure encouraged Swallowing issues, if any: Has trouble swallowing dry foods such as chicken, he still modifies what he eats.  Smoking or chewing tobacco? Not currently using  Using fluoride toothpaste daily? no Last ENT visit was on: Dr. Pollyann Kennedy on 03-15-22, no scope performed  Other notable issues, if any: Pt is able to work and working on house projects. He does report he tires out though. He feels like hearing has declined since cancer and radiation treatment. Reports taste is improving. Wants to know what cancer screening will look like for him in the future.

## 2022-06-20 ENCOUNTER — Telehealth: Payer: Self-pay

## 2022-06-20 ENCOUNTER — Encounter: Payer: Self-pay | Admitting: Radiation Oncology

## 2022-06-20 NOTE — Telephone Encounter (Signed)
Pt called for follow up without complication. Detailed note completed and routed to Dr. Basilio Cairo and Quitman Livings PA. Pt doing well overall with some weight gain concerns.

## 2022-06-21 ENCOUNTER — Ambulatory Visit
Admission: RE | Admit: 2022-06-21 | Discharge: 2022-06-21 | Disposition: A | Discharge status: Home / Self Care | Payer: Medicare Other | Source: Ambulatory Visit | Attending: Radiation Oncology | Admitting: Radiation Oncology

## 2022-06-21 ENCOUNTER — Other Ambulatory Visit: Payer: Self-pay

## 2022-06-21 ENCOUNTER — Encounter: Payer: Self-pay | Admitting: Radiation Oncology

## 2022-06-21 VITALS — BP 102/72 | HR 97 | Temp 97.7°F | Resp 20 | Wt 156.6 lb

## 2022-06-21 DIAGNOSIS — C01 Malignant neoplasm of base of tongue: Secondary | ICD-10-CM | POA: Diagnosis present

## 2022-06-21 DIAGNOSIS — E0590 Thyrotoxicosis, unspecified without thyrotoxic crisis or storm: Secondary | ICD-10-CM | POA: Insufficient documentation

## 2022-06-21 DIAGNOSIS — R5381 Other malaise: Secondary | ICD-10-CM

## 2022-06-21 DIAGNOSIS — Z923 Personal history of irradiation: Secondary | ICD-10-CM | POA: Insufficient documentation

## 2022-06-21 DIAGNOSIS — C770 Secondary and unspecified malignant neoplasm of lymph nodes of head, face and neck: Secondary | ICD-10-CM

## 2022-06-21 LAB — T4, FREE: Free T4: 0.84 ng/dL (ref 0.61–1.12)

## 2022-06-21 LAB — TSH: TSH: 5.714 u[IU]/mL — ABNORMAL HIGH (ref 0.350–4.500)

## 2022-06-21 NOTE — Progress Notes (Addendum)
Radiation Oncology         [phone] ________________________________  Name: Steven Mathews MRN: [id number]  Date: 06/21/2022  DOB: 1945/06/10  Follow-Up Visit Note  Outpatient  CC: Steven Carson, MD  Steven Carson, MD  Diagnosis and Prior Radiotherapy:    ICD-10-CM   1. Malignant neoplasm of base of tongue (HCC)  C01       Cancer Staging  Malignant neoplasm of base of tongue (HCC) Staging form: Pharynx - HPV-Mediated Oropharynx, AJCC 8th Edition - Clinical stage from 12/27/2020: Stage II (cT1, cN2, cM0, p16+) - Signed by Lonie Peak, MD on 12/27/2020 Stage prefix: Initial diagnosis   CHIEF COMPLAINT: Here for follow-up and surveillance of throat cancer  Narrative:   [name] presents today for follow-up after completing radiation to his base of tongue on 03/13/2021.   Pain issues, if any: Continues to have mild throat pain. Dr. Pollyann Kennedy feels like this is from dry mouth (lack of saliva following treatment). He was started on a new medication for this.  Using a feeding tube?: no, had feeding tube removed about 2 months ago, he is doing well with eating. We did encourage him to look into boost or ensure for more nutrition.  Weight changes, if any: reports having trouble gaining weight even with eating well, ensure encouraged Swallowing issues, if any: Has trouble swallowing dry foods such as chicken, he still modifies what he eats.  Smoking or chewing tobacco? Not currently using  Using fluoride toothpaste daily? no Last ENT visit was on: Dr. Pollyann Kennedy on 03-15-22, no scope performed  Other notable issues, if any: Pt is able to work and working on house projects. He does report he tires out though. He feels like hearing has declined since cancer and radiation treatment. Reports taste is improving. Wants to know what cancer screening will look like for him in the future.    ALLERGIES:  has No Known Allergies.  Meds: Current Outpatient Medications  Medication Sig Dispense Refill    aspirin 81 MG chewable tablet Chew 1 tablet (81 mg total) by mouth daily. 90 tablet 3   cevimeline (EVOXAC) 30 MG capsule Take 30 mg by mouth 3 (three) times daily.     rosuvastatin (CRESTOR) 40 MG tablet TAKE 1 TABLET(40 MG) BY MOUTH DAILY 90 tablet 2   fluticasone (FLONASE) 50 MCG/ACT nasal spray Place 1 spray into both nostrils daily. 16 g 3   guaiFENesin (MUCINEX) 600 MG 12 hr tablet Take 600 mg by mouth 2 (two) times daily as needed.     metoprolol succinate (TOPROL-XL) 50 MG 24 hr tablet Take 50 mg by mouth daily. Take with or immediately following a meal. (Patient not taking: Reported on 12/18/2021)     Nutritional Supplements (KATE FARMS STANDARD 1.4) LIQD 1 Bottle by Enteral route 5 (five) times daily. 1625 mL 6   pilocarpine (SALAGEN) 5 MG tablet Take 5 mg by mouth 3 (three) times daily.     sodium fluoride (PREVIDENT 5000 PLUS) 1.1 % CREA dental cream Place 1 pea-size drop into each tooth space of fluoride trays once a day at bedtime.  Leave trays in for 5 minutes and then remove.  Spit out excess fluoride, but DO NOT rinse with water, eat or drink for at least 30 minutes after use. (Patient not taking: Reported on 06/20/2022) 51 g 6   No current facility-administered medications for this encounter.    Physical Findings: The patient is in no acute distress. Patient is alert and oriented.  weight is 156 lb 9.6 oz (71 kg). His temperature is 97.7 F (36.5 C). His blood pressure is 102/72 and his pulse is 97. His respiration is 20 and oxygen saturation is 100%. .    General: Alert and oriented, in no acute distress HEENT: Head is normocephalic. Extraocular movements are intact. Oropharynx - no thrush or tumor.   Neck: Neck is supple, no palpable cervical or supraclavicular lymphadenopathy. Extremities: No cyanosis or edema. Lymphatics: see Neck Exam Skin: intact over neck, healed well Heart RRR Chest CTAB Psychiatric: Judgment and insight are intact. Affect is  appropriate.  PROCEDURE NOTE: After obtaining consent and spraying nasal cavity with topical oxymetazoline, the flexible endoscope was coated with lidocaine gel introduced and passed through the nasal cavity.  The nasopharynx, oropharynx, hypopharynx, and larynx  were then examined. No lesions appreciated in the mucosal axis.  The true cords were symmetrically mobile.  Tolerated the procedure well  Lab Findings: Lab Results  Component Value Date   WBC 5.7 02/06/2021   HGB 13.5 02/06/2021   HCT 39.4 02/06/2021   MCV 92.7 02/06/2021   PLT 179 02/06/2021    Radiographic Findings: No results found.  Impression/Plan:    1) Head and Neck Cancer Status: No evidence of disease  2) Nutritional Status: He no longer has his feeding tube but is struggling to maintain his weight and he will start drinking more nutritional shakes to supplement  3)  Swallowing: He will continue speech-language pathology exercises  4) Dental: Encouraged to continue regular followup with dentistry, and dental hygiene including fluoride rinses.   5) Thyroid function:   In November TSH slightly elevated, but Free T4 normal -we will recheck at next appointment Lab Results  Component Value Date   TSH 5.798 (H) 12/18/2021   7) Follow-up in 6 mo with me with restaging CT of neck and chest.The patient was encouraged to call with any issues or questions before then. We also discussed the importance of him staying up-to-date on colonoscopies for cancer surveillance.  On date of service, in total, I spent 30 minutes on this encounter. Patient was seen in person.    _____________________________________   Joyice Faster, PA   Lonie Peak, MD

## 2022-06-21 NOTE — Progress Notes (Signed)
Oncology Nurse Navigator Documentation   I met with Steven Mathews before and during his follow up appointment with Dr. Basilio Cairo today. He is doing well just looking to gain some weight. He plans to supplement his diet with nutritional shakes as needed. He knows to call me if he has any needs. He will see Dr. Basilio Cairo again in November to receive results of a post treatment CT scan of his neck and chest.   Hedda Slade RN, BSN, OCN Head & Neck Oncology Nurse Navigator [facility] Phone # [phone]  Fax # [phone]

## 2022-06-25 ENCOUNTER — Ambulatory Visit (INDEPENDENT_AMBULATORY_CARE_PROVIDER_SITE_OTHER): Payer: Medicare Other

## 2022-06-25 DIAGNOSIS — I471 Supraventricular tachycardia, unspecified: Secondary | ICD-10-CM

## 2022-06-25 LAB — CUP PACEART REMOTE DEVICE CHECK
Battery Remaining Longevity: 113 mo
Battery Voltage: 3 V
Brady Statistic AP VP Percent: 4.1 %
Brady Statistic AP VS Percent: 0.03 %
Brady Statistic AS VP Percent: 93.52 %
Brady Statistic AS VS Percent: 2.36 %
Brady Statistic RA Percent Paced: 4.09 %
Brady Statistic RV Percent Paced: 97.61 %
Date Time Interrogation Session: 20240521112226
Implantable Lead Connection Status: 753985
Implantable Lead Connection Status: 753985
Implantable Lead Implant Date: 20220523
Implantable Lead Implant Date: 20220523
Implantable Lead Location: 753859
Implantable Lead Location: 753860
Implantable Lead Model: 3830
Implantable Lead Model: 5076
Implantable Pulse Generator Implant Date: 20220523
Lead Channel Impedance Value: 266 Ohm
Lead Channel Impedance Value: 342 Ohm
Lead Channel Impedance Value: 475 Ohm
Lead Channel Impedance Value: 494 Ohm
Lead Channel Pacing Threshold Amplitude: 0.75 V
Lead Channel Pacing Threshold Amplitude: 1.25 V
Lead Channel Pacing Threshold Pulse Width: 0.4 ms
Lead Channel Pacing Threshold Pulse Width: 0.4 ms
Lead Channel Sensing Intrinsic Amplitude: 1.875 mV
Lead Channel Sensing Intrinsic Amplitude: 1.875 mV
Lead Channel Sensing Intrinsic Amplitude: 20 mV
Lead Channel Sensing Intrinsic Amplitude: 20 mV
Lead Channel Setting Pacing Amplitude: 1.5 V
Lead Channel Setting Pacing Amplitude: 2.5 V
Lead Channel Setting Pacing Pulse Width: 0.4 ms
Lead Channel Setting Sensing Sensitivity: 1.2 mV
Zone Setting Status: 755011
Zone Setting Status: 755011

## 2022-07-18 NOTE — Progress Notes (Signed)
Remote pacemaker transmission.   

## 2022-07-23 ENCOUNTER — Other Ambulatory Visit: Payer: Self-pay | Admitting: Cardiology

## 2022-09-13 ENCOUNTER — Telehealth: Payer: Self-pay | Admitting: Cardiology

## 2022-09-13 NOTE — Telephone Encounter (Signed)
Pt c/o medication issue:  1. Name of Medication:   rosuvastatin (CRESTOR) 40 MG tablet    2. How are you currently taking this medication (dosage and times per day)?  TAKE 1 TABLET(40 MG) BY MOUTH DAILY       3. Are you having a reaction (difficulty breathing--STAT)? No  4. What is your medication issue? Pt is requesting a callback regarding his medication and labs. Please advise

## 2022-09-13 NOTE — Telephone Encounter (Signed)
Patient needed appointment. Appointment scheduled

## 2022-09-24 ENCOUNTER — Ambulatory Visit: Payer: Self-pay

## 2022-09-24 DIAGNOSIS — I459 Conduction disorder, unspecified: Secondary | ICD-10-CM

## 2022-09-24 LAB — CUP PACEART REMOTE DEVICE CHECK
Battery Remaining Longevity: 110 mo
Battery Voltage: 2.99 V
Brady Statistic AP VP Percent: 2.32 %
Brady Statistic AP VS Percent: 0.01 %
Brady Statistic AS VP Percent: 95.95 %
Brady Statistic AS VS Percent: 1.72 %
Brady Statistic RA Percent Paced: 2.33 %
Brady Statistic RV Percent Paced: 98.27 %
Date Time Interrogation Session: 20240819190521
Implantable Lead Connection Status: 753985
Implantable Lead Connection Status: 753985
Implantable Lead Implant Date: 20220523
Implantable Lead Implant Date: 20220523
Implantable Lead Location: 753859
Implantable Lead Location: 753860
Implantable Lead Model: 3830
Implantable Lead Model: 5076
Implantable Pulse Generator Implant Date: 20220523
Lead Channel Impedance Value: 266 Ohm
Lead Channel Impedance Value: 361 Ohm
Lead Channel Impedance Value: 437 Ohm
Lead Channel Impedance Value: 475 Ohm
Lead Channel Pacing Threshold Amplitude: 0.75 V
Lead Channel Pacing Threshold Amplitude: 1.125 V
Lead Channel Pacing Threshold Pulse Width: 0.4 ms
Lead Channel Pacing Threshold Pulse Width: 0.4 ms
Lead Channel Sensing Intrinsic Amplitude: 1.625 mV
Lead Channel Sensing Intrinsic Amplitude: 1.625 mV
Lead Channel Sensing Intrinsic Amplitude: 16.125 mV
Lead Channel Sensing Intrinsic Amplitude: 16.125 mV
Lead Channel Setting Pacing Amplitude: 1.5 V
Lead Channel Setting Pacing Amplitude: 2.5 V
Lead Channel Setting Pacing Pulse Width: 0.4 ms
Lead Channel Setting Sensing Sensitivity: 1.2 mV
Zone Setting Status: 755011
Zone Setting Status: 755011

## 2022-10-04 NOTE — Progress Notes (Signed)
Remote pacemaker transmission.   

## 2022-10-19 NOTE — Progress Notes (Deleted)
Cardiology Clinic Note   Date: 10/19/2022 ID: Eufemio Stfleur, DOB [birth year], MRN [id number]  Primary Cardiologist:  Rollene Rotunda, MD  Patient Profile    Steven Mathews is a 77 y.o. male who presents to the clinic today for ***    Past medical history significant for: CAD. LHC 03/14/2006 (anterior MI) performed at [facility]: Multivessel disease with thrombus in LAD and distal apical thrombus.  Required balloon pump. 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 LHC 03/13/2018 (STEMI): Multivessel native coronary obstructive disease with 40 and 30% proximal LAD stenoses with competitive filling of the mid LAD via LIMA graft; small patent ramus intermediate vessel; 50 to 60% eccentric proximal left circumflex stenosis with smooth 70% ostial circumflex marginal stenosis with evidence for a very distal segment of the graft anastomosing into the mid OM vessel without competitive filling suggesting graft occlusion; 80% and 95% proximal to mid native RCA stenoses with diffuse 60% mid stenosis followed by total occlusion.  There is evidence for left to right collateralization to the PDA and distal vessels via the left coronary injection.  Patent LIMA supplying the mid LAD.  Probable old occlusion of vein graft to OM.  Probable acutely occluded vein graft to distal RCA with extensive thrombus.  Recommend aggressive medical therapy. Echo 03/15/2018: EF 60 to 65%.  Concentric LVH.  Normal diastolic parameters.  Moderately reduced RV function.  Mild MAC.  Mild aortic valve thickening/sclerosis without stenosis.  Mild dilatation of aortic root. PAF. Onset February 2008 postop CABG. Was treated with amiodarone. Heart block/symptomatic bradycardia. 3-day ZIO 05/03/2020: Predominant rhythm was normal sinus.  Tachycardia possibly sinus versus supraventricular noted.  Brief runs of V. tach with longest 4 beats.  Brief runs of AV block, complete but brief.  First-degree  block noted and Mobitz 1.  Triggered events during sinus with episodes of blocked PACs. 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 Remote device check 09/23/2022: Normal device function.  Leads and battery stable. SVT. SVT ablation 09/03/2012. Hypertension. Hyperlipidemia.     History of Present Illness    Steven Mathews is a longtime patient of cardiology.  He established cardiac care with Dr. Antoine Poche on 04/01/2006 following anterior MI treated with CABG x 3 at [facility].  He developed postop A-fib and was discharged on amiodarone.  Patient denied further palpitations/arrhythmias and amiodarone was discontinued on 05/29/2006.  Patient was lost to follow-up until 2010.  He returned to see Dr. Antoine Poche on 08/29/2008 with complaints of palpitations.  He reported stopping his beta-blocker and developing increased tachy palpitations with a severe episode while playing golf.  Cardiac event monitor showed SVT and beta-blocker was increased.  In June 2014 he was referred to Dr. Ladona Ridgel for SVT with rates in the 170s and subsequently underwent catheter ablation.  Several months later he had recurrence of tachypalpitations and did not want to undergo repeat ablation.  He was initially placed on flecainide with suppression of palpitations but was instructed to stop it secondary to history of CAD.  He was switched to amiodarone.  Unfortunately he developed an issue with his eye and ophthalmologist instructed him to stop amiodarone in June 2017.  He had recurrent symptoms and cardiac monitor demonstrated occasional AV WB NSVT 140 to 150 bpm.  He was started on metoprolol.  Patient suffered STEMI in February 2020 with LHC showing multivessel native coronary obstructive disease and 1/3 grafts patent.  Recommendation for aggressive medical management.  Patient was seen  by Dr. Antoine Poche March 2022 and it was felt patient was experiencing symptomatic bradycardic arrhythmias in the setting of high degree conduction block.  3-day ZIO  showed brief runs of complete AV block, first-degree block and Mobitz 1.  He was referred back to Dr. Ladona Ridgel and underwent PPM placement May 2022.  Last seen in the office by Dr. Antoine Poche on 01/19/2021 for routine follow-up.  He was doing well at that time and no changes were made.  Today, patient ***  CAD.  S/p CABG x 09 March 2006.  LHC February 2020 in the setting of STEMI showed multivessel native coronary obstructive disease and 1/3 grafts patent. Patient *** Continue aspirin, Toprol, rosuvastatin. Heart block/symptomatic bradycardia.  S/p PPM implantation May 2022.  Remote device check August 2024 showed normal device function with stable leads and battery.  Patient*** Continue to follow with EP. SVT.  S/p SVT ablation July 2014.  Patient*** Continue Toprol. Hypertension. BP today *** Patient denies headaches, dizziness or vision changes. Continue Toprol. Hyperlipidemia.***   ROS: All other systems reviewed and are otherwise negative except as noted in History of Present Illness.  Studies Reviewed       ***  Risk Assessment/Calculations    {Does this patient have ATRIAL FIBRILLATION?[phone]} No BP recorded.  {Refresh Note OR Click here to enter BP  :1}***        Physical Exam    VS:  There were no vitals taken for this visit. , BMI There is no height or weight on file to calculate BMI.  GEN: Well nourished, well developed, in no acute distress. Neck: No JVD or carotid bruits. Cardiac: *** RRR. No murmurs. No rubs or gallops.   Respiratory:  Respirations regular and unlabored. Clear to auscultation without rales, wheezing or rhonchi. GI: Soft, nontender, nondistended. Extremities: Radials/DP/PT 2+ and equal bilaterally. No clubbing or cyanosis. No edema ***  Skin: Warm and dry, no rash. Neuro: Strength intact.  Assessment & Plan   ***  Disposition: ***     {Are you ordering a CV Procedure (e.g. stress test, cath, DCCV, TEE, etc)?   Press F2        :[id number]}    Signed, Etta Grandchild. Keylin Ferryman, DNP, NP-C

## 2022-10-21 ENCOUNTER — Ambulatory Visit: Payer: Medicare Other | Attending: Student | Admitting: Student

## 2022-10-29 ENCOUNTER — Telehealth: Payer: Self-pay | Admitting: Cardiology

## 2022-10-29 ENCOUNTER — Encounter: Payer: Self-pay | Admitting: Nurse Practitioner

## 2022-10-29 ENCOUNTER — Ambulatory Visit: Payer: Medicare Other | Attending: Nurse Practitioner | Admitting: Nurse Practitioner

## 2022-10-29 VITALS — BP 118/68 | HR 82 | Ht 72.0 in | Wt 161.0 lb

## 2022-10-29 DIAGNOSIS — E785 Hyperlipidemia, unspecified: Secondary | ICD-10-CM | POA: Diagnosis present

## 2022-10-29 DIAGNOSIS — I10 Essential (primary) hypertension: Secondary | ICD-10-CM | POA: Diagnosis present

## 2022-10-29 DIAGNOSIS — I2510 Atherosclerotic heart disease of native coronary artery without angina pectoris: Secondary | ICD-10-CM | POA: Insufficient documentation

## 2022-10-29 DIAGNOSIS — I471 Supraventricular tachycardia, unspecified: Secondary | ICD-10-CM | POA: Diagnosis not present

## 2022-10-29 DIAGNOSIS — R001 Bradycardia, unspecified: Secondary | ICD-10-CM | POA: Diagnosis present

## 2022-10-29 MED ORDER — NITROGLYCERIN 0.4 MG SL SUBL: 0.4000 mg | SUBLINGUAL_TABLET | SUBLINGUAL | 3 refills | Status: AC | PRN

## 2022-10-29 MED ORDER — METOPROLOL SUCCINATE ER 25 MG PO TB24: 25.0000 mg | ORAL_TABLET | Freq: Every day | ORAL | 3 refills | Status: DC

## 2022-10-29 MED ORDER — ROSUVASTATIN CALCIUM 40 MG PO TABS: 40.0000 mg | ORAL_TABLET | Freq: Every day | ORAL | 3 refills | Status: DC

## 2022-10-29 NOTE — Telephone Encounter (Signed)
  Per MyChart Scheduling message:    Pt c/o of Chest Pain: STAT if active CP, including tightness, pressure, jaw pain, radiating pain to shoulder/upper arm/back, CP unrelieved by Nitro. Symptoms reported of SOB, nausea, vomiting, sweating.  1. Are you having CP right now?    2. Are you experiencing any other symptoms (ex. SOB, nausea, vomiting, sweating)?   3. Is your CP continuous or coming and going?   4. Have you taken Nitroglycerin?   5. How long have you been experiencing CP?    6. If NO CP at time of call then end call with telling Pt to call back or call 911 if Chest pain returns prior to return call from triage team.   Not experiencing significant chest pains and tightness at this time.  Been experiencing "significant tingling " in left arm over past week. concerned it is a symptom of a potential problem.

## 2022-10-29 NOTE — Telephone Encounter (Signed)
Pt confirmed he is still having intermittent tingling in arm. No report of CP or other symptoms. Appt scheduled with NP for today.

## 2022-10-29 NOTE — Patient Instructions (Addendum)
Medication Instructions:  Restart Metoprolol Succinate 25 MG once a day Restart Crestor 40 MG once a day Start nitroglycerin 0.4 mg as needed for chest pain lasting longer than 5 minutes *If you need a refill on your cardiac medications before your next appointment, please call your pharmacy*   Lab Work: No Labs   Testing/Procedures: No Testing    Follow-Up: At [facility], you and your health needs are our priority.  As part of our continuing mission to provide you with exceptional heart care, we have created designated Provider Care Teams.  These Care Teams include your primary Cardiologist (physician) and Advanced Practice Providers (APPs -  Physician Assistants and Nurse Practitioners) who all work together to provide you with the care you need, when you need it.  We recommend signing up for the patient portal called "MyChart".  Sign up information is provided on this After Visit Summary.  MyChart is used to connect with patients for Virtual Visits (Telemedicine).  Patients are able to view lab/test results, encounter notes, upcoming appointments, etc.  Non-urgent messages can be sent to your provider as well.   To learn more about what you can do with MyChart, go to [email or web address].    Your next appointment:   6-8 week(s) Preferably AM Appointment  Provider:   [name], NP

## 2022-10-29 NOTE — Progress Notes (Signed)
Office Visit    Patient Name: Steven Mathews Date of Encounter: 10/29/2022  Primary Care Provider:  Herma Carson, MD Primary Cardiologist:  Rollene Rotunda, MD  Chief Complaint   77 year old male with a history of CAD s/p CABG x 3 (LIMA-LAD, SVG-PDA, SVG-OM) in 2008, bradycardia s/p PPM, SVT, hypertension, hyperlipidemia, tongue cancer, and GERD who presents for follow-up related to CAD.  Past Medical History    Past Medical History:  Diagnosis Date   Atrial fibrillation (HCC)    CAD (coronary artery disease)    CABG 2008; 03/13/2018 NSTEMI due to acute thrombotic occlusion of RCA graft, patent LIMA to LAD.   Chronic cough    GERD (gastroesophageal reflux disease)    Hx of adenomatous colonic polyps 04/04/2016   Hyperlipidemia    Hypertension    Myocardial infarct (HCC)    Presence of permanent cardiac pacemaker    SVT (supraventricular tachycardia)    Past Surgical History:  Procedure Laterality Date   CORONARY ARTERY BYPASS GRAFT  03/2006   DIRECT LARYNGOSCOPY N/A 12/11/2020   Procedure: DIRECT LARYNGOSCOPY WITH BIOPSY;  Surgeon: Serena Colonel, MD;  Location: [facility] OR;  Service: ENT;  Laterality: N/A;   ENDOBRONCHIAL ULTRASOUND N/A 01/08/2021   Procedure: ENDOBRONCHIAL ULTRASOUND;  Surgeon: Omar Person, MD;  Location: [facility] ENDOSCOPY;  Service: Pulmonary;  Laterality: N/A;   ESOPHAGOSCOPY N/A 12/11/2020   Procedure: ESOPHAGOSCOPY;  Surgeon: Serena Colonel, MD;  Location: [facility] OR;  Service: ENT;  Laterality: N/A;   FINE NEEDLE ASPIRATION  01/08/2021   Procedure: FINE NEEDLE ASPIRATION;  Surgeon: Omar Person, MD;  Location: [facility] ENDOSCOPY;  Service: Pulmonary;;   HERNIA REPAIR  1964   IR CM INJ ANY COLONIC TUBE W/FLUORO  12/20/2021   IR GASTROSTOMY TUBE MOD SED  02/06/2021   IR GASTROSTOMY TUBE REMOVAL  04/25/2022   LEFT HEART CATH AND CORS/GRAFTS ANGIOGRAPHY N/A 03/13/2018   Procedure: LEFT HEART CATH AND CORS/GRAFTS ANGIOGRAPHY;  Surgeon: Lennette Bihari, MD;  Location: [facility]  INVASIVE CV LAB;  Service: Cardiovascular;  Laterality: N/A;   PACEMAKER IMPLANT N/A 06/26/2020   Procedure: PACEMAKER IMPLANT;  Surgeon: Marinus Maw, MD;  Location: [facility] INVASIVE CV LAB;  Service: Cardiovascular;  Laterality: N/A;   RADICAL NECK DISSECTION Right 12/11/2020   Procedure: RIGHT MODIFIED NECK DISSECTION;  Surgeon: Serena Colonel, MD;  Location: [facility] OR;  Service: ENT;  Laterality: Right;   SUPRAVENTRICULAR TACHYCARDIA ABLATION N/A 09/03/2012   Procedure: SUPRAVENTRICULAR TACHYCARDIA ABLATION;  Surgeon: Marinus Maw, MD;  Location: [facility] CATH LAB;  Service: Cardiovascular;  Laterality: N/A;   TONSILLECTOMY Bilateral 12/11/2020   Procedure: BILATERAL TONSILLECTOMY;  Surgeon: Serena Colonel, MD;  Location: [facility] OR;  Service: ENT;  Laterality: Bilateral;   VIDEO BRONCHOSCOPY  01/08/2021   Procedure: VIDEO BRONCHOSCOPY WITHOUT FLUORO;  Surgeon: Omar Person, MD;  Location: [facility] ENDOSCOPY;  Service: Pulmonary;;    Allergies  No Known Allergies   Labs/Other Studies Reviewed    The following studies were reviewed today:  Cardiac Studies & Procedures   CARDIAC CATHETERIZATION  CARDIAC CATHETERIZATION 03/13/2018  Narrative  Ost LAD to Prox LAD lesion is 40% stenosed.  Prox LAD lesion is 30% stenosed.  Prox Cx lesion is 55% stenosed.  Origin to Prox Graft lesion is 100% stenosed.  Prox RCA lesion is 80% stenosed.  Prox RCA to Mid RCA lesion is 95% stenosed.  Mid RCA to Dist RCA lesion is 100% stenosed.  Prox Graft to Dist Graft lesion is 100% stenosed.  LV  end diastolic pressure is mildly elevated.  There is mild left ventricular systolic dysfunction.  Ost 1st Mrg lesion is 70% stenosed.  Multivessel native coronary obstructive disease with 40 and 30% proximal LAD stenoses with competitive filling of the mid LAD via LIMA graft; small patent ramus intermediate vessel; 50 to 60% eccentric proximal left circumflex stenosis with smooth 70% ostial circumflex marginal stenosis  with evidence for a very distal segment of the graft anastomosing into the mid OM vessel without competitive filling suggesting graft occlusion; 80% and 95% proximal to mid native RCA stenoses with diffuse 60% mid stenosis followed by total occlusion.  There is evidence for left to right collateralization to the PDA and distal vessels via the left coronary injection.  Patent LIMA graft supplying the mid LAD.  Probable old occlusion of the vein graft which had supplied the obtuse marginal vessel.  Probable acutely occluded vein graft supplying the distal RCA with extensive thrombus throughout and TIMI 0 flow beyond the proximal third of the graft.  Mild LV dysfunction with an ejection fraction of 40 to 45% and evidence for focal basal inferior hypocontractility and focal apical to apical inferior hypocontractility.  LVEDP 19 mmHg.  RECOMMENDATION: Medical therapy.  The patient's mild ST elevation inferiorly most likely is due to extensive thrombus in the occluded vein graft supplying the distal RCA.  The patient had been off aspirin for over a week.  Will initiate Plavix 300 mg which was given in the lab and 75 mg daily for dual antiplatelet therapy.  Recommend aggressive medical therapy for concomitant CAD.  Aggressive lipid-lowering therapy with target LDL less than 70.  Will obtain 2D echo Doppler study.  We will try to to obtain more specific records of his CABG revascularization.  Findings Coronary Findings Diagnostic  Dominance: Right  Left Anterior Descending Ost LAD to Prox LAD lesion is 40% stenosed. Prox LAD lesion is 30% stenosed.  Left Circumflex Prox Cx lesion is 55% stenosed.  First Obtuse Marginal Branch Ost 1st Mrg lesion is 70% stenosed.  Right Coronary Artery Prox RCA lesion is 80% stenosed. Prox RCA to Mid RCA lesion is 95% stenosed. Mid RCA to Dist RCA lesion is 100% stenosed.  Acute Marginal Branch Vessel is small in size.  Right Posterior Descending  Artery Collaterals RPDA filled by collaterals from 1st Sept.  Right Posterior Atrioventricular Artery Vessel is small in size.  LIMA Graft To Mid LAD  Graft To 1st Mrg Origin to Prox Graft lesion is 100% stenosed.  Graft To RPDA Prox Graft to Dist Graft lesion is 100% stenosed.  Intervention  No interventions have been documented.     ECHOCARDIOGRAM  ECHOCARDIOGRAM COMPLETE 03/15/2018  Narrative ECHOCARDIOGRAM REPORT    Patient Name:   Steven Mathews Date of Exam: 03/15/2018 Medical Rec #:  [id number]     Height:       72.0 in Accession #:    [id number]    Weight:       192.7 lb Date of Birth:  December 19, 1945      BSA:          2.10 m Patient Age:    72 years      BP:           137/77 mmHg Patient Gender: M             HR:           73 bpm. Exam Location:  Inpatient   Procedure: 2D Echo  Indications:  Acute myocardial infarction 410  History:        Patient has prior history of Echocardiogram examinations, most recent 10/17/2015. Previous Myocardial Infarction, Prior CABG; Risk Factors: Dyslipidemia.  Sonographer:    Ross Ludwig RDCS (AE) Referring Phys: [id number] [facility]AG[facility]  IMPRESSIONS   1. The left ventricle has normal systolic function of 60-65%. The cavity size was normal. There is concentric left ventricular hypertrophy. Echo evidence of normal diastolic relaxation. 2. The right ventricle has moderately reduced systolic function. The cavity was mildly enlarged. There is no increase in right ventricular wall thickness. 3. The mitral valve is normal in structure. There is mild thickening. There is mild mitral annular calcification present. 4. The tricuspid valve is normal in structure. 5. The aortic valve is tricuspid There is mild thickening and sclerosis without any evidence of stenosis of the aortic valve. 6. There is mild dilatation of the aortic root. 7. The inferior vena cava was dilated in size with >50% respiratory variability. 8. No evidence of left  ventricular regional wall motion abnormalities.  FINDINGS Left Ventricle: The left ventricle has normal systolic function of 60-65%. The cavity size was normal. There is concentric left ventricular hypertrophy. Echo evidence of normal diastolic relaxation No evidence of left ventricular regional wall motion abnormalities.. Right Ventricle: The right ventricle has moderately reduced systolic function. The cavity was mildly enlarged. There is no increase in right ventricular wall thickness. Left Atrium: left atrial size was normal in size Right Atrium: right atrial size was normal in size Interatrial Septum: No atrial level shunt detected by color flow Doppler.  Pericardium: There is no evidence of pericardial effusion. Mitral Valve: The mitral valve is normal in structure. There is mild thickening. There is mild mitral annular calcification present. Mitral valve regurgitation is mild by color flow Doppler. Tricuspid Valve: The tricuspid valve is normal in structure. Tricuspid valve regurgitation is mild by color flow Doppler. Aortic Valve: The aortic valve is tricuspid There is mild thickening and sclerosis without any evidence of stenosis of the aortic valve. Aortic valve regurgitation was not visualized by color flow Doppler. Pulmonic Valve: The pulmonic valve was grossly normal. Pulmonic valve regurgitation is not visualized by color flow Doppler. Aorta: There is mild dilatation of the aortic root. Venous: The inferior vena cava is dilated in size with greater than 50% respiratory variability.  LEFT VENTRICLE PLAX 2D (Teich) LV EF:          61.7 %   Diastology LVIDd:          5.10 cm  LV e' lateral:   13.90 cm/s LVIDs:          3.40 cm  LV E/e' lateral: 4.1 LV PW:          1.10 cm  LV e' medial:    7.51 cm/s LV IVS:         1.00 cm  LV E/e' medial:  7.6 LVOT diam:      2.30 cm LV SV:          76 ml LVOT Area:      4.15 cm  RIGHT VENTRICLE RV S prime:     6.85 cm/s TAPSE (M-mode):  1.3 cm RVSP:           27.4 mmHg  LEFT ATRIUM             Index       RIGHT ATRIUM           Index LA diam:  4.70 cm 2.24 cm/m  RA Pressure: 8 mmHg LA Vol (A2C):   62.5 ml 29.80 ml/m RA Area:     19.70 cm LA Vol (A4C):   44.8 ml 21.36 ml/m RA Volume:   47.90 ml  22.84 ml/m LA Biplane Vol: 54.1 ml 25.80 ml/m AORTIC VALVE LVOT Vmax:   99.30 cm/s LVOT Vmean:  61.700 cm/s LVOT VTI:    0.183 m  AORTA Ao Root diam: 4.10 cm  MITRAL VALVE              TR Peak grad: 19.4 mmHg MV Area (PHT): 2.71 cm   TR Vmax:      228.00 cm/s MV PHT:        81.20 msec RVSP:         27.4 mmHg MV Decel Time: 280 msec MV E velocity: 57.20 cm/s MV A velocity: 46.20 cm/s MV E/A ratio:  1.24   Prentice Docker MD Electronically signed by Prentice Docker MD Signature Date/Time: 03/15/2018/3:10:04 PM    Final    MONITORS  LONG TERM MONITOR (3-14 DAYS) 05/03/2020  Narrative  Patch Wear Time: 3 days and 9 hours (2022-03-16T10:22:21-0400 to 2022-03-19T20:17:13-0400) Patient had a min HR of 30 bpm, max HR of 197 bpm, and avg HR of 88 bpm. Predominant underlying rhythm was Sinus Rhythm. First Degree AV Block was present. Bundle Branch Block/IVCD was present. Slight P wave morphology changes were noted. 1 run of Ventricular Tachycardia occurred lasting 4 beats with a max rate of 197 bpm (avg 174 bpm). 2 episode(s) of AV Block (High Grade AV Block) occurred, lasting a total of 5 secs. Second Degree AV Block-Mobitz I (Wenckebach) was present. Wenckebach was detected within +/- 45 seconds of symptomatic patient event(s). Isolated SVEs were occasional (1.2%, 5174), SVE Couplets were rare (<1.0%, 500), and SVE Triplets were rare (<1.0%, 144). Isolated VEs were rare (<1.0%), VE Couplets were rare (<1.0%), and no VE Triplets were present.  Predominant rhythm was Normal sinus Tachycardia possibly sinus vs supraventricular noted Brief runs of ventricular tachycardia with the longest being 4 beats ventricular  tach Brief runs of AV block complete but brief. First degree block noted and Mobitz 1 Patient trigger events happened during sinus with episodes of blocked PACs.          Recent Labs: 12/18/2021: ALT 10; BUN 21; Creatinine 0.95; Magnesium 2.1; Potassium 4.3; Sodium 134 06/21/2022: TSH 5.714  Recent Lipid Panel    Component Value Date/Time   CHOL 158 04/25/2020 0857   TRIG 142 04/25/2020 0857   HDL 47 04/25/2020 0857   CHOLHDL 3.4 04/25/2020 0857   CHOLHDL 4.2 03/13/2018 2055   VLDL 14 03/13/2018 2055   LDLCALC 86 04/25/2020 0857   LDLDIRECT 114.1 06/16/2012 1032    History of Present Illness    77 year old male with the above past medical history including CAD s/p CABG x 3 (LIMA-LAD, SVG-PDA, SVG-OM) in 2008, bradycardia s/p PPM, SVT, hypertension, hyperlipidemia, tongue cancer, and GERD.  He has a history of remote CABG in 2008.  He was hospitalized in February 2020 in the setting of inferior STEMI.  Cardiac catheterization revealed 40% ostial to proximal LAD lesion, 30% proximal LAD lesion, 50% proximal left circumflex lesion, 1 9% occlusion of SVG to distal RCA with extensive thrombus, patent LIMA-LAD, chronically occluded SVG-OM, EF 40 to 45%.  Medical therapy was recommended.  Echocardiogram at the time showed EF 60 to 65%, moderately reduced RV EF, mild dilation of the aortic root.  He was noted  to have Mobitz 1 second-degree heart block, beta-blocker was held and later reintroduced.  He subsequent developed symptomatic bradycardia with Mobitz type II and underwent dual-chamber pacemaker placement.  He was last seen in the office on 01/19/2021 and was stable from a cardiac standpoint.  He denied symptoms concerning for angina.  He contacted our office on 10/29/2022 and noted a 1 week history of tingling in his left arm.  He presents today for follow-up.  Since his last visit he has been stable from a cardiac standpoint.  He denies any symptoms similar to prior anginal equivalent,  however, he does note a several week history of intermittent left arm tingling.  Not similar to prior anginal equivalent.  He denies any chest pain, dyspnea. He does note balance issues since going through radiation therapy as well as occasional dizziness.  He denies any palpitations, presyncope or syncope.  He has not been checking his blood pressure regularly.  He has stopped taking his metoprolol and Crestor (his prescriptions ran out).    Home Medications    Current Outpatient Medications  Medication Sig Dispense Refill   aspirin 81 MG chewable tablet Chew 1 tablet (81 mg total) by mouth daily. 90 tablet 3   fluticasone (FLONASE) 50 MCG/ACT nasal spray Place 1 spray into both nostrils daily. 16 g 3   metoprolol succinate (TOPROL XL) 25 MG 24 hr tablet Take 1 tablet (25 mg total) by mouth daily. 90 tablet 3   nitroGLYCERIN (NITROSTAT) 0.4 MG SL tablet Place 1 tablet (0.4 mg total) under the tongue every 5 (five) minutes as needed for chest pain. 90 tablet 3   sodium fluoride (PREVIDENT 5000 PLUS) 1.1 % CREA dental cream Place 1 pea-size drop into each tooth space of fluoride trays once a day at bedtime.  Leave trays in for 5 minutes and then remove.  Spit out excess fluoride, but DO NOT rinse with water, eat or drink for at least 30 minutes after use. 51 g 6   rosuvastatin (CRESTOR) 40 MG tablet Take 1 tablet (40 mg total) by mouth daily. 90 tablet 3   No current facility-administered medications for this visit.     Review of Systems    He denies chest pain, palpitations, dyspnea, pnd, orthopnea, n, v, syncope, edema, weight gain, or early satiety. All other systems reviewed and are otherwise negative except as noted above.   Physical Exam    VS:  BP 118/68   Pulse 82   Ht 6' (1.829 m)   Wt 161 lb (73 kg)   SpO2 98%   BMI 21.84 kg/m   GEN: Well nourished, well developed, in no acute distress. HEENT: normal. Neck: Supple, no JVD, carotid bruits, or masses. Cardiac: RRR, no murmurs,  rubs, or gallops. No clubbing, cyanosis, edema.  Radials/DP/PT 2+ and equal bilaterally.  Respiratory:  Respirations regular and unlabored, clear to auscultation bilaterally. GI: Soft, nontender, nondistended, BS + x 4. MS: no deformity or atrophy. Skin: warm and dry, no rash. Neuro:  Strength and sensation are intact. Psych: Normal affect.  Accessory Clinical Findings    ECG personally reviewed by me today - EKG Interpretation Date/Time:  Tuesday October 29 2022 14:29:33 EDT Ventricular Rate:  82 PR Interval:  182 QRS Duration:  136 QT Interval:  378 QTC Calculation: 441 R Axis:   -5  Text Interpretation: Atrial-sensed ventricular-paced rhythm with Fusion complexes When compared with ECG of 26-Jun-2020 15:25, Fusion complexes are now Present Vent. rate has increased BY  11  BPM Confirmed by [name] ([id number]) on 10/29/2022 2:42:52 PM  - no acute changes.   Lab Results  Component Value Date   WBC 5.7 02/06/2021   HGB 13.5 02/06/2021   HCT 39.4 02/06/2021   MCV 92.7 02/06/2021   PLT 179 02/06/2021   Lab Results  Component Value Date   CREATININE 0.95 12/18/2021   BUN 21 12/18/2021   NA 134 (L) 12/18/2021   K 4.3 12/18/2021   CL 100 12/18/2021   CO2 28 12/18/2021   Lab Results  Component Value Date   ALT 10 12/18/2021   AST 15 12/18/2021   ALKPHOS 46 12/18/2021   BILITOT 0.3 12/18/2021   Lab Results  Component Value Date   CHOL 158 04/25/2020   HDL 47 04/25/2020   LDLCALC 86 04/25/2020   LDLDIRECT 114.1 06/16/2012   TRIG 142 04/25/2020   CHOLHDL 3.4 04/25/2020    No results found for: "HGBA1C"  Assessment & Plan    1. CAD:  S/p CABG x 3 (LIMA-LAD, SVG-PDA, SVG-OM) in 2008.  Cardiac catheterization revealed 40% ostial to proximal LAD lesion, 30% proximal LAD lesion, 50% proximal left circumflex lesion, 1 9% occlusion of SVG to distal RCA with extensive thrombus, patent LIMA-LAD, chronically occluded SVG-OM, EF 40 to 45%.  Medical therapy was recommended.   Echocardiogram at the time showed EF 60 to 65%, moderately reduced RV EF, mild dilation of the aortic root.  He notes a several week history of left arm tingling, not similar to prior anginal equivalent.  He denies any chest pain, dyspnea. We discussed possible ischemic evaluation with cardiac PET stress test, he would prefer to continue to monitor his symptoms before proceeding. We discussed possible antianginal therapy, he declines at this time. Provided prescription for as needed nitroglycerin. Reviewed ED precautions.  Will resume aspirin, metoprolol (will lower dose to 25 mg daily given BP), and Crestor.  2. Bradcardia/SVT: S/p PPM.  Most recent device interrogation showed normal device function, 1 VHR detection lasting 6 beats.  He notes occasional dizziness, he denies any palpitations, presyncope or syncope.  Resume metoprolol as above.  3. Hypertension: BP well controlled. Continue current antihypertensive regimen.   4. Hyperlipidemia: No recent LDL on file.  Will resume Crestor as above.  Will plan for fasting lipid panel, LFTs at next follow-up visit.  5. Disposition: Follow-up in 6-8 weeks.       Joylene Grapes, NP 10/29/2022, 5:57 PM

## 2022-12-08 IMAGING — PT NM PET TUM IMG INITIAL (PI) WHOLE BODY
2 series · 8 of 8 positions shown · non-contrast
Comparison: Report from previous MRI of the head and neck.

CLINICAL DATA: Initial treatment strategy for staging of suspected
head neck cancer to cervical lymph nodes.

EXAM:
NUCLEAR MEDICINE PET WHOLE BODY
TECHNIQUE: 9.9 mCi F-18 FDG was injected intravenously. Full-ring PET imaging
was performed from the head to foot after the radiotracer. CT data
was obtained and used for attenuation correction and anatomic
localization.
Fasting blood glucose: 107 mg/dl

[Series 1097: results mm oncology reading · 1.2mm · 0.55mm/px · 5 of 5 slices shown (1 of 2)]
[im 1/5]
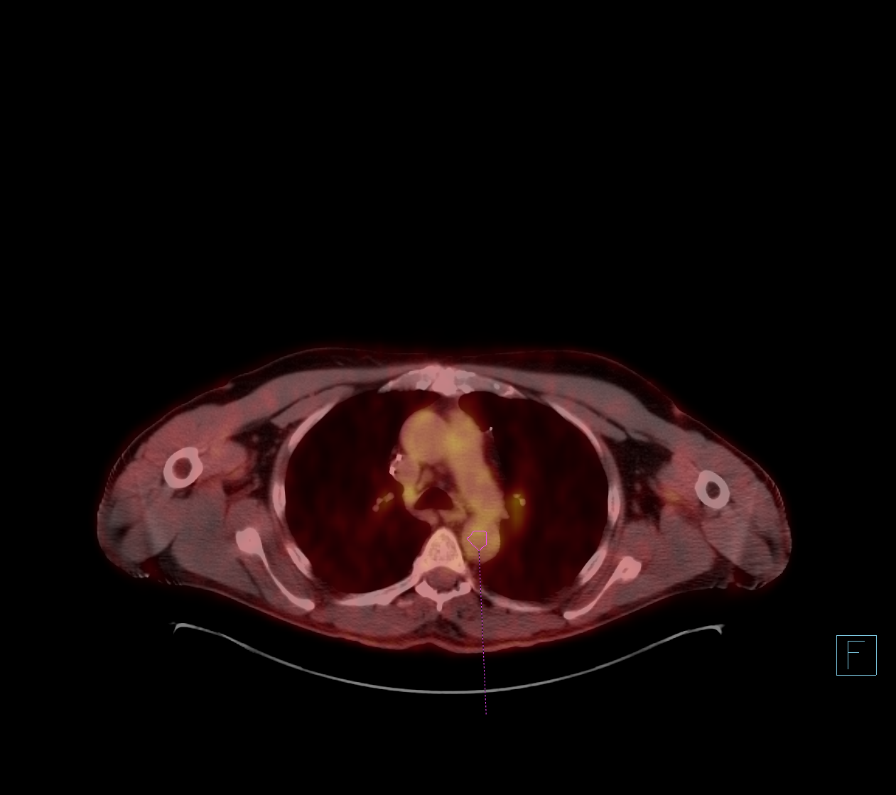
[im 2/5]
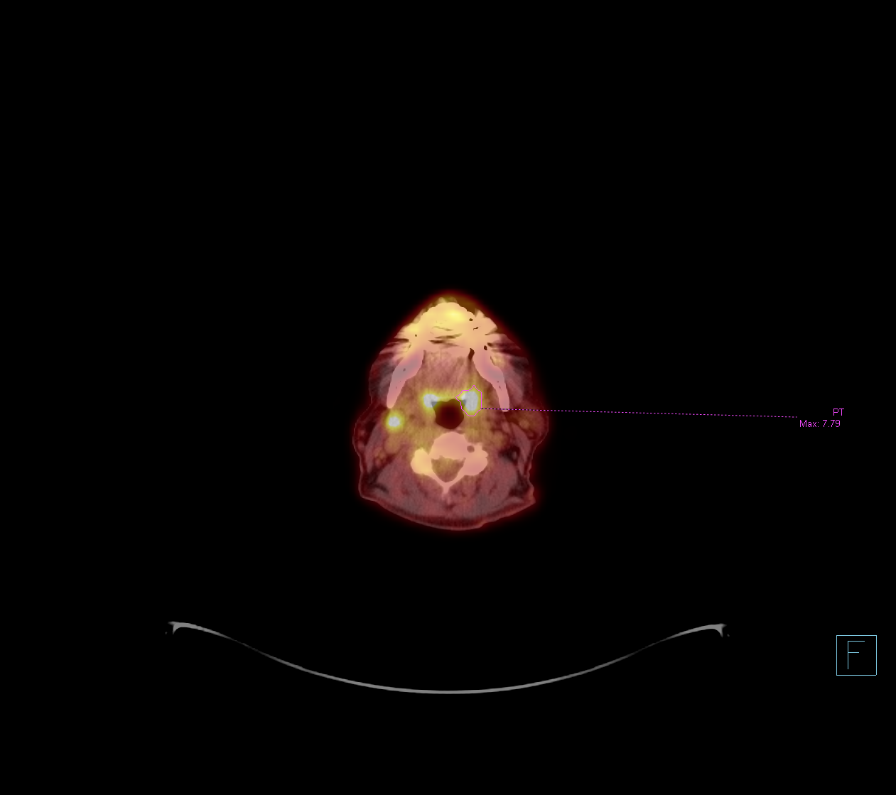
[im 3/5]
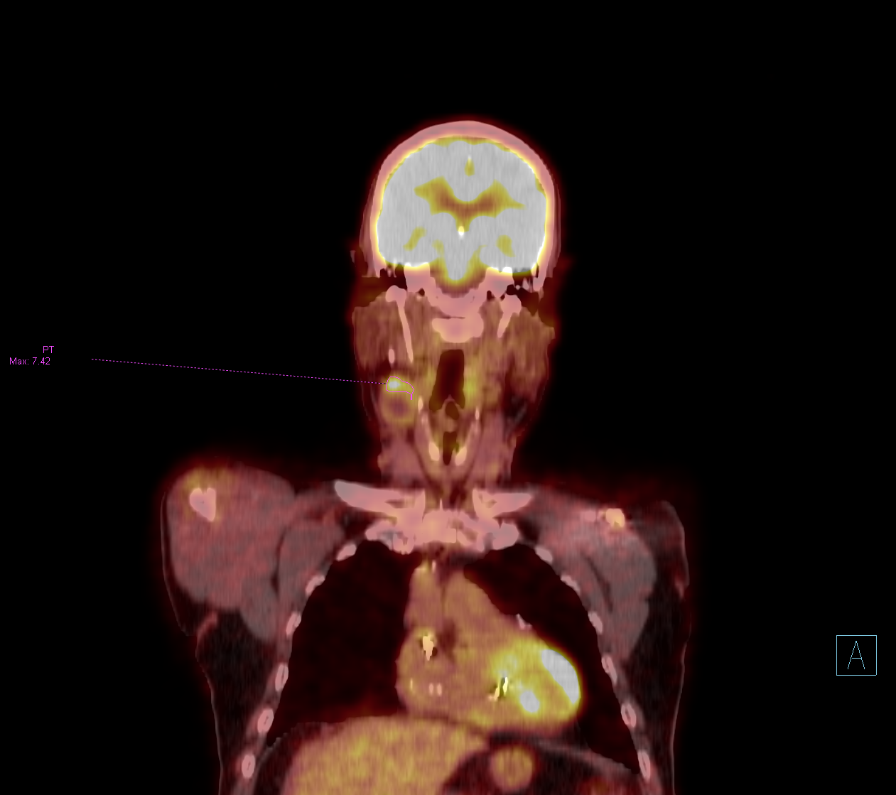
[im 4/5]
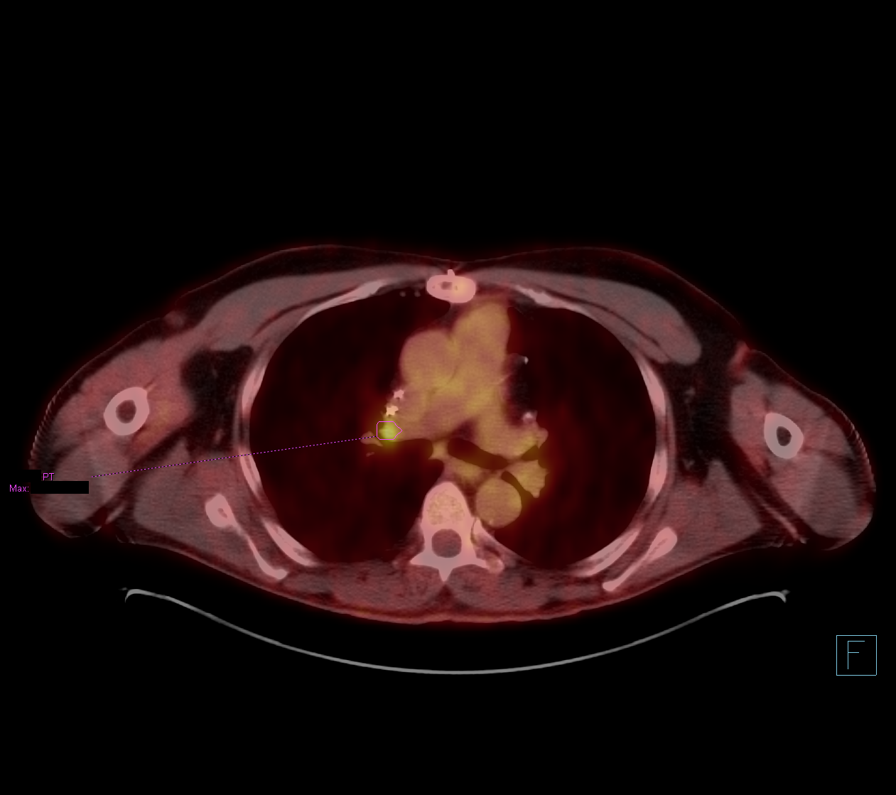
[im 5/5]
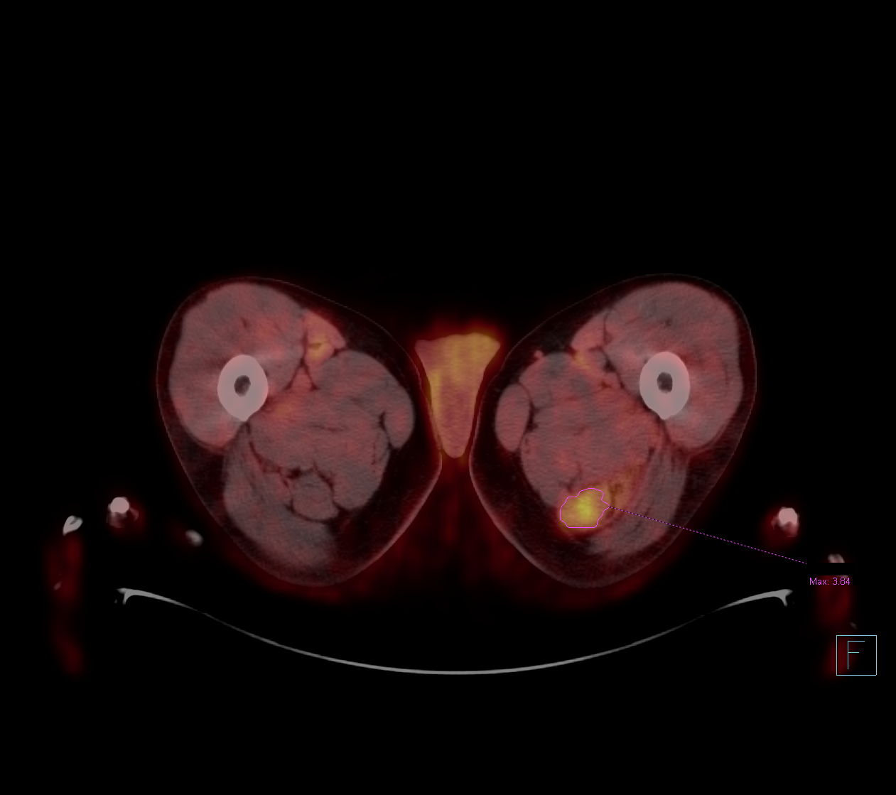

[Series 1207: results mm oncology reading · 4.0mm · 1.19mm/px · 3 of 3 slices shown (2 of 2)]
[im 1/3]
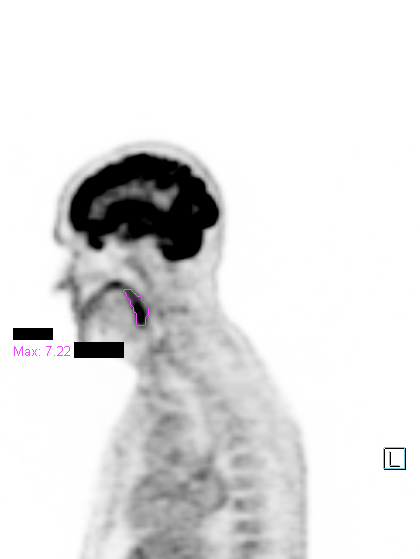
[im 2/3]
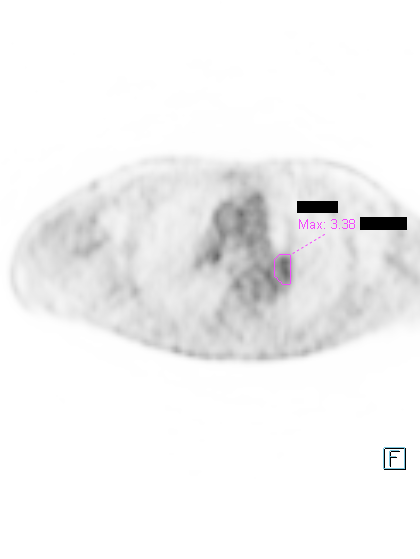
[im 3/3]
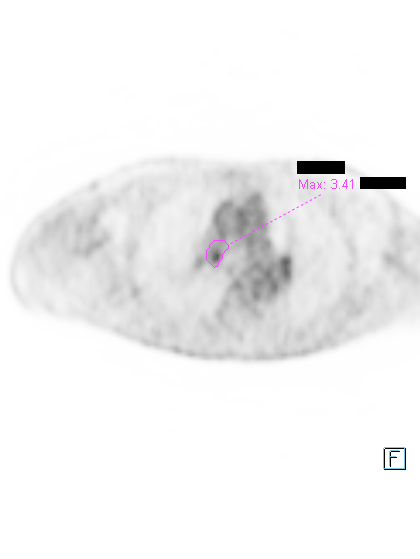

[8 of 8 positions shown; findings below may reference images not displayed]

No prior
PET exams or CT imaging of the neck or chest with prior abdominal
imaging from 5647.
FINDINGS: Mediastinal blood pool activity: SUV max

HEAD/NECK: Asymmetric tonsillar and base of tongue activity favoring
the LEFT but seen diffusely across lymphoid tissue in the
oropharynx, greatest SUV of 7 point 8 (image 61/4)

Large centrally necrotic lymph node in the RIGHT neck at level
II/III shows a maximum SUV of 7.42 measuring 3.0 x 2.7 cm in the
axial plane (image 64/4)

Incidental CT findings: none

CHEST: RIGHT hilar lymph node with a maximum SUV of 4.3 discrete
lymph node not visible. Suspect nodal tissue based on location no
suspicious pulmonary nodules with increased metabolic activity.

Incidental CT findings: Signs of prior granulomatous disease. No
effusion or consolidation. Airways are patent. Signs of median
sternotomy for CABG. Cardiac pacer device, power pack over the LEFT
chest. Leads over the RIGHT heart. No pericardial effusion. Normal
caliber of the aorta and central pulmonary vasculature. Limited
assessment of cardiovascular structures given lack of intravenous
contrast.

ABDOMEN/PELVIS: No abnormal hypermetabolic activity within the
liver, pancreas, adrenal glands, or spleen. No hypermetabolic lymph
nodes in the abdomen or pelvis.

Incidental CT findings: Small hiatal hernia. No acute findings in
the abdomen or in the pelvis. Aortic atherosclerosis without
aneurysm.

SKELETON: Ovoid area in the musculature of the posterior LEFT thigh
tracking towards the hamstring insertion. Fat admixed with soft
tissue and musculature, some mildly increased metabolic activity in
this location with a maximum SUV of 3.8 no increased metabolic
activity in visualized skeletal structures select visualized
skeletal structures skeletal structures.

Incidental CT findings: none

EXTREMITIES: No abnormal hypermetabolic activity in the lower
extremities.

Incidental CT findings: none
IMPRESSION: Findings suspicious for neoplasm of the head and neck with RIGHT
neck adenopathy. Asymmetric uptake at the level of the oropharyngeal
tonsillar tissue in base of tongue greatest on the LEFT, correlation
with direct visualization or cross-sectional imaging with
intravenous contrast may be helpful.

Moderate increased metabolic activity associated with what is likely
a lymph node at the RIGHT hilum. Nonspecific in a patient with
evidence of prior granulomatous disease but suspicious given other
findings.

Fatty area within posterior LEFT thigh amidst musculature tracking
toward hamstring insertion. Perhaps this represents sequela of prior
trauma with asymmetric atrophy. Given boundaries with other,
adjacent musculature atypical lipomatous lesion is not entirely
excluded, consider correlation with MRI if there is no prior imaging
for comparison.

Aortic Atherosclerosis ([id number]-ZJ0.0).

## 2022-12-19 ENCOUNTER — Ambulatory Visit (HOSPITAL_COMMUNITY)
Admission: RE | Admit: 2022-12-19 | Discharge: 2022-12-19 | Disposition: A | Discharge status: Home / Self Care | Payer: Medicare Other | Source: Ambulatory Visit | Attending: Radiation Oncology | Admitting: Radiation Oncology

## 2022-12-19 DIAGNOSIS — C01 Malignant neoplasm of base of tongue: Secondary | ICD-10-CM | POA: Insufficient documentation

## 2022-12-19 MED ORDER — IOHEXOL 300 MG/ML  SOLN
80.0000 mL | Freq: Once | INTRAMUSCULAR | Status: AC | PRN
  Administered 2022-12-19: 80 mL via INTRAVENOUS

## 2022-12-24 ENCOUNTER — Ambulatory Visit (INDEPENDENT_AMBULATORY_CARE_PROVIDER_SITE_OTHER): Payer: Medicare Other

## 2022-12-24 DIAGNOSIS — I459 Conduction disorder, unspecified: Secondary | ICD-10-CM

## 2022-12-24 NOTE — Progress Notes (Deleted)
Office Visit    Patient Name: Steven Mathews Date of Encounter: 12/24/2022  Primary Care Provider:  Herma Carson, MD Primary Cardiologist:  Rollene Rotunda, MD  Chief Complaint    77 year old male with a history of CAD s/p CABG x 3 (LIMA-LAD, SVG-PDA, SVG-OM) in 2008, bradycardia s/p PPM, SVT, hypertension, hyperlipidemia, tongue cancer, and GERD who presents for follow-up related to CAD.   Past Medical History    Past Medical History:  Diagnosis Date   Atrial fibrillation (HCC)    CAD (coronary artery disease)    CABG 2008; 03/13/2018 NSTEMI due to acute thrombotic occlusion of RCA graft, patent LIMA to LAD.   Chronic cough    GERD (gastroesophageal reflux disease)    Hx of adenomatous colonic polyps 04/04/2016   Hyperlipidemia    Hypertension    Myocardial infarct (HCC)    Presence of permanent cardiac pacemaker    SVT (supraventricular tachycardia)    Past Surgical History:  Procedure Laterality Date   CORONARY ARTERY BYPASS GRAFT  03/2006   DIRECT LARYNGOSCOPY N/A 12/11/2020   Procedure: DIRECT LARYNGOSCOPY WITH BIOPSY;  Surgeon: Serena Colonel, MD;  Location: [facility] OR;  Service: ENT;  Laterality: N/A;   ENDOBRONCHIAL ULTRASOUND N/A 01/08/2021   Procedure: ENDOBRONCHIAL ULTRASOUND;  Surgeon: Omar Person, MD;  Location: [facility] ENDOSCOPY;  Service: Pulmonary;  Laterality: N/A;   ESOPHAGOSCOPY N/A 12/11/2020   Procedure: ESOPHAGOSCOPY;  Surgeon: Serena Colonel, MD;  Location: [facility] OR;  Service: ENT;  Laterality: N/A;   FINE NEEDLE ASPIRATION  01/08/2021   Procedure: FINE NEEDLE ASPIRATION;  Surgeon: Omar Person, MD;  Location: [facility] ENDOSCOPY;  Service: Pulmonary;;   HERNIA REPAIR  1964   IR CM INJ ANY COLONIC TUBE W/FLUORO  12/20/2021   IR GASTROSTOMY TUBE MOD SED  02/06/2021   IR GASTROSTOMY TUBE REMOVAL  04/25/2022   LEFT HEART CATH AND CORS/GRAFTS ANGIOGRAPHY N/A 03/13/2018   Procedure: LEFT HEART CATH AND CORS/GRAFTS ANGIOGRAPHY;  Surgeon: Lennette Bihari, MD;  Location: [facility]  INVASIVE CV LAB;  Service: Cardiovascular;  Laterality: N/A;   PACEMAKER IMPLANT N/A 06/26/2020   Procedure: PACEMAKER IMPLANT;  Surgeon: Marinus Maw, MD;  Location: [facility] INVASIVE CV LAB;  Service: Cardiovascular;  Laterality: N/A;   RADICAL NECK DISSECTION Right 12/11/2020   Procedure: RIGHT MODIFIED NECK DISSECTION;  Surgeon: Serena Colonel, MD;  Location: [facility] OR;  Service: ENT;  Laterality: Right;   SUPRAVENTRICULAR TACHYCARDIA ABLATION N/A 09/03/2012   Procedure: SUPRAVENTRICULAR TACHYCARDIA ABLATION;  Surgeon: Marinus Maw, MD;  Location: [facility] CATH LAB;  Service: Cardiovascular;  Laterality: N/A;   TONSILLECTOMY Bilateral 12/11/2020   Procedure: BILATERAL TONSILLECTOMY;  Surgeon: Serena Colonel, MD;  Location: [facility] OR;  Service: ENT;  Laterality: Bilateral;   VIDEO BRONCHOSCOPY  01/08/2021   Procedure: VIDEO BRONCHOSCOPY WITHOUT FLUORO;  Surgeon: Omar Person, MD;  Location: [facility] ENDOSCOPY;  Service: Pulmonary;;    Allergies  No Known Allergies   Labs/Other Studies Reviewed    The following studies were reviewed today:  Cardiac Studies & Procedures   CARDIAC CATHETERIZATION  CARDIAC CATHETERIZATION 03/13/2018  Narrative  Ost LAD to Prox LAD lesion is 40% stenosed.  Prox LAD lesion is 30% stenosed.  Prox Cx lesion is 55% stenosed.  Origin to Prox Graft lesion is 100% stenosed.  Prox RCA lesion is 80% stenosed.  Prox RCA to Mid RCA lesion is 95% stenosed.  Mid RCA to Dist RCA lesion is 100% stenosed.  Prox Graft to Dist Graft lesion is 100% stenosed.  LV end diastolic pressure is mildly elevated.  There is mild left ventricular systolic dysfunction.  Ost 1st Mrg lesion is 70% stenosed.  Multivessel native coronary obstructive disease with 40 and 30% proximal LAD stenoses with competitive filling of the mid LAD via LIMA graft; small patent ramus intermediate vessel; 50 to 60% eccentric proximal left circumflex stenosis with smooth 70% ostial circumflex marginal stenosis  with evidence for a very distal segment of the graft anastomosing into the mid OM vessel without competitive filling suggesting graft occlusion; 80% and 95% proximal to mid native RCA stenoses with diffuse 60% mid stenosis followed by total occlusion.  There is evidence for left to right collateralization to the PDA and distal vessels via the left coronary injection.  Patent LIMA graft supplying the mid LAD.  Probable old occlusion of the vein graft which had supplied the obtuse marginal vessel.  Probable acutely occluded vein graft supplying the distal RCA with extensive thrombus throughout and TIMI 0 flow beyond the proximal third of the graft.  Mild LV dysfunction with an ejection fraction of 40 to 45% and evidence for focal basal inferior hypocontractility and focal apical to apical inferior hypocontractility.  LVEDP 19 mmHg.  RECOMMENDATION: Medical therapy.  The patient's mild ST elevation inferiorly most likely is due to extensive thrombus in the occluded vein graft supplying the distal RCA.  The patient had been off aspirin for over a week.  Will initiate Plavix 300 mg which was given in the lab and 75 mg daily for dual antiplatelet therapy.  Recommend aggressive medical therapy for concomitant CAD.  Aggressive lipid-lowering therapy with target LDL less than 70.  Will obtain 2D echo Doppler study.  We will try to to obtain more specific records of his CABG revascularization.  Findings Coronary Findings Diagnostic  Dominance: Right  Left Anterior Descending Ost LAD to Prox LAD lesion is 40% stenosed. Prox LAD lesion is 30% stenosed.  Left Circumflex Prox Cx lesion is 55% stenosed.  First Obtuse Marginal Branch Ost 1st Mrg lesion is 70% stenosed.  Right Coronary Artery Prox RCA lesion is 80% stenosed. Prox RCA to Mid RCA lesion is 95% stenosed. Mid RCA to Dist RCA lesion is 100% stenosed.  Acute Marginal Branch Vessel is small in size.  Right Posterior Descending  Artery Collaterals RPDA filled by collaterals from 1st Sept.  Right Posterior Atrioventricular Artery Vessel is small in size.  LIMA Graft To Mid LAD  Graft To 1st Mrg Origin to Prox Graft lesion is 100% stenosed.  Graft To RPDA Prox Graft to Dist Graft lesion is 100% stenosed.  Intervention  No interventions have been documented.     ECHOCARDIOGRAM  ECHOCARDIOGRAM COMPLETE 03/15/2018  Narrative ECHOCARDIOGRAM REPORT    Patient Name:   TREVEL NICCOLI Date of Exam: 03/15/2018 Medical Rec #:  [id number]     Height:       72.0 in Accession #:    [id number]    Weight:       192.7 lb Date of Birth:  1945/02/26      BSA:          2.10 m Patient Age:    72 years      BP:           137/77 mmHg Patient Gender: M             HR:           73 bpm. Exam Location:  Inpatient   Procedure: 2D Echo  Indications:  Acute myocardial infarction 410  History:        Patient has prior history of Echocardiogram examinations, most recent 10/17/2015. Previous Myocardial Infarction, Prior CABG; Risk Factors: Dyslipidemia.  Sonographer:    Ross Ludwig RDCS (AE) Referring Phys: [id number] [facility]  IMPRESSIONS   1. The left ventricle has normal systolic function of 60-65%. The cavity size was normal. There is concentric left ventricular hypertrophy. Echo evidence of normal diastolic relaxation. 2. The right ventricle has moderately reduced systolic function. The cavity was mildly enlarged. There is no increase in right ventricular wall thickness. 3. The mitral valve is normal in structure. There is mild thickening. There is mild mitral annular calcification present. 4. The tricuspid valve is normal in structure. 5. The aortic valve is tricuspid There is mild thickening and sclerosis without any evidence of stenosis of the aortic valve. 6. There is mild dilatation of the aortic root. 7. The inferior vena cava was dilated in size with >50% respiratory variability. 8. No evidence of left  ventricular regional wall motion abnormalities.  FINDINGS Left Ventricle: The left ventricle has normal systolic function of 60-65%. The cavity size was normal. There is concentric left ventricular hypertrophy. Echo evidence of normal diastolic relaxation No evidence of left ventricular regional wall motion abnormalities.. Right Ventricle: The right ventricle has moderately reduced systolic function. The cavity was mildly enlarged. There is no increase in right ventricular wall thickness. Left Atrium: left atrial size was normal in size Right Atrium: right atrial size was normal in size Interatrial Septum: No atrial level shunt detected by color flow Doppler.  Pericardium: There is no evidence of pericardial effusion. Mitral Valve: The mitral valve is normal in structure. There is mild thickening. There is mild mitral annular calcification present. Mitral valve regurgitation is mild by color flow Doppler. Tricuspid Valve: The tricuspid valve is normal in structure. Tricuspid valve regurgitation is mild by color flow Doppler. Aortic Valve: The aortic valve is tricuspid There is mild thickening and sclerosis without any evidence of stenosis of the aortic valve. Aortic valve regurgitation was not visualized by color flow Doppler. Pulmonic Valve: The pulmonic valve was grossly normal. Pulmonic valve regurgitation is not visualized by color flow Doppler. Aorta: There is mild dilatation of the aortic root. Venous: The inferior vena cava is dilated in size with greater than 50% respiratory variability.  LEFT VENTRICLE PLAX 2D (Teich) LV EF:          61.7 %   Diastology LVIDd:          5.10 cm  LV e' lateral:   13.90 cm/s LVIDs:          3.40 cm  LV E/e' lateral: 4.1 LV PW:          1.10 cm  LV e' medial:    7.51 cm/s LV IVS:         1.00 cm  LV E/e' medial:  7.6 LVOT diam:      2.30 cm LV SV:          76 ml LVOT Area:      4.15 cm  RIGHT VENTRICLE RV S prime:     6.85 cm/s TAPSE (M-mode):  1.3 cm RVSP:           27.4 mmHg  LEFT ATRIUM             Index       RIGHT ATRIUM           Index LA diam:  4.70 cm 2.24 cm/m  RA Pressure: 8 mmHg LA Vol (A2C):   62.5 ml 29.80 ml/m RA Area:     19.70 cm LA Vol (A4C):   44.8 ml 21.36 ml/m RA Volume:   47.90 ml  22.84 ml/m LA Biplane Vol: 54.1 ml 25.80 ml/m AORTIC VALVE LVOT Vmax:   99.30 cm/s LVOT Vmean:  61.700 cm/s LVOT VTI:    0.183 m  AORTA Ao Root diam: 4.10 cm  MITRAL VALVE              TR Peak grad: 19.4 mmHg MV Area (PHT): 2.71 cm   TR Vmax:      228.00 cm/s MV PHT:        81.20 msec RVSP:         27.4 mmHg MV Decel Time: 280 msec MV E velocity: 57.20 cm/s MV A velocity: 46.20 cm/s MV E/A ratio:  1.24   Prentice Docker MD Electronically signed by Prentice Docker MD Signature Date/Time: 03/15/2018/3:10:04 PM    Final    MONITORS  LONG TERM MONITOR (3-14 DAYS) 05/03/2020  Narrative  Patch Wear Time: 3 days and 9 hours (2022-03-16T10:22:21-0400 to 2022-03-19T20:17:13-0400) Patient had a min HR of 30 bpm, max HR of 197 bpm, and avg HR of 88 bpm. Predominant underlying rhythm was Sinus Rhythm. First Degree AV Block was present. Bundle Branch Block/IVCD was present. Slight P wave morphology changes were noted. 1 run of Ventricular Tachycardia occurred lasting 4 beats with a max rate of 197 bpm (avg 174 bpm). 2 episode(s) of AV Block (High Grade AV Block) occurred, lasting a total of 5 secs. Second Degree AV Block-Mobitz I (Wenckebach) was present. Wenckebach was detected within +/- 45 seconds of symptomatic patient event(s). Isolated SVEs were occasional (1.2%, 5174), SVE Couplets were rare (<1.0%, 500), and SVE Triplets were rare (<1.0%, 144). Isolated VEs were rare (<1.0%), VE Couplets were rare (<1.0%), and no VE Triplets were present.  Predominant rhythm was Normal sinus Tachycardia possibly sinus vs supraventricular noted Brief runs of ventricular tachycardia with the longest being 4 beats ventricular  tach Brief runs of AV block complete but brief. First degree block noted and Mobitz 1 Patient trigger events happened during sinus with episodes of blocked PACs.          Recent Labs: 06/21/2022: TSH 5.714  Recent Lipid Panel    Component Value Date/Time   CHOL 158 04/25/2020 0857   TRIG 142 04/25/2020 0857   HDL 47 04/25/2020 0857   CHOLHDL 3.4 04/25/2020 0857   CHOLHDL 4.2 03/13/2018 2055   VLDL 14 03/13/2018 2055   LDLCALC 86 04/25/2020 0857   LDLDIRECT 114.1 06/16/2012 1032    History of Present Illness    77 year old male with the above past medical history including CAD s/p CABG x 3 (LIMA-LAD, SVG-PDA, SVG-OM) in 2008, bradycardia s/p PPM, SVT, hypertension, hyperlipidemia, tongue cancer, and GERD.   He has a history of remote CABG in 2008.  He was hospitalized in February 2020 in the setting of inferior STEMI. Cardiac catheterization revealed 40% ostial to proximal LAD lesion, 30% proximal LAD lesion, 50% proximal left circumflex lesion, 1 9% occlusion of SVG to distal RCA with extensive thrombus, patent LIMA-LAD, chronically occluded SVG-OM, EF 40 to 45%.  Medical therapy was recommended.  Echocardiogram at the time showed EF 60 to 65%, moderately reduced RV EF, mild dilation of the aortic root.  He was noted to have Mobitz 1 second-degree heart block, beta-blocker was held and later reintroduced.  He subsequent developed symptomatic bradycardia with Mobitz type II and underwent dual-chamber pacemaker placement.  He was last seen in the office on 10/29/2022 and was stable from a cardiac standpoint. He noted a several week history of intermittent left arm tingling, dizziness and balance issues.  He declined individual therapy, ischemic evaluation.   He presents today for follow-up.  Since his last visit he has   1. CAD:  S/p CABG x 3 (LIMA-LAD, SVG-PDA, SVG-OM) in 2008.  Cardiac catheterization revealed 40% ostial to proximal LAD lesion, 30% proximal LAD lesion, 50% proximal left  circumflex lesion, 1 9% occlusion of SVG to distal RCA with extensive thrombus, patent LIMA-LAD, chronically occluded SVG-OM, EF 40 to 45%.  Medical therapy was recommended.  Echocardiogram at the time showed EF 60 to 65%, moderately reduced RV EF, mild dilation of the aortic root.  He notes a several week history of left arm tingling, not similar to prior anginal equivalent.  He denies any chest pain, dyspnea. We discussed possible ischemic evaluation with cardiac PET stress test, he would prefer to continue to monitor his symptoms before proceeding. We discussed possible antianginal therapy, he declines at this time. Provided prescription for as needed nitroglycerin. Reviewed ED precautions.  Will resume aspirin, metoprolol (will lower dose to 25 mg daily given BP), and Crestor.   2. Bradcardia/SVT: S/p PPM.  Most recent device interrogation showed normal device function, 1 VHR detection lasting 6 beats.  He notes occasional dizziness, he denies any palpitations, presyncope or syncope.  Resume metoprolol as above.   3. Hypertension: BP well controlled. Continue current antihypertensive regimen.    4. Hyperlipidemia: No recent LDL on file.  Will resume Crestor as above.  Will plan for fasting lipid panel, LFTs at next follow-up visit.   5. Disposition: Follow-up in  Home Medications    Current Outpatient Medications  Medication Sig Dispense Refill   aspirin 81 MG chewable tablet Chew 1 tablet (81 mg total) by mouth daily. 90 tablet 3   fluticasone (FLONASE) 50 MCG/ACT nasal spray Place 1 spray into both nostrils daily. 16 g 3   metoprolol succinate (TOPROL XL) 25 MG 24 hr tablet Take 1 tablet (25 mg total) by mouth daily. 90 tablet 3   nitroGLYCERIN (NITROSTAT) 0.4 MG SL tablet Place 1 tablet (0.4 mg total) under the tongue every 5 (five) minutes as needed for chest pain. 90 tablet 3   rosuvastatin (CRESTOR) 40 MG tablet Take 1 tablet (40 mg total) by mouth daily. 90 tablet 3   sodium fluoride  (PREVIDENT 5000 PLUS) 1.1 % CREA dental cream Place 1 pea-size drop into each tooth space of fluoride trays once a day at bedtime.  Leave trays in for 5 minutes and then remove.  Spit out excess fluoride, but DO NOT rinse with water, eat or drink for at least 30 minutes after use. 51 g 6   No current facility-administered medications for this visit.     Review of Systems    ***.  All other systems reviewed and are otherwise negative except as noted above.    Physical Exam    VS:  There were no vitals taken for this visit. , BMI There is no height or weight on file to calculate BMI.     GEN: Well nourished, well developed, in no acute distress. HEENT: normal. Neck: Supple, no JVD, carotid bruits, or masses. Cardiac: RRR, no murmurs, rubs, or gallops. No clubbing, cyanosis, edema.  Radials/DP/PT 2+ and equal bilaterally.  Respiratory:  Respirations regular and unlabored, clear to auscultation bilaterally. GI: Soft, nontender, nondistended, BS + x 4. MS: no deformity or atrophy. Skin: warm and dry, no rash. Neuro:  Strength and sensation are intact. Psych: Normal affect.  Accessory Clinical Findings    ECG personally reviewed by me today -    - no acute changes.   Lab Results  Component Value Date   WBC 5.7 02/06/2021   HGB 13.5 02/06/2021   HCT 39.4 02/06/2021   MCV 92.7 02/06/2021   PLT 179 02/06/2021   Lab Results  Component Value Date   CREATININE 0.95 12/18/2021   BUN 21 12/18/2021   NA 134 (L) 12/18/2021   K 4.3 12/18/2021   CL 100 12/18/2021   CO2 28 12/18/2021   Lab Results  Component Value Date   ALT 10 12/18/2021   AST 15 12/18/2021   ALKPHOS 46 12/18/2021   BILITOT 0.3 12/18/2021   Lab Results  Component Value Date   CHOL 158 04/25/2020   HDL 47 04/25/2020   LDLCALC 86 04/25/2020   LDLDIRECT 114.1 06/16/2012   TRIG 142 04/25/2020   CHOLHDL 3.4 04/25/2020    No results found for: "HGBA1C"  Assessment & Plan    1.  ***  No BP recorded.   {Refresh Note OR Click here to enter BP  :1}***   Joylene Grapes, NP 12/24/2022, 10:45 AM

## 2022-12-26 ENCOUNTER — Ambulatory Visit: Payer: Medicare Other | Admitting: Nurse Practitioner

## 2022-12-26 DIAGNOSIS — I10 Essential (primary) hypertension: Secondary | ICD-10-CM

## 2022-12-26 DIAGNOSIS — I471 Supraventricular tachycardia, unspecified: Secondary | ICD-10-CM

## 2022-12-26 DIAGNOSIS — E785 Hyperlipidemia, unspecified: Secondary | ICD-10-CM

## 2022-12-26 DIAGNOSIS — I2510 Atherosclerotic heart disease of native coronary artery without angina pectoris: Secondary | ICD-10-CM

## 2022-12-26 DIAGNOSIS — R001 Bradycardia, unspecified: Secondary | ICD-10-CM

## 2022-12-26 LAB — CUP PACEART REMOTE DEVICE CHECK
Battery Remaining Longevity: 107 mo
Battery Voltage: 2.99 V
Brady Statistic AP VP Percent: 6.95 %
Brady Statistic AP VS Percent: 0.03 %
Brady Statistic AS VP Percent: 90.49 %
Brady Statistic AS VS Percent: 2.53 %
Brady Statistic RA Percent Paced: 7.09 %
Brady Statistic RV Percent Paced: 97.44 %
Date Time Interrogation Session: 20241120132933
Implantable Lead Connection Status: 753985
Implantable Lead Connection Status: 753985
Implantable Lead Implant Date: 20220523
Implantable Lead Implant Date: 20220523
Implantable Lead Location: 753859
Implantable Lead Location: 753860
Implantable Lead Model: 3830
Implantable Lead Model: 5076
Implantable Pulse Generator Implant Date: 20220523
Lead Channel Impedance Value: 266 Ohm
Lead Channel Impedance Value: 361 Ohm
Lead Channel Impedance Value: 475 Ohm
Lead Channel Impedance Value: 475 Ohm
Lead Channel Pacing Threshold Amplitude: 0.75 V
Lead Channel Pacing Threshold Amplitude: 1 V
Lead Channel Pacing Threshold Pulse Width: 0.4 ms
Lead Channel Pacing Threshold Pulse Width: 0.4 ms
Lead Channel Sensing Intrinsic Amplitude: 1.75 mV
Lead Channel Sensing Intrinsic Amplitude: 1.75 mV
Lead Channel Sensing Intrinsic Amplitude: 17.75 mV
Lead Channel Sensing Intrinsic Amplitude: 17.75 mV
Lead Channel Setting Pacing Amplitude: 1.75 V
Lead Channel Setting Pacing Amplitude: 2.5 V
Lead Channel Setting Pacing Pulse Width: 0.4 ms
Lead Channel Setting Sensing Sensitivity: 1.2 mV
Zone Setting Status: 755011
Zone Setting Status: 755011

## 2022-12-26 NOTE — Progress Notes (Signed)
Cardiology Office Note:  .   Date:  12/30/2022  ID:  Steven Mathews, DOB Oct 18, 1945, MRN [id number] PCP: Herma Carson, MD   [facility] Providers Cardiologist:  Rollene Rotunda, MD  History of Present Illness: .   Steven Mathews is a very pleasant 77 y.o. male with a history of CAD s/p CABG x 3 (LIMA-LAD, SVG-PDA, SVG-OM) in 2008, bradycardia s/p PPM, SVT, hypertension, hyperlipidemia, tongue cancer, and GERD. Last saw Steven Person, DNP on 10/29/2022.  He comes today without any cardiac complaints.  He is now cancer free on his last appointment with oncology 2 weeks ago.  He had lost approximately 40 pounds with treatment.  He is now trying to eat foods to gain weight.  He has gained about 10 pounds.  He is eating protein and using protein shakes supplements.  He would like to check his cholesterol status since he has been eating more to evaluate where he stands.  Oncology is also checking his thyroid studies.  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  He is completely asymptomatic.  He has his pacemaker interrogation per protocol.  Last check on 12/25/2022.  ROS: As above otherwise negative  Studies Reviewed: .      Echocardiogram 03/15/2018 1. The left ventricle has normal systolic function of 60-65%. The cavity  size was normal. There is concentric left ventricular hypertrophy. Echo  evidence of normal diastolic relaxation.   2. The right ventricle has moderately reduced systolic function. The  cavity was mildly enlarged. There is no increase in right ventricular wall  thickness.   3. The mitral valve is normal in structure. There is mild thickening.  There is mild mitral annular calcification present.   4. The tricuspid valve is normal in structure.   5. The aortic valve is tricuspid There is mild thickening and sclerosis  without any evidence of stenosis of the  aortic valve.   6. There is mild dilatation of the aortic root.   7. The inferior vena cava was dilated in size with >50% respiratory  variability.   8. No evidence of left ventricular regional wall motion abnormalities.    Physical Exam:   VS:  BP 98/64 (BP Location: Left Arm, Patient Position: Sitting, Cuff Size: Normal)   Pulse 69   Ht 6' (1.829 m)   Wt 170 lb 9.6 oz (77.4 kg)   SpO2 96%   BMI 23.14 kg/m    Wt Readings from Last 3 Encounters:  12/30/22 170 lb 9.6 oz (77.4 kg)  12/27/22 168 lb 9.6 oz (76.5 kg)  10/29/22 161 lb (73 kg)    GEN: Well nourished, well developed in no acute distress NECK: No JVD; No carotid bruits, well-healed scars noted on the right.   CARDIAC: RRR, no murmurs, rubs, gallops RESPIRATORY:  Clear to auscultation without rales, wheezing or rhonchi  ABDOMEN: Soft, non-tender, non-distended EXTREMITIES:  No edema; No deformity   ASSESSMENT AND PLAN: .    Hypercholesterolemia: Remains on rosuvastatin 40 mg daily.  He has not had lipids and LFTs are drawn and is requesting they be done today.  He is fasting.  Goal of LDL less than 70.  2.  Coronary artery disease: History of CABG x 3 in 2008, he is completely asymptomatic.  Remains active.  He denies any chest pain or dyspnea on exertion.  Continue secondary prevention with blood pressure control, lipid management, and  Mediterranean diet.  Aspirin 81 mg daily as well.  3.  Hypertension: Excellent control of blood pressure today.  He remains only on metoprolol 25 mg daily.  This is not causing any fatigue or dizziness.    4.  Cancer of the tongue: Has been followed by oncology.  Has been told he is cancer free recently.  They are continuing to check thyroid studies.  He is trying to gain back some weight that he lost and has been eating more.  He is supplementing his diet with protein shakes.   Signed, Steven Mathews. Liborio Nixon, ANP, AACC

## 2022-12-27 ENCOUNTER — Other Ambulatory Visit: Payer: Self-pay

## 2022-12-27 ENCOUNTER — Ambulatory Visit
Admission: RE | Admit: 2022-12-27 | Discharge: 2022-12-27 | Disposition: A | Discharge status: Home / Self Care | Payer: Medicare Other | Source: Ambulatory Visit | Attending: Radiation Oncology | Admitting: Radiation Oncology

## 2022-12-27 VITALS — BP 111/63 | HR 63 | Temp 97.3°F | Resp 20 | Wt 168.6 lb

## 2022-12-27 DIAGNOSIS — K449 Diaphragmatic hernia without obstruction or gangrene: Secondary | ICD-10-CM | POA: Insufficient documentation

## 2022-12-27 DIAGNOSIS — I700 Atherosclerosis of aorta: Secondary | ICD-10-CM | POA: Insufficient documentation

## 2022-12-27 DIAGNOSIS — C770 Secondary and unspecified malignant neoplasm of lymph nodes of head, face and neck: Secondary | ICD-10-CM

## 2022-12-27 DIAGNOSIS — M47812 Spondylosis without myelopathy or radiculopathy, cervical region: Secondary | ICD-10-CM | POA: Diagnosis not present

## 2022-12-27 DIAGNOSIS — Z79899 Other long term (current) drug therapy: Secondary | ICD-10-CM | POA: Insufficient documentation

## 2022-12-27 DIAGNOSIS — Z923 Personal history of irradiation: Secondary | ICD-10-CM | POA: Diagnosis not present

## 2022-12-27 DIAGNOSIS — Z7982 Long term (current) use of aspirin: Secondary | ICD-10-CM | POA: Insufficient documentation

## 2022-12-27 DIAGNOSIS — R946 Abnormal results of thyroid function studies: Secondary | ICD-10-CM | POA: Diagnosis not present

## 2022-12-27 DIAGNOSIS — C01 Malignant neoplasm of base of tongue: Secondary | ICD-10-CM | POA: Insufficient documentation

## 2022-12-27 DIAGNOSIS — Z1329 Encounter for screening for other suspected endocrine disorder: Secondary | ICD-10-CM

## 2022-12-27 NOTE — Progress Notes (Signed)
Oncology Nurse Navigator Documentation   Per patient's 12/27/22 post-treatment follow-up with Dr. Basilio Cairo, sent fax to [facility] Scheduling with request Mr. Stangeland be contacted and scheduled for routine post-RT follow-up with Dr. Pollyann Kennedy in 3 months.  Notification of successful fax transmission received.   Hedda Slade RN, BSN, OCN Head & Neck Oncology Nurse Navigator [facility] Phone # [phone]  Fax # [phone]

## 2022-12-27 NOTE — Progress Notes (Signed)
Radiation Oncology         [phone] ________________________________  Name: Steven Mathews MRN: [id number]  Date: 12/27/2022  DOB: [birth year]  Follow-Up Visit Note  Outpatient  CC: Steven Carson, MD  Steven Carson, MD  Diagnosis and Prior Radiotherapy:    ICD-10-CM   1. 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       Cancer Staging  Malignant neoplasm of base of tongue (HCC) Staging form: Pharynx - HPV-Mediated Oropharynx, AJCC 8th Edition - Clinical stage from 12/27/2020: Stage II (cT1, cN2, cM0, p16+) - Signed by Lonie Peak, MD on 12/27/2020 Stage prefix: Initial diagnosis   CHIEF COMPLAINT: Here for follow-up and surveillance of throat cancer  Narrative:    Steven Mathews presents today for follow-up after completing radiation to his base of tongue on 03/13/2021 and to review CT scan results from 12/19/2022  Pain issues, if any: Denies any mouth or throat pain, but does still have discomfort with swallowing Using a feeding tube?: N/A Weight changes, if any:  Wt Readings from Last 3 Encounters:  12/27/22 168 lb 9.6 oz (76.5 kg)  10/29/22 161 lb (73 kg)  06/20/22 156 lb 9.6 oz (71 kg)   Swallowing issues, if any: As long as he has something to drink or there is a gravy/sauce he reports he can eat just about anything he has an appetite for. Smoking or chewing tobacco? None Using fluoride toothpaste daily? Yes--denies any dental concerns or mouth sores. Reports he saw his dentist a couple weeks ago. Last ENT visit was on: Not since he saw Steven Mathews on 03/15/2022 --Physical Exam:  Healthy-appearing in no distress. Breathing and voice are clear. Ear canals drums and middle ears look healthy. Nasal exam is unremarkable. Oral cavity and pharynx reveals healthy dentition, no mucosal lesions identified. Indirect laryngoscopy reveals supraglottic swelling and some mucoid secretions otherwise normal exam of the  larynx and hypopharynx. No palpable neck masses.  --Procedures:  none  --Impression & Plans:  Stable, no evidence of recurrent disease. Recheck 3 months or sooner as needed.   Other notable issues, if any: Overall reports he's doing well and denies any new issues or concerns, however he has intermittent numbness in his left upper arm that he is wondering about.  He is already talked about it with his cardiologist.     ALLERGIES:  has No Known Allergies.  Meds: Current Outpatient Medications  Medication Sig Dispense Refill   fluorouracil (EFUDEX) 5 % cream Apply 1 Application topically 2 (two) times daily.     aspirin 81 MG chewable tablet Chew 1 tablet (81 mg total) by mouth daily. 90 tablet 3   fluticasone (FLONASE) 50 MCG/ACT nasal spray Place 1 spray into both nostrils daily. 16 g 3   metoprolol succinate (TOPROL XL) 25 MG 24 hr tablet Take 1 tablet (25 mg total) by mouth daily. 90 tablet 3   nitroGLYCERIN (NITROSTAT) 0.4 MG SL tablet Place 1 tablet (0.4 mg total) under the tongue every 5 (five) minutes as needed for chest pain. 90 tablet 3   rosuvastatin (CRESTOR) 40 MG tablet Take 1 tablet (40 mg total) by mouth daily. 90 tablet 3   sodium fluoride (PREVIDENT 5000 PLUS) 1.1 % CREA dental cream Place 1 pea-size drop into each tooth space of fluoride trays once a day at bedtime.  Leave trays in for 5 minutes and then remove.  Spit out excess  fluoride, but DO NOT rinse with water, eat or drink for at least 30 minutes after use. 51 g 6   No current facility-administered medications for this encounter.    Physical Findings: The patient is in no acute distress. Patient is alert and oriented.  weight is 168 lb 9.6 oz (76.5 kg). His temperature is 97.3 F (36.3 C) (abnormal). His blood pressure is 111/63 and his pulse is 63. His respiration is 20 and oxygen saturation is 99%. .    General: Alert and oriented, in no acute distress HEENT: Head is normocephalic. Extraocular movements are  intact. Oropharynx - no thrush or tumor.   Neck: Neck is supple, no palpable cervical or supraclavicular lymphadenopathy. Extremities: No cyanosis or edema. Lymphatics: see Neck Exam Skin: intact over neck, healed well MSK: Well-nourished, ambulatory.   Psychiatric: Judgment and insight are intact. Affect is appropriate.    Lab Findings: Lab Results  Component Value Date   WBC 5.7 02/06/2021   HGB 13.5 02/06/2021   HCT 39.4 02/06/2021   MCV 92.7 02/06/2021   PLT 179 02/06/2021    Radiographic Findings: CUP PACEART REMOTE DEVICE CHECK  Result Date: 12/26/2022 Scheduled remote reviewed. Normal device function.  Next remote 91 days. LA, CVRS  CT Chest W Contrast  Result Date: 12/24/2022 CLINICAL DATA:  Tongue cancer.  Monitor * Tracking Code: BO * EXAM: CT CHEST WITH CONTRAST TECHNIQUE: Multidetector CT imaging of the chest was performed during intravenous contrast administration. RADIATION DOSE REDUCTION: This exam was performed according to the departmental dose-optimization program which includes automated exposure control, adjustment of the mA and/or kV according to patient size and/or use of iterative reconstruction technique. CONTRAST:  80mL OMNIPAQUE IOHEXOL 300 MG/ML  SOLN COMPARISON:  Chest CT 12/10/2021 and older. FINDINGS: Cardiovascular: Left chest battery pack with pacemaker leads along the right side of the heart. The heart is nonenlarged. No pericardial effusion. Thoracic aorta has a normal course and caliber with mild atherosclerotic calcified plaque. Status post median sternotomy with bypass grafts. Grafts are not evaluated today. Mediastinum/Nodes: No specific abnormal lymph node enlargement identified in the axillary regions, hilum or mediastinum. Only a few small lymph nodes identified in the hilum. Example measures 9 mm in short axis in the right hilum on series 4, image 30. Retrospect when measured in the same fashion this same node would measure 9 mm. Small hiatal  hernia. Small thyroid gland. Please see separate dictation neck CT scan. Lungs/Pleura: Lungs are without consolidation, pneumothorax or effusion. There is some areas of scattered interstitial septal thickening, scarring and fibrotic change. Stable nodule in the right posteroinferior right lower lobe, costophrenic sulcus on series 5, image 155 measuring 5 mm. This nodule has been present since at least November 2022 demonstrating over 2 years of stability. No additional imaging follow-up. No new dominant nodule identified. Calcified right lower lobe nodule identified consistent with old granulomatous disease. Upper Abdomen: Adrenal glands are preserved in the upper abdomen. Multiple splenic granulomas. Musculoskeletal: Mild degenerative changes along the spine. IMPRESSION: No significant interval change. Postop chest. Small hiatal hernia. Stable small right hilar lymph node Aortic Atherosclerosis (ICD10-I70.0). Electronically Signed   By: Karen Kays M.D.   On: 12/24/2022 10:05   CT Soft Tissue Neck W Contrast  Result Date: 12/23/2022 CLINICAL DATA:  Head/neck cancer, monitor.  Follow-up tongue cancer. EXAM: CT NECK WITH CONTRAST TECHNIQUE: Multidetector CT imaging of the neck was performed using the standard protocol following the bolus administration of intravenous contrast. RADIATION DOSE REDUCTION: This exam  was performed according to the departmental dose-optimization program which includes automated exposure control, adjustment of the mA and/or kV according to patient size and/or use of iterative reconstruction technique. CONTRAST:  80mL OMNIPAQUE IOHEXOL 300 MG/ML  SOLN COMPARISON:  PET-CT 06/11/2021. FINDINGS: Pharynx and larynx: Normal. No mass or swelling. Salivary glands: Atrophy of the right parotid and submandibular glands, likely sequela of prior radiation therapy. Thyroid: Normal. Lymph nodes: No suspicious cervical lymphadenopathy. Vascular: Atherosclerotic calcifications of the carotid bulbs.  Limited intracranial: Unremarkable. Visualized orbits: Unremarkable. Mastoids and visualized paranasal sinuses: Well aerated. Skeleton: Mild cervical spondylosis without high-grade spinal canal stenosis. No suspicious bone lesions. Upper chest: Unremarkable. Other: None. IMPRESSION: 1. No evidence of recurrent or metastatic disease in the neck. 2. Atrophy of the right parotid and submandibular glands, likely sequela of prior radiation therapy. Electronically Signed   By: Orvan Falconer M.D.   On: 12/23/2022 08:55    Impression/Plan:    1) Head and Neck Cancer Status: No evidence of disease.  2) Nutritional Status: He no longer has his feeding tube and is starting to gain a bit of weight.  He is pleased with his progress. Wt Readings from Last 3 Encounters:  12/27/22 168 lb 9.6 oz (76.5 kg)  10/29/22 161 lb (73 kg)  06/20/22 156 lb 9.6 oz (71 kg)    3)  Swallowing: He will continue speech-language pathology exercises  4) Dental: Encouraged to continue regular followup with dentistry, and dental hygiene including fluoride rinses.  At this time he is not receiving any prescription strength fluoride treatment so I suggested he try ACT mouthwash without alcohol, with fluoride.  5) Thyroid function:    Free T4 normal was normal at his spring appointment with mildly elevated TSH.  We will arrange for him to get these labs rechecked at his survivorship visit next year.  No symptomatic complaints.  Continue to check annually. Lab Results  Component Value Date   TSH 5.714 (H) 06/21/2022   6) He has some intermittent numbness in his left arm which could be related to degenerative changes in the cervical spine as evidenced by CT scan.  He will talk about this further with his primary doctor.  7) he will see Steven Mathews in 3 months.  He will see our survivorship specialist in 6 months here at the cancer center.  I will see him back as needed.  He is pleased with this plan.  On date of service, in total,  I spent 30 minutes on this encounter. Patient was seen in person.    _____________________________________    Lonie Peak, MD

## 2022-12-27 NOTE — Progress Notes (Signed)
Steven Mathews presents today for follow-up after completing radiation to his base of tongue on 03/13/2021 and to review CT scan results from 12/19/2022  Pain issues, if any: Denies any mouth or throat pain, but does still have discomfort with swallowing Using a feeding tube?: N/A Weight changes, if any:  Wt Readings from Last 3 Encounters:  12/27/22 168 lb 9.6 oz (76.5 kg)  10/29/22 161 lb (73 kg)  06/20/22 156 lb 9.6 oz (71 kg)   Swallowing issues, if any: As long as he has something to drink or there is a gravy/sauce he reports he can eat just about anything he has an appetite for. Smoking or chewing tobacco? None Using fluoride toothpaste daily? Yes--denies any dental concerns or mouth sores. Reports he saw his dentist a couple weeks ago. Last ENT visit was on: Not since he saw Steven Mathews on 03/15/2022 --Physical Exam:  Healthy-appearing in no distress. Breathing and voice are clear. Ear canals drums and middle ears look healthy. Nasal exam is unremarkable. Oral cavity and pharynx reveals healthy dentition, no mucosal lesions identified. Indirect laryngoscopy reveals supraglottic swelling and some mucoid secretions otherwise normal exam of the larynx and hypopharynx. No palpable neck masses.  --Procedures:  none  --Impression & Plans:  Stable, no evidence of recurrent disease. Recheck 3 months or sooner as needed.   Other notable issues, if any: Overall reports he's doing well and denies any new issues or concerns

## 2022-12-30 ENCOUNTER — Ambulatory Visit: Payer: Medicare Other | Attending: Adult Health | Admitting: Adult Health

## 2022-12-30 ENCOUNTER — Encounter: Payer: Self-pay | Admitting: Adult Health

## 2022-12-30 VITALS — BP 98/64 | HR 69 | Ht 72.0 in | Wt 170.6 lb

## 2022-12-30 DIAGNOSIS — Z951 Presence of aortocoronary bypass graft: Secondary | ICD-10-CM | POA: Diagnosis present

## 2022-12-30 DIAGNOSIS — Z85810 Personal history of malignant neoplasm of tongue: Secondary | ICD-10-CM | POA: Insufficient documentation

## 2022-12-30 DIAGNOSIS — E785 Hyperlipidemia, unspecified: Secondary | ICD-10-CM | POA: Insufficient documentation

## 2022-12-30 DIAGNOSIS — I10 Essential (primary) hypertension: Secondary | ICD-10-CM | POA: Insufficient documentation

## 2022-12-30 NOTE — Patient Instructions (Signed)
Medication Instructions:  No Changes *If you need a refill on your cardiac medications before your next appointment, please call your pharmacy*   Lab Work: CMET, Lipid Panel  If you have labs (blood work) drawn today and your tests are completely normal, you will receive your results only by: MyChart Message (if you have MyChart) OR A paper copy in the mail If you have any lab test that is abnormal or we need to change your treatment, we will call you to review the results.   Testing/Procedures: No Testing   Follow-Up: At [facility], you and your health needs are our priority.  As part of our continuing mission to provide you with exceptional heart care, we have created designated Provider Care Teams.  These Care Teams include your primary Cardiologist (physician) and Advanced Practice Providers (APPs -  Physician Assistants and Nurse Practitioners) who all work together to provide you with the care you need, when you need it.  We recommend signing up for the patient portal called "MyChart".  Sign up information is provided on this After Visit Summary.  MyChart is used to connect with patients for Virtual Visits (Telemedicine).  Patients are able to view lab/test results, encounter notes, upcoming appointments, etc.  Non-urgent messages can be sent to your provider as well.   To learn more about what you can do with MyChart, go to [email or web address].    Your next appointment:   Keep Scheduled Appointment  Provider:   Rollene Rotunda, MD

## 2022-12-31 ENCOUNTER — Telehealth: Payer: Self-pay

## 2022-12-31 DIAGNOSIS — I10 Essential (primary) hypertension: Secondary | ICD-10-CM

## 2022-12-31 LAB — LIPID PANEL
Chol/HDL Ratio: 2.9 {ratio} (ref 0.0–5.0)
Cholesterol, Total: 157 mg/dL (ref 100–199)
HDL: 55 mg/dL (ref >39)
LDL Chol Calc (NIH): 80 mg/dL (ref 0–99)
Triglycerides: 122 mg/dL (ref 0–149)
VLDL Cholesterol Cal: 22 mg/dL (ref 5–40)

## 2022-12-31 LAB — COMPREHENSIVE METABOLIC PANEL
ALT: 12 [IU]/L (ref 0–44)
AST: 18 [IU]/L (ref 0–40)
Albumin: 4.6 g/dL (ref 3.8–4.8)
Alkaline Phosphatase: 63 [IU]/L (ref 44–121)
BUN/Creatinine Ratio: 14 (ref 10–24)
BUN: 13 mg/dL (ref 8–27)
Bilirubin Total: 0.3 mg/dL (ref 0.0–1.2)
CO2: 26 mmol/L (ref 20–29)
Calcium: 9.9 mg/dL (ref 8.6–10.2)
Chloride: 101 mmol/L (ref 96–106)
Creatinine, Ser: 0.9 mg/dL (ref 0.76–1.27)
Globulin, Total: 2.4 g/dL (ref 1.5–4.5)
Glucose: 106 mg/dL — ABNORMAL HIGH (ref 70–99)
Potassium: 5.6 mmol/L — ABNORMAL HIGH (ref 3.5–5.2)
Sodium: 136 mmol/L (ref 134–144)
Total Protein: 7 g/dL (ref 6.0–8.5)
eGFR: 87 mL/min/{1.73_m2} (ref >59)

## 2022-12-31 NOTE — Telephone Encounter (Addendum)
Called patient regarding results. Left detailed message for patient to advise potassium elevated and will need to repeat Labs. Letter and lab orders mailed to patient 12/31/22.----- Message from Joni Reining sent at 12/31/2022  7:55 AM EST ----- Reviewed the labs.  Potassium is elevated. Will need to repeat this.  He is not on any medications that are increasing the level. KL

## 2023-01-01 ENCOUNTER — Telehealth: Payer: Self-pay

## 2023-01-01 NOTE — Telephone Encounter (Signed)
Patient returned call regarding results. Advised patient to come to lab to repeat potassium level patient stated will have labs drawn next week.

## 2023-01-08 ENCOUNTER — Telehealth: Payer: Self-pay | Admitting: *Deleted

## 2023-01-08 NOTE — Telephone Encounter (Signed)
Scheduled appointment per inbasket message. Patient is aware of the made appointment.

## 2023-01-17 ENCOUNTER — Telehealth: Payer: Self-pay

## 2023-01-17 NOTE — Telephone Encounter (Signed)
Called and spoke with patient and his current appointment for 02/02/23 has been canceled due to seeing ARNP 2 weeks ago. Placed on reminder letter list. Discussed recent abnormal potassium level and he confirmed he plans to have repeat blood lab drawn today.

## 2023-01-18 LAB — BASIC METABOLIC PANEL
BUN/Creatinine Ratio: 15 (ref 10–24)
BUN: 15 mg/dL (ref 8–27)
CO2: 26 mmol/L (ref 20–29)
Calcium: 10 mg/dL (ref 8.6–10.2)
Chloride: 102 mmol/L (ref 96–106)
Creatinine, Ser: 0.99 mg/dL (ref 0.76–1.27)
Glucose: 128 mg/dL — ABNORMAL HIGH (ref 70–99)
Potassium: 4.6 mmol/L (ref 3.5–5.2)
Sodium: 142 mmol/L (ref 134–144)
eGFR: 78 mL/min/{1.73_m2} (ref >59)

## 2023-01-20 NOTE — Progress Notes (Signed)
Remote pacemaker transmission.   

## 2023-01-20 NOTE — Addendum Note (Signed)
Addended by: Geralyn Flash D on: 01/20/2023 02:36 PM   Modules accepted: Orders

## 2023-01-21 ENCOUNTER — Telehealth: Payer: Self-pay

## 2023-01-21 NOTE — Telephone Encounter (Addendum)
Results viewed by patient via Mychart.----- Message from Joni Reining sent at 01/20/2023  7:47 AM EST ----- I have reviewed the labs.  Glucose was elevated but otherwise normal  This was not done fasting.

## 2023-01-23 ENCOUNTER — Ambulatory Visit: Payer: Medicare Other | Admitting: Cardiology

## 2023-03-11 ENCOUNTER — Telehealth: Payer: Self-pay | Admitting: Nurse Practitioner

## 2023-03-11 NOTE — Telephone Encounter (Signed)
Scheduled LAB appointment before survivorship appointment per 2/3 scheduling message. Patient is aware of the appointment made.

## 2023-03-25 ENCOUNTER — Ambulatory Visit (INDEPENDENT_AMBULATORY_CARE_PROVIDER_SITE_OTHER): Payer: Medicare (Managed Care)

## 2023-03-25 DIAGNOSIS — I459 Conduction disorder, unspecified: Secondary | ICD-10-CM

## 2023-03-28 LAB — CUP PACEART REMOTE DEVICE CHECK
Battery Remaining Longevity: 104 mo
Battery Voltage: 2.99 V
Brady Statistic AP VP Percent: 7.06 %
Brady Statistic AP VS Percent: 0.06 %
Brady Statistic AS VP Percent: 90.04 %
Brady Statistic AS VS Percent: 2.84 %
Brady Statistic RA Percent Paced: 7.3 %
Brady Statistic RV Percent Paced: 97.1 %
Date Time Interrogation Session: 20250220111339
Implantable Lead Connection Status: 753985
Implantable Lead Connection Status: 753985
Implantable Lead Implant Date: 20220523
Implantable Lead Implant Date: 20220523
Implantable Lead Location: 753859
Implantable Lead Location: 753860
Implantable Lead Model: 3830
Implantable Lead Model: 5076
Implantable Pulse Generator Implant Date: 20220523
Lead Channel Impedance Value: 266 Ohm
Lead Channel Impedance Value: 361 Ohm
Lead Channel Impedance Value: 456 Ohm
Lead Channel Impedance Value: 475 Ohm
Lead Channel Pacing Threshold Amplitude: 0.625 V
Lead Channel Pacing Threshold Amplitude: 1 V
Lead Channel Pacing Threshold Pulse Width: 0.4 ms
Lead Channel Pacing Threshold Pulse Width: 0.4 ms
Lead Channel Sensing Intrinsic Amplitude: 1.875 mV
Lead Channel Sensing Intrinsic Amplitude: 1.875 mV
Lead Channel Sensing Intrinsic Amplitude: 22.5 mV
Lead Channel Sensing Intrinsic Amplitude: 22.5 mV
Lead Channel Setting Pacing Amplitude: 1.5 V
Lead Channel Setting Pacing Amplitude: 2.5 V
Lead Channel Setting Pacing Pulse Width: 0.4 ms
Lead Channel Setting Sensing Sensitivity: 1.2 mV
Zone Setting Status: 755011
Zone Setting Status: 755011

## 2023-05-01 NOTE — Progress Notes (Signed)
 Remote pacemaker transmission.

## 2023-05-01 NOTE — Addendum Note (Signed)
 Addended by: Elease Etienne A on: 05/01/2023 12:52 PM   Modules accepted: Orders

## 2023-06-16 ENCOUNTER — Inpatient Hospital Stay: Payer: Medicare (Managed Care) | Attending: Nurse Practitioner | Admitting: Nurse Practitioner

## 2023-06-16 ENCOUNTER — Encounter: Payer: Self-pay | Admitting: Nurse Practitioner

## 2023-06-16 ENCOUNTER — Inpatient Hospital Stay: Payer: Medicare (Managed Care)

## 2023-06-16 VITALS — BP 117/77 | HR 80 | Temp 97.7°F | Resp 15 | Wt 162.9 lb

## 2023-06-16 DIAGNOSIS — Z79899 Other long term (current) drug therapy: Secondary | ICD-10-CM | POA: Insufficient documentation

## 2023-06-16 DIAGNOSIS — R5383 Other fatigue: Secondary | ICD-10-CM | POA: Diagnosis not present

## 2023-06-16 DIAGNOSIS — Z7982 Long term (current) use of aspirin: Secondary | ICD-10-CM | POA: Diagnosis not present

## 2023-06-16 DIAGNOSIS — R682 Dry mouth, unspecified: Secondary | ICD-10-CM | POA: Insufficient documentation

## 2023-06-16 DIAGNOSIS — Z85810 Personal history of malignant neoplasm of tongue: Secondary | ICD-10-CM | POA: Diagnosis present

## 2023-06-16 DIAGNOSIS — C01 Malignant neoplasm of base of tongue: Secondary | ICD-10-CM | POA: Diagnosis not present

## 2023-06-16 DIAGNOSIS — Z923 Personal history of irradiation: Secondary | ICD-10-CM | POA: Diagnosis not present

## 2023-06-16 DIAGNOSIS — Z1329 Encounter for screening for other suspected endocrine disorder: Secondary | ICD-10-CM

## 2023-06-16 LAB — T4, FREE: Free T4: 0.74 ng/dL (ref 0.61–1.12)

## 2023-06-16 NOTE — Progress Notes (Signed)
 CLINIC:  Survivorship  REASON FOR VISIT:  Routine long term follow-up for history of head & neck cancer.  INTERVAL HISTORY:  History Steven Mathews returns for follow-up.  Doing well in the interval, still working and playing golf.  Weight fluctuates in general, he does not get much pleasure from eating due to dry mouth and altered taste so he has unintentionally been cutting back.  Drinks plenty of water.  He continues to feel off balance and "not solid" on his feet since completing radiation, and wears out quickly with certain activities.  Denies fall.  [place] had an episode with racing heart and extreme fatigue and started back on his metoprolol  last week  -Pain: Denies -Nutrition/Diet: Regular/PO -Dysphagia?:  Chicken is sometimes difficult -Dental issues?: None, visits 2x per year using fluoride  trays? Yes -Last TSH: 12/2022 and today, pending -Weight: mild weight loss since last visit    -Last ENT visit:  05/2023 -Last Rad Onc visit: 12/2022 -Last Dentist visit: 2 weeks ago    ADDITIONAL REVIEW OF SYSTEMS:  ROS    CURRENT MEDICATIONS:  Current Outpatient Medications on File Prior to Visit  Medication Sig Dispense Refill   aspirin  81 MG chewable tablet Chew 1 tablet (81 mg total) by mouth daily. 90 tablet 3   fluorouracil (EFUDEX) 5 % cream Apply 1 Application topically 2 (two) times daily.     fluticasone  (FLONASE ) 50 MCG/ACT nasal spray Place 1 spray into both nostrils daily. 16 g 3   metoprolol  succinate (TOPROL  XL) 25 MG 24 hr tablet Take 1 tablet (25 mg total) by mouth daily. 90 tablet 3   rosuvastatin  (CRESTOR ) 40 MG tablet Take 1 tablet (40 mg total) by mouth daily. 90 tablet 3   sodium fluoride  (PREVIDENT 5000 PLUS) 1.1 % CREA dental cream Place 1 pea-size drop into each tooth space of fluoride  trays once a day at bedtime.  Leave trays in for 5 minutes and then remove.  Spit out excess fluoride , but DO NOT rinse with water, eat or drink for at  least 30 minutes after use. 51 g 6   nitroGLYCERIN  (NITROSTAT ) 0.4 MG SL tablet Place 1 tablet (0.4 mg total) under the tongue every 5 (five) minutes as needed for chest pain. 90 tablet 3   No current facility-administered medications on file prior to visit.    ALLERGIES:  No Known Allergies   PHYSICAL EXAM:  Vitals:   06/16/23 1235  BP: 117/77  Pulse: 80  Resp: 15  Temp: 97.7 F (36.5 C)  SpO2: 100%   Filed Weights   06/16/23 1235  Weight: 162 lb 14.4 oz (73.9 kg)   General: Well-nourished, well-appearing male in no acute distress.   HEENT: Sclerae anicteric. Oral mucosa is pink and moist without lesions.  Tongue pink, moist, and midline. Oropharynx is pink and moist, without lesions. Lymph: No preauricular, postauricular, cervical, supraclavicular, or infraclavicular lymphadenopathy noted on palpation.   Neck: No palpable masses.  Cardiovascular: Normal rate and rhythm. Respiratory: Clear; breathing non-labored.  GI: Abdomen soft and round. Non-tender, non-distended. Bowel sounds normoactive.   Neuro: No focal deficits. Steady gait.   Psych: Normal mood and affect for situation. Extremities: No edema.  Skin: Warm and dry.    LABORATORY DATA:  TSH/T4 pending  DIAGNOSTIC IMAGING:  None at this visit.    ASSESSMENT & PLAN:  [name] is a pleasant 78 y.o. male with history of of tongue SCC, diagnosed in 2022;  treated with definitive surgery  and adjuvant radiation; completed treatment on/2023.  Patient presents to survivorship clinic today for routine follow-up being discharged from radiation.  Cancer:  [name] is clinically without evidence of disease or recurrence on physical exam today.     Dizziness: He reports dizziness since completing radiation in 2023, often with position changes. He is cautious/careful. BP is low/normal at baseline. He has only had coffee today. Reviewed importance of frequent po with solids and liquids. BG has not been checked recently.  He recently restarted metoprolol . HR appears in normal rate/rhythm today. I encouraged him to f/up with cardiology and PCP for basic labs and close f/up. He agrees.   Nutritional status: [name] experiences weight fluctuation since radiation, currently down 8 pounds in 6 months.  He does not enjoy food due to dry mouth and poor taste.  I recommend he increase nutrition supplements   At risk for dysphagia: No significant dysphagia.  No need for SLP at this time   At risk for neck lymphedema: No evidence of lymphedema.  No need for PT at this time.    At risk for hypothyroidism: The thyroid  gland is often affected after treatment for head & neck cancer.  [name] most recent TSH was slightly abnormal 12/2022, today's level is pending.     At risk for tooth decay/dental concerns: After treatment with radiation for head & neck cancers, patients often experience xerostomia which increases their risk of dental caries. [name] sees a dentist twice per year and uses fluoride .    Lung cancer screening:  Steven Mathews now offers eligible patients lung cancer screening with a low-dose chest CT to aid in early detection, provide more effective treatment options, and ultimately improve survival benefits for patients diagnosed early.  Below is the selection criteria for screening:  Medicare patients: 55-77 years; privately insured patients 55-80 years. Active or former smokers who have quit within the last 15 years. 30+ pack-year history of smoking  Exclusion criteria - No signs/symptoms of lung cancer (i.e., no recent history of hemoptysis and no unexplained weight loss >15 pounds in the last 6 months). Willing and healthy enough to undergo biopsies/surgery if needed.  [name] currently does not meet criteria for lung cancer screening due to being a non-smoker.  Therefore, I have not placed a referral for screening today.    Tobacco & alcohol use: [name] is a non smoker and limits  alcohol. He understands the importance of continuing to remain both tobacco and alcohol-free.    Health maintenance and wellness promotion: Remains active, golfs, continues working and is working on his nutrition. Encouraged to continue  Support services/counseling:  [name] was encouraged to take advantage of our many support services programs, support groups, and/or counseling. The patient was offered support today through active listening and expressive supportive counseling.       Dispo:  -See Dr. Donalee Fruits (ENT) in 09/2023 -Return to cancer center to see Dr. Lurena Sally as needed -Return to cancer center to see Survivorship NP in 1 year -TSH/T4 pending -Recommend return to PCP and cardiology for dizziness, has not had basic labs recently and started himself back on metoprolol  after being off x1 year       A total of 40 minutes was spent in the face-to-face care of this patient, with greater than 50% of that time spent in counseling and care-coordination.    Brindle Leyba, NP Survivorship Program [facility] [phone]

## 2023-06-17 ENCOUNTER — Telehealth: Payer: Self-pay | Admitting: Nurse Practitioner

## 2023-06-17 LAB — TSH: TSH: 7.07 u[IU]/mL — ABNORMAL HIGH (ref 0.350–4.500)

## 2023-06-17 NOTE — Telephone Encounter (Signed)
 Patient scheduled appointments. Patient is aware of all appointment details.

## 2023-06-24 ENCOUNTER — Ambulatory Visit (INDEPENDENT_AMBULATORY_CARE_PROVIDER_SITE_OTHER): Payer: Medicare (Managed Care)

## 2023-06-24 DIAGNOSIS — I459 Conduction disorder, unspecified: Secondary | ICD-10-CM | POA: Diagnosis not present

## 2023-06-25 ENCOUNTER — Ambulatory Visit: Payer: Self-pay | Admitting: Internal Medicine

## 2023-06-25 LAB — CUP PACEART REMOTE DEVICE CHECK
Battery Remaining Longevity: 100 mo
Battery Voltage: 2.99 V
Brady Statistic AP VP Percent: 4.59 %
Brady Statistic AP VS Percent: 0.04 %
Brady Statistic AS VP Percent: 92.78 %
Brady Statistic AS VS Percent: 2.58 %
Brady Statistic RA Percent Paced: 4.75 %
Brady Statistic RV Percent Paced: 97.37 %
Date Time Interrogation Session: 20250519210139
Implantable Lead Connection Status: 753985
Implantable Lead Connection Status: 753985
Implantable Lead Implant Date: 20220523
Implantable Lead Implant Date: 20220523
Implantable Lead Location: 753859
Implantable Lead Location: 753860
Implantable Lead Model: 3830
Implantable Lead Model: 5076
Implantable Pulse Generator Implant Date: 20220523
Lead Channel Impedance Value: 285 Ohm
Lead Channel Impedance Value: 342 Ohm
Lead Channel Impedance Value: 399 Ohm
Lead Channel Impedance Value: 456 Ohm
Lead Channel Pacing Threshold Amplitude: 0.75 V
Lead Channel Pacing Threshold Amplitude: 1.125 V
Lead Channel Pacing Threshold Pulse Width: 0.4 ms
Lead Channel Pacing Threshold Pulse Width: 0.4 ms
Lead Channel Sensing Intrinsic Amplitude: 1.875 mV
Lead Channel Sensing Intrinsic Amplitude: 1.875 mV
Lead Channel Sensing Intrinsic Amplitude: 19.75 mV
Lead Channel Sensing Intrinsic Amplitude: 19.75 mV
Lead Channel Setting Pacing Amplitude: 1.5 V
Lead Channel Setting Pacing Amplitude: 2.5 V
Lead Channel Setting Pacing Pulse Width: 0.4 ms
Lead Channel Setting Sensing Sensitivity: 1.2 mV
Zone Setting Status: 755011
Zone Setting Status: 755011

## 2023-06-27 ENCOUNTER — Ambulatory Visit: Payer: Self-pay | Admitting: Nurse Practitioner

## 2023-08-15 NOTE — Progress Notes (Signed)
 Remote pacemaker transmission.

## 2023-08-15 NOTE — Addendum Note (Signed)
 Addended by: VICCI SELLER A on: 08/15/2023 03:53 PM   Modules accepted: Orders

## 2023-10-02 ENCOUNTER — Ambulatory Visit (INDEPENDENT_AMBULATORY_CARE_PROVIDER_SITE_OTHER): Payer: Medicare (Managed Care)

## 2023-10-02 DIAGNOSIS — I459 Conduction disorder, unspecified: Secondary | ICD-10-CM

## 2023-10-04 LAB — CUP PACEART REMOTE DEVICE CHECK
Battery Remaining Longevity: 96 mo
Battery Voltage: 2.98 V
Brady Statistic AP VP Percent: 14.75 %
Brady Statistic AP VS Percent: 0.07 %
Brady Statistic AS VP Percent: 84.14 %
Brady Statistic AS VS Percent: 1.03 %
Brady Statistic RA Percent Paced: 15.1 %
Brady Statistic RV Percent Paced: 98.9 %
Date Time Interrogation Session: 20250828175738
Implantable Lead Connection Status: 753985
Implantable Lead Connection Status: 753985
Implantable Lead Implant Date: 20220523
Implantable Lead Implant Date: 20220523
Implantable Lead Location: 753859
Implantable Lead Location: 753860
Implantable Lead Model: 3830
Implantable Lead Model: 5076
Implantable Pulse Generator Implant Date: 20220523
Lead Channel Impedance Value: 285 Ohm
Lead Channel Impedance Value: 361 Ohm
Lead Channel Impedance Value: 399 Ohm
Lead Channel Impedance Value: 456 Ohm
Lead Channel Pacing Threshold Amplitude: 0.75 V
Lead Channel Pacing Threshold Amplitude: 1.125 V
Lead Channel Pacing Threshold Pulse Width: 0.4 ms
Lead Channel Pacing Threshold Pulse Width: 0.4 ms
Lead Channel Sensing Intrinsic Amplitude: 1.5 mV
Lead Channel Sensing Intrinsic Amplitude: 1.5 mV
Lead Channel Sensing Intrinsic Amplitude: 19.125 mV
Lead Channel Sensing Intrinsic Amplitude: 19.125 mV
Lead Channel Setting Pacing Amplitude: 1.5 V
Lead Channel Setting Pacing Amplitude: 2.5 V
Lead Channel Setting Pacing Pulse Width: 0.4 ms
Lead Channel Setting Sensing Sensitivity: 1.2 mV
Zone Setting Status: 755011
Zone Setting Status: 755011

## 2023-10-06 ENCOUNTER — Ambulatory Visit: Payer: Self-pay | Admitting: Internal Medicine

## 2023-10-10 NOTE — Progress Notes (Signed)
 Remote PPM Transmission

## 2023-12-10 ENCOUNTER — Other Ambulatory Visit: Payer: Self-pay | Admitting: Nurse Practitioner

## 2023-12-11 ENCOUNTER — Other Ambulatory Visit: Payer: Self-pay | Admitting: Cardiology

## 2024-01-14 ENCOUNTER — Other Ambulatory Visit: Payer: Self-pay | Admitting: Nurse Practitioner

## 2024-01-16 ENCOUNTER — Other Ambulatory Visit: Payer: Self-pay | Admitting: Nurse Practitioner

## 2024-01-21 ENCOUNTER — Ambulatory Visit: Payer: Medicare (Managed Care)

## 2024-01-21 DIAGNOSIS — I459 Conduction disorder, unspecified: Secondary | ICD-10-CM

## 2024-01-22 LAB — CUP PACEART REMOTE DEVICE CHECK
Battery Remaining Longevity: 91 mo
Battery Voltage: 2.98 V
Brady Statistic AP VP Percent: 26.81 %
Brady Statistic AP VS Percent: 0.19 %
Brady Statistic AS VP Percent: 71.67 %
Brady Statistic AS VS Percent: 1.34 %
Brady Statistic RA Percent Paced: 27.27 %
Brady Statistic RV Percent Paced: 98.47 %
Date Time Interrogation Session: 20251217095534
Implantable Lead Connection Status: 753985
Implantable Lead Connection Status: 753985
Implantable Lead Implant Date: 20220523
Implantable Lead Implant Date: 20220523
Implantable Lead Location: 753859
Implantable Lead Location: 753860
Implantable Lead Model: 3830
Implantable Lead Model: 5076
Implantable Pulse Generator Implant Date: 20220523
Lead Channel Impedance Value: 266 Ohm
Lead Channel Impedance Value: 342 Ohm
Lead Channel Impedance Value: 399 Ohm
Lead Channel Impedance Value: 437 Ohm
Lead Channel Pacing Threshold Amplitude: 0.75 V
Lead Channel Pacing Threshold Amplitude: 1.25 V
Lead Channel Pacing Threshold Pulse Width: 0.4 ms
Lead Channel Pacing Threshold Pulse Width: 0.4 ms
Lead Channel Sensing Intrinsic Amplitude: 1.5 mV
Lead Channel Sensing Intrinsic Amplitude: 1.5 mV
Lead Channel Sensing Intrinsic Amplitude: 20.875 mV
Lead Channel Sensing Intrinsic Amplitude: 20.875 mV
Lead Channel Setting Pacing Amplitude: 1.5 V
Lead Channel Setting Pacing Amplitude: 2.5 V
Lead Channel Setting Pacing Pulse Width: 0.4 ms
Lead Channel Setting Sensing Sensitivity: 1.2 mV
Zone Setting Status: 755011
Zone Setting Status: 755011

## 2024-01-22 NOTE — Progress Notes (Signed)
 Remote PPM Transmission

## 2024-02-01 ENCOUNTER — Ambulatory Visit: Payer: Self-pay | Admitting: Internal Medicine

## 2024-04-21 ENCOUNTER — Ambulatory Visit: Payer: Medicare (Managed Care)

## 2024-06-14 ENCOUNTER — Ambulatory Visit: Payer: Medicare (Managed Care) | Admitting: Nurse Practitioner

## 2024-06-14 ENCOUNTER — Other Ambulatory Visit: Payer: Medicare (Managed Care)

## 2024-07-21 ENCOUNTER — Ambulatory Visit: Payer: Medicare (Managed Care)

## 2024-10-20 ENCOUNTER — Ambulatory Visit: Payer: Medicare (Managed Care)

## 2025-01-19 ENCOUNTER — Ambulatory Visit: Payer: Medicare (Managed Care)

## 2025-04-20 ENCOUNTER — Ambulatory Visit: Payer: Medicare (Managed Care)
